# Patient Record
Sex: Female | Born: 1935 | Race: White | Hispanic: No | Smoking: Former smoker
Health system: Southern US, Community
[De-identification: ages and names within clinical notes are randomized; demographics above are authoritative.]

## PROBLEM LIST (undated history)

## (undated) DIAGNOSIS — D099 Carcinoma in situ, unspecified: Secondary | ICD-10-CM

## (undated) DIAGNOSIS — K219 Gastro-esophageal reflux disease without esophagitis: Secondary | ICD-10-CM

## (undated) DIAGNOSIS — I10 Essential (primary) hypertension: Secondary | ICD-10-CM

## (undated) DIAGNOSIS — R350 Frequency of micturition: Secondary | ICD-10-CM

## (undated) DIAGNOSIS — G4700 Insomnia, unspecified: Secondary | ICD-10-CM

## (undated) DIAGNOSIS — N8110 Cystocele, unspecified: Secondary | ICD-10-CM

## (undated) DIAGNOSIS — C439 Malignant melanoma of skin, unspecified: Secondary | ICD-10-CM

## (undated) DIAGNOSIS — H269 Unspecified cataract: Secondary | ICD-10-CM

## (undated) DIAGNOSIS — M1990 Unspecified osteoarthritis, unspecified site: Secondary | ICD-10-CM

## (undated) DIAGNOSIS — N3020 Other chronic cystitis without hematuria: Secondary | ICD-10-CM

## (undated) DIAGNOSIS — C801 Malignant (primary) neoplasm, unspecified: Secondary | ICD-10-CM

## (undated) HISTORY — DX: Malignant (primary) neoplasm, unspecified: C80.1

## (undated) HISTORY — DX: Essential (primary) hypertension: I10

## (undated) HISTORY — DX: Other chronic cystitis without hematuria: N30.20

## (undated) HISTORY — PX: BREAST EXCISIONAL BIOPSY: SUR124

## (undated) HISTORY — DX: Frequency of micturition: R35.0

## (undated) HISTORY — PX: CATARACT EXTRACTION: SUR2

## (undated) HISTORY — DX: Carcinoma in situ, unspecified: D09.9

## (undated) HISTORY — DX: Malignant melanoma of skin, unspecified: C43.9

## (undated) HISTORY — DX: Insomnia, unspecified: G47.00

## (undated) HISTORY — DX: Cystocele, unspecified: N81.10

## (undated) HISTORY — DX: Unspecified osteoarthritis, unspecified site: M19.90

## (undated) HISTORY — DX: Gastro-esophageal reflux disease without esophagitis: K21.9

## (undated) HISTORY — PX: TUBAL LIGATION: SHX77

## (undated) HISTORY — PX: LAPAROSCOPIC VAGINAL HYSTERECTOMY WITH SALPINGO OOPHORECTOMY: SHX6681

## (undated) HISTORY — PX: APPENDECTOMY: SHX54

## (undated) HISTORY — DX: Unspecified cataract: H26.9

---

## 2011-03-11 DIAGNOSIS — C801 Malignant (primary) neoplasm, unspecified: Secondary | ICD-10-CM

## 2011-03-11 HISTORY — PX: BREAST SURGERY: SHX581

## 2011-03-11 HISTORY — DX: Malignant (primary) neoplasm, unspecified: C80.1

## 2011-12-16 DIAGNOSIS — N815 Vaginal enterocele: Secondary | ICD-10-CM | POA: Insufficient documentation

## 2012-03-18 DIAGNOSIS — N393 Stress incontinence (female) (male): Secondary | ICD-10-CM | POA: Insufficient documentation

## 2012-11-05 DIAGNOSIS — Z853 Personal history of malignant neoplasm of breast: Secondary | ICD-10-CM | POA: Insufficient documentation

## 2012-11-05 DIAGNOSIS — C50511 Malignant neoplasm of lower-outer quadrant of right female breast: Secondary | ICD-10-CM | POA: Insufficient documentation

## 2013-05-19 DIAGNOSIS — Z8742 Personal history of other diseases of the female genital tract: Secondary | ICD-10-CM | POA: Insufficient documentation

## 2016-04-16 DIAGNOSIS — M9901 Segmental and somatic dysfunction of cervical region: Secondary | ICD-10-CM | POA: Diagnosis not present

## 2016-04-16 DIAGNOSIS — M9902 Segmental and somatic dysfunction of thoracic region: Secondary | ICD-10-CM | POA: Diagnosis not present

## 2016-04-16 DIAGNOSIS — M47816 Spondylosis without myelopathy or radiculopathy, lumbar region: Secondary | ICD-10-CM | POA: Diagnosis not present

## 2016-04-16 DIAGNOSIS — M9903 Segmental and somatic dysfunction of lumbar region: Secondary | ICD-10-CM | POA: Diagnosis not present

## 2016-04-22 DIAGNOSIS — M205X1 Other deformities of toe(s) (acquired), right foot: Secondary | ICD-10-CM | POA: Diagnosis not present

## 2016-04-22 DIAGNOSIS — L918 Other hypertrophic disorders of the skin: Secondary | ICD-10-CM | POA: Diagnosis not present

## 2016-06-09 DIAGNOSIS — L820 Inflamed seborrheic keratosis: Secondary | ICD-10-CM | POA: Diagnosis not present

## 2016-06-09 DIAGNOSIS — D0439 Carcinoma in situ of skin of other parts of face: Secondary | ICD-10-CM | POA: Diagnosis not present

## 2016-06-09 DIAGNOSIS — D229 Melanocytic nevi, unspecified: Secondary | ICD-10-CM | POA: Diagnosis not present

## 2016-06-09 DIAGNOSIS — D485 Neoplasm of uncertain behavior of skin: Secondary | ICD-10-CM | POA: Diagnosis not present

## 2016-06-09 DIAGNOSIS — D225 Melanocytic nevi of trunk: Secondary | ICD-10-CM | POA: Diagnosis not present

## 2016-06-09 DIAGNOSIS — L309 Dermatitis, unspecified: Secondary | ICD-10-CM | POA: Diagnosis not present

## 2016-06-09 DIAGNOSIS — L821 Other seborrheic keratosis: Secondary | ICD-10-CM | POA: Diagnosis not present

## 2016-06-09 DIAGNOSIS — Z85828 Personal history of other malignant neoplasm of skin: Secondary | ICD-10-CM | POA: Diagnosis not present

## 2016-06-09 DIAGNOSIS — Z08 Encounter for follow-up examination after completed treatment for malignant neoplasm: Secondary | ICD-10-CM | POA: Diagnosis not present

## 2016-06-09 DIAGNOSIS — L570 Actinic keratosis: Secondary | ICD-10-CM | POA: Diagnosis not present

## 2016-06-24 DIAGNOSIS — L570 Actinic keratosis: Secondary | ICD-10-CM | POA: Diagnosis not present

## 2016-06-24 DIAGNOSIS — D485 Neoplasm of uncertain behavior of skin: Secondary | ICD-10-CM | POA: Diagnosis not present

## 2016-06-24 DIAGNOSIS — D0439 Carcinoma in situ of skin of other parts of face: Secondary | ICD-10-CM | POA: Diagnosis not present

## 2016-06-24 DIAGNOSIS — Z85828 Personal history of other malignant neoplasm of skin: Secondary | ICD-10-CM | POA: Diagnosis not present

## 2016-07-14 DIAGNOSIS — S1080XA Unspecified superficial injury of other specified part of neck, initial encounter: Secondary | ICD-10-CM | POA: Diagnosis not present

## 2016-07-14 DIAGNOSIS — Z09 Encounter for follow-up examination after completed treatment for conditions other than malignant neoplasm: Secondary | ICD-10-CM | POA: Diagnosis not present

## 2016-07-14 DIAGNOSIS — Z872 Personal history of diseases of the skin and subcutaneous tissue: Secondary | ICD-10-CM | POA: Diagnosis not present

## 2016-07-14 DIAGNOSIS — D485 Neoplasm of uncertain behavior of skin: Secondary | ICD-10-CM | POA: Diagnosis not present

## 2016-07-14 DIAGNOSIS — L570 Actinic keratosis: Secondary | ICD-10-CM | POA: Diagnosis not present

## 2016-08-07 DIAGNOSIS — M9901 Segmental and somatic dysfunction of cervical region: Secondary | ICD-10-CM | POA: Diagnosis not present

## 2016-08-07 DIAGNOSIS — M9903 Segmental and somatic dysfunction of lumbar region: Secondary | ICD-10-CM | POA: Diagnosis not present

## 2016-08-07 DIAGNOSIS — M47816 Spondylosis without myelopathy or radiculopathy, lumbar region: Secondary | ICD-10-CM | POA: Diagnosis not present

## 2016-08-07 DIAGNOSIS — M9902 Segmental and somatic dysfunction of thoracic region: Secondary | ICD-10-CM | POA: Diagnosis not present

## 2016-08-26 DIAGNOSIS — Z6822 Body mass index (BMI) 22.0-22.9, adult: Secondary | ICD-10-CM | POA: Diagnosis not present

## 2016-08-26 DIAGNOSIS — I10 Essential (primary) hypertension: Secondary | ICD-10-CM | POA: Diagnosis not present

## 2016-08-26 DIAGNOSIS — K209 Esophagitis, unspecified: Secondary | ICD-10-CM | POA: Diagnosis not present

## 2016-08-26 DIAGNOSIS — R194 Change in bowel habit: Secondary | ICD-10-CM | POA: Diagnosis not present

## 2016-10-06 DIAGNOSIS — L740 Miliaria rubra: Secondary | ICD-10-CM | POA: Diagnosis not present

## 2016-10-06 DIAGNOSIS — R21 Rash and other nonspecific skin eruption: Secondary | ICD-10-CM | POA: Diagnosis not present

## 2016-10-31 ENCOUNTER — Ambulatory Visit (INDEPENDENT_AMBULATORY_CARE_PROVIDER_SITE_OTHER): Payer: Medicare HMO | Admitting: Family Medicine

## 2016-10-31 ENCOUNTER — Encounter: Payer: Self-pay | Admitting: Family Medicine

## 2016-10-31 VITALS — BP 120/74 | HR 80 | Temp 98.1°F | Resp 16 | Ht 61.0 in | Wt 113.0 lb

## 2016-10-31 DIAGNOSIS — C50511 Malignant neoplasm of lower-outer quadrant of right female breast: Secondary | ICD-10-CM | POA: Diagnosis not present

## 2016-10-31 DIAGNOSIS — Z23 Encounter for immunization: Secondary | ICD-10-CM

## 2016-10-31 DIAGNOSIS — E559 Vitamin D deficiency, unspecified: Secondary | ICD-10-CM | POA: Diagnosis not present

## 2016-10-31 DIAGNOSIS — Z0000 Encounter for general adult medical examination without abnormal findings: Secondary | ICD-10-CM | POA: Diagnosis not present

## 2016-10-31 DIAGNOSIS — I10 Essential (primary) hypertension: Secondary | ICD-10-CM

## 2016-10-31 DIAGNOSIS — K219 Gastro-esophageal reflux disease without esophagitis: Secondary | ICD-10-CM | POA: Diagnosis not present

## 2016-10-31 MED ORDER — OMEPRAZOLE 20 MG PO CPDR: 20.0000 mg | DELAYED_RELEASE_CAPSULE | Freq: Every day | ORAL | 2 refills | Status: DC

## 2016-10-31 MED ORDER — HYDROCHLOROTHIAZIDE 25 MG PO TABS: 25.0000 mg | ORAL_TABLET | Freq: Every day | ORAL | 2 refills | Status: DC

## 2016-10-31 NOTE — Assessment & Plan Note (Signed)
Recheck vitamin  D level today.  

## 2016-10-31 NOTE — Assessment & Plan Note (Signed)
Annual screening mammogram scheduled for next months Results will be sent

## 2016-10-31 NOTE — Patient Instructions (Signed)
Preventive Care 65 Years and Older, Female Preventive care refers to lifestyle choices and visits with your health care provider that can promote health and wellness. What does preventive care include?  A yearly physical exam. This is also called an annual well check.  Dental exams once or twice a year.  Routine eye exams. Ask your health care provider how often you should have your eyes checked.  Personal lifestyle choices, including: ? Daily care of your teeth and gums. ? Regular physical activity. ? Eating a healthy diet. ? Avoiding tobacco and drug use. ? Limiting alcohol use. ? Practicing safe sex. ? Taking low-dose aspirin every day. ? Taking vitamin and mineral supplements as recommended by your health care provider. What happens during an annual well check? The services and screenings done by your health care provider during your annual well check will depend on your age, overall health, lifestyle risk factors, and family history of disease. Counseling Your health care provider may ask you questions about your:  Alcohol use.  Tobacco use.  Drug use.  Emotional well-being.  Home and relationship well-being.  Sexual activity.  Eating habits.  History of falls.  Memory and ability to understand (cognition).  Work and work environment.  Reproductive health.  Screening You may have the following tests or measurements:  Height, weight, and BMI.  Blood pressure.  Lipid and cholesterol levels. These may be checked every 5 years, or more frequently if you are over 50 years old.  Skin check.  Lung cancer screening. You may have this screening every year starting at age 55 if you have a 30-pack-year history of smoking and currently smoke or have quit within the past 15 years.  Fecal occult blood test (FOBT) of the stool. You may have this test every year starting at age 50.  Flexible sigmoidoscopy or colonoscopy. You may have a sigmoidoscopy every 5 years or  a colonoscopy every 10 years starting at age 50.  Hepatitis C blood test.  Hepatitis B blood test.  Sexually transmitted disease (STD) testing.  Diabetes screening. This is done by checking your blood sugar (glucose) after you have not eaten for a while (fasting). You may have this done every 1-3 years.  Bone density scan. This is done to screen for osteoporosis. You may have this done starting at age 65.  Mammogram. This may be done every 1-2 years. Talk to your health care provider about how often you should have regular mammograms.  Talk with your health care provider about your test results, treatment options, and if necessary, the need for more tests. Vaccines Your health care provider may recommend certain vaccines, such as:  Influenza vaccine. This is recommended every year.  Tetanus, diphtheria, and acellular pertussis (Tdap, Td) vaccine. You may need a Td booster every 10 years.  Varicella vaccine. You may need this if you have not been vaccinated.  Zoster vaccine. You may need this after age 60.  Measles, mumps, and rubella (MMR) vaccine. You may need at least one dose of MMR if you were born in 1957 or later. You may also need a second dose.  Pneumococcal 13-valent conjugate (PCV13) vaccine. One dose is recommended after age 65.  Pneumococcal polysaccharide (PPSV23) vaccine. One dose is recommended after age 65.  Meningococcal vaccine. You may need this if you have certain conditions.  Hepatitis A vaccine. You may need this if you have certain conditions or if you travel or work in places where you may be exposed to hepatitis   A.  Hepatitis B vaccine. You may need this if you have certain conditions or if you travel or work in places where you may be exposed to hepatitis B.  Haemophilus influenzae type b (Hib) vaccine. You may need this if you have certain conditions.  Talk to your health care provider about which screenings and vaccines you need and how often you  need them. This information is not intended to replace advice given to you by your health care provider. Make sure you discuss any questions you have with your health care provider. Document Released: 03/23/2015 Document Revised: 11/14/2015 Document Reviewed: 12/26/2014 Elsevier Interactive Patient Education  2017 [employer].

## 2016-10-31 NOTE — Assessment & Plan Note (Signed)
Well-controlled Continue current medications CMP and screening lipid panel today

## 2016-10-31 NOTE — Progress Notes (Signed)
Patient: Brandy Wong, Female    DOB: [birth year], 82 y.o.   MRN: [id number] Visit Date: 10/31/2016  Today's Provider: Lavon Paganini, MD   Chief Complaint  Patient presents with  . Medicare Wellness   Subjective:    Annual wellness visit Brandy Wong is a 82 y.o. female. She is here to establish care, as well as an annual wellness visit. She recently moved to the area from [place]. Her previous caregiver was Dr. Clyda Greener at [facility]. She has a H/O breast cancer, HTN, GERD. She feels well. She reports she is not exercising regularly, but she plans to start again. She does play golf. She reports she is sleeping poorly. She is having to use Tylenol PM to sleep, and when she does not take the medicine, she has difficulty falling asleep and premature awakening.   Last mammogram- 11/23/2015- BI-RADS 1. She has mammogram scheduled next month at [facility].  -----------------------------------------------------------   Review of Systems  Constitutional: Negative.   HENT: Negative.   Eyes: Negative.   Respiratory: Negative.   Cardiovascular: Negative.   Gastrointestinal: Negative.   Endocrine: Negative.   Genitourinary: Negative.   Musculoskeletal: Negative.   Skin: Negative.   Allergic/Immunologic: Negative.   Neurological: Negative.   Hematological: Negative.   Psychiatric/Behavioral: Negative.     Social History   Social History  . Marital status: Widowed    Spouse name: N/A  . Number of children: 2  . Years of education: some college   Occupational History  .  Retired   Social History Main Topics  . Smoking status: Former Smoker    Packs/day: 0.25    Years: 10.00    Quit date: 03/10/1987  . Smokeless tobacco: Never Used  . Alcohol use 4.2 oz/week    7 Glasses of wine per week  . Drug use: No  . Sexual activity: No   Other Topics Concern  . Not on file   Social History Narrative  . No narrative on file    Past Medical  History:  Diagnosis Date  . Cancer (HCC)    breast  . GERD (gastroesophageal reflux disease)   . Hypertension      Patient Active Problem List   Diagnosis Date Noted  . History of uterine prolapse 05/19/2013  . Malignant neoplasm of lower-outer quadrant of right female breast ([place]) 11/05/2012    Past Surgical History:  Procedure Laterality Date  . ABDOMINAL HYSTERECTOMY    . APPENDECTOMY    . BREAST SURGERY    . CESAREAN SECTION    . TUBAL LIGATION      Her family history includes Dementia in her mother; GER disease in her mother; Heart attack in her father; Stroke in her father.      Current Outpatient Prescriptions:  .  Cholecalciferol (VITAMIN D-1000 MAX ST) 1000 units tablet, Take 1 tablet by mouth daily., Disp: , Rfl:  .  hydrochlorothiazide (HYDRODIURIL) 25 MG tablet, Take 1 tablet by mouth daily., Disp: , Rfl:  .  MAGNESIUM PO, Take 1 capsule by mouth daily., Disp: , Rfl:  .  omeprazole (PRILOSEC) 20 MG capsule, Take 20 mg by mouth every other day. , Disp: , Rfl:  .  TURMERIC PO, Take 1 tablet by mouth daily., Disp: , Rfl:   Patient Care Team: Virginia Crews, MD as PCP - General (Family Medicine)     Objective:   Vitals: BP 120/74 (BP Location: Left Arm, Patient Position: Sitting, Cuff  Size: Normal)   Pulse 80   Temp 98.1 F (36.7 C) (Oral)   Resp 16   Ht 5\' 1"  (1.549 m)   Wt 113 lb (51.3 kg)   BMI 21.35 kg/m   Physical Exam  Constitutional: She is oriented to person, place, and time. She appears well-developed and well-nourished. No distress.  HENT:  Head: Normocephalic and atraumatic.  Right Ear: External ear normal.  Left Ear: External ear normal.  Nose: Nose normal.  Mouth/Throat: Oropharynx is clear and moist.  Eyes: Pupils are equal, round, and reactive to light. Conjunctivae and EOM are normal. No scleral icterus.  Neck: Neck supple. No thyromegaly present.  Cardiovascular: Normal rate, regular rhythm, normal heart sounds and intact  distal pulses.   No murmur heard. Pulmonary/Chest: Effort normal and breath sounds normal. No respiratory distress. She has no wheezes. She has no rales.  Abdominal: Soft. Bowel sounds are normal. She exhibits no distension. There is no tenderness. There is no rebound and no guarding.  Musculoskeletal: She exhibits no edema or deformity.  Lymphadenopathy:    She has no cervical adenopathy.  Neurological: She is alert and oriented to person, place, and time.  Skin: Skin is warm and dry. No rash noted.  Psychiatric: She has a normal mood and affect. Her behavior is normal.  Vitals reviewed.   Activities of Daily Living In your present state of health, do you have any difficulty performing the following activities: 10/31/2016  Hearing? N  Vision? N  Difficulty concentrating or making decisions? N  Walking or climbing stairs? N  Dressing or bathing? N  Doing errands, shopping? N    Fall Risk Assessment Fall Risk  10/31/2016  Falls in the past year? No     Depression Screen PHQ 2/9 Scores 10/31/2016  PHQ - 2 Score 0    Cognitive Testing - 6-CIT  Correct? Score   What year is it? yes 0 0 or 4  What month is it? yes 0 0 or 3  Memorize:    Pia Mau,  44,  [place],      What time is it? (within 1 hour) yes 0 0 or 3  Count backwards from 20 yes 0 0, 2, or 4  Name the months of the year yes 0 0, 2, or 4  Repeat name & address above no 4 0, 2, 4, 6, 8, or 10       TOTAL SCORE  4/28   Interpretation:  Normal  Normal (0-7) Abnormal (8-28)    Audit-C Alcohol Use Screening  Question Answer Points  How often do you have alcoholic drink? 1 times daily 1  On days you do drink alcohol, how many drinks do you typically consume? 1 1  How oftey will you drink 6 or more in a total? never 0  Total Score:  2   A score of 3 or more in women, and 4 or more in men indicates increased risk for alcohol abuse, EXCEPT if all of the points are from question 1.     Assessment &  Plan:     Annual Wellness Visit  Reviewed patient's Family Medical History Reviewed and updated list of patient's medical providers Assessment of cognitive impairment was done Assessed patient's functional ability Established a written schedule for health screening [place] Completed and Reviewed  Exercise Activities and Dietary recommendations Goals    None      Immunization History  Administered Date(s) Administered  .  Pneumococcal Conjugate-13 11/22/2014    Health Maintenance  Topic Date Due  . TETANUS/TDAP  01/12/1956  . DEXA SCAN  01/11/2002  . PNA vac Low Risk Adult (1 of 2 - PCV13) 01/11/2002  . INFLUENZA VACCINE  10/08/2016     Discussed health benefits of physical activity, and encouraged her to engage in regular exercise appropriate for her age and condition.   HTN (hypertension) Well-controlled Continue current medications CMP and screening lipid panel today  GERD (gastroesophageal reflux disease) Well-controlled on omeprazole 20 mg daily (sometimes only every other day) No changes in medications at this time Warned about potential for osteoporosis and renal dysfunction on long-term PPI  Avitaminosis D Recheck vitamin D level today  Malignant neoplasm of lower-outer quadrant of right female breast [facility]) Annual screening mammogram scheduled for next months Results will be sent    Influenza vaccine given today  ------------------------------------------------------------------------------------------------------------  The entirety of the information documented in the History of Present Illness, Review of Systems and Physical Exam were personally obtained by me. Portions of this information were initially documented by Raquel Sarna Ratchford, CMA and reviewed by me for thoroughness and accuracy.    Lavon Paganini, MD  [facility]

## 2016-10-31 NOTE — Assessment & Plan Note (Signed)
Well-controlled on omeprazole 20 mg daily (sometimes only every other day) No changes in medications at this time Warned about potential for osteoporosis and renal dysfunction on long-term PPI

## 2016-11-01 LAB — COMPREHENSIVE METABOLIC PANEL
ALT: 12 IU/L (ref 0–32)
AST: 24 IU/L (ref 0–40)
Albumin/Globulin Ratio: 1.8 (ref 1.2–2.2)
Albumin: 4.5 g/dL (ref 3.5–4.8)
Alkaline Phosphatase: 83 IU/L (ref 39–117)
BUN/Creatinine Ratio: 15 (ref 12–28)
BUN: 9 mg/dL (ref 8–27)
Bilirubin Total: 0.4 mg/dL (ref 0.0–1.2)
CO2: 27 mmol/L (ref 20–29)
Calcium: 10.2 mg/dL (ref 8.7–10.3)
Chloride: 94 mmol/L — ABNORMAL LOW (ref 96–106)
Creatinine, Ser: 0.62 mg/dL (ref 0.57–1.00)
GFR calc Af Amer: 99 mL/min/{1.73_m2} (ref >59)
GFR calc non Af Amer: 86 mL/min/{1.73_m2} (ref >59)
Globulin, Total: 2.5 g/dL (ref 1.5–4.5)
Glucose: 95 mg/dL (ref 65–99)
Potassium: 4.4 mmol/L (ref 3.5–5.2)
Sodium: 137 mmol/L (ref 134–144)
Total Protein: 7 g/dL (ref 6.0–8.5)

## 2016-11-01 LAB — LIPID PANEL
CHOL/HDL RATIO: 2.7 ratio (ref 0.0–4.4)
CHOLESTEROL TOTAL: 286 mg/dL — AB (ref 100–199)
HDL: 107 mg/dL (ref >39)
LDL CALC: 162 mg/dL — AB (ref 0–99)
TRIGLYCERIDES: 87 mg/dL (ref 0–149)
VLDL Cholesterol Cal: 17 mg/dL (ref 5–40)

## 2016-11-01 LAB — CBC WITH DIFFERENTIAL/PLATELET
Basophils Absolute: 0.1 10*3/uL (ref 0.0–0.2)
Basos: 1 %
EOS (ABSOLUTE): 0.1 10*3/uL (ref 0.0–0.4)
Eos: 2 %
Hematocrit: 43.2 % (ref 34.0–46.6)
Hemoglobin: 14.4 g/dL (ref 11.1–15.9)
Immature Grans (Abs): 0 10*3/uL (ref 0.0–0.1)
Immature Granulocytes: 0 %
Lymphocytes Absolute: 1.6 10*3/uL (ref 0.7–3.1)
Lymphs: 24 %
MCH: 31 pg (ref 26.6–33.0)
MCHC: 33.3 g/dL (ref 31.5–35.7)
MCV: 93 fL (ref 79–97)
Monocytes Absolute: 0.7 10*3/uL (ref 0.1–0.9)
Monocytes: 10 %
Neutrophils Absolute: 4.3 10*3/uL (ref 1.4–7.0)
Neutrophils: 63 %
Platelets: 417 10*3/uL — ABNORMAL HIGH (ref 150–379)
RBC: 4.65 x10E6/uL (ref 3.77–5.28)
RDW: 13.7 % (ref 12.3–15.4)
WBC: 6.8 10*3/uL (ref 3.4–10.8)

## 2016-11-01 LAB — VITAMIN D 25 HYDROXY (VIT D DEFICIENCY, FRACTURES): Vit D, 25-Hydroxy: 38.2 ng/mL (ref 30.0–100.0)

## 2016-11-03 ENCOUNTER — Telehealth: Payer: Self-pay

## 2016-11-03 NOTE — Telephone Encounter (Signed)
-----   Message from Virginia Crews, MD sent at 11/03/2016 10:49 AM EDT ----- Cholesterol is high, but good cholesterol is also quite high which is protective.  10 yr risk of heart attack/stroke is 29.9%.  This means we should at least consider a statin medicine for cholesterol.  The 10 year risk only decreases to 22.4% even with a statin, however.  This is mostly driven by age.  I am happy to hold off on one or start one and will leave decision up to the patient.    Other labs are great, including electrolytes, kidney function, liver function, blood counts, and Vit D levels.  Virginia Crews, MD, MPH [facility] 11/03/2016 10:49 AM

## 2016-11-03 NOTE — Telephone Encounter (Signed)
Tried calling pt. No answer and no VM. Will try again later.

## 2016-11-03 NOTE — Telephone Encounter (Signed)
Pt advised as below, and declines medication at this time.

## 2016-12-11 DIAGNOSIS — R922 Inconclusive mammogram: Secondary | ICD-10-CM | POA: Diagnosis not present

## 2016-12-11 DIAGNOSIS — Z1231 Encounter for screening mammogram for malignant neoplasm of breast: Secondary | ICD-10-CM | POA: Diagnosis not present

## 2016-12-11 DIAGNOSIS — M9903 Segmental and somatic dysfunction of lumbar region: Secondary | ICD-10-CM | POA: Diagnosis not present

## 2016-12-11 DIAGNOSIS — Z853 Personal history of malignant neoplasm of breast: Secondary | ICD-10-CM | POA: Diagnosis not present

## 2016-12-11 DIAGNOSIS — M9902 Segmental and somatic dysfunction of thoracic region: Secondary | ICD-10-CM | POA: Diagnosis not present

## 2016-12-11 DIAGNOSIS — M9901 Segmental and somatic dysfunction of cervical region: Secondary | ICD-10-CM | POA: Diagnosis not present

## 2016-12-11 DIAGNOSIS — M47816 Spondylosis without myelopathy or radiculopathy, lumbar region: Secondary | ICD-10-CM | POA: Diagnosis not present

## 2016-12-11 LAB — HM MAMMOGRAPHY

## 2016-12-15 ENCOUNTER — Encounter: Payer: Self-pay | Admitting: Family Medicine

## 2016-12-15 NOTE — Progress Notes (Signed)
a 

## 2016-12-24 DIAGNOSIS — M9902 Segmental and somatic dysfunction of thoracic region: Secondary | ICD-10-CM | POA: Diagnosis not present

## 2016-12-24 DIAGNOSIS — M47816 Spondylosis without myelopathy or radiculopathy, lumbar region: Secondary | ICD-10-CM | POA: Diagnosis not present

## 2016-12-24 DIAGNOSIS — M9903 Segmental and somatic dysfunction of lumbar region: Secondary | ICD-10-CM | POA: Diagnosis not present

## 2016-12-24 DIAGNOSIS — M9901 Segmental and somatic dysfunction of cervical region: Secondary | ICD-10-CM | POA: Diagnosis not present

## 2016-12-25 DIAGNOSIS — I10 Essential (primary) hypertension: Secondary | ICD-10-CM | POA: Diagnosis not present

## 2016-12-25 DIAGNOSIS — H524 Presbyopia: Secondary | ICD-10-CM | POA: Diagnosis not present

## 2017-01-19 ENCOUNTER — Ambulatory Visit (INDEPENDENT_AMBULATORY_CARE_PROVIDER_SITE_OTHER): Payer: Medicare HMO | Admitting: Family Medicine

## 2017-01-19 ENCOUNTER — Encounter: Payer: Self-pay | Admitting: Family Medicine

## 2017-01-19 VITALS — BP 136/80 | HR 84 | Temp 98.3°F | Resp 16 | Wt 117.0 lb

## 2017-01-19 DIAGNOSIS — M79602 Pain in left arm: Secondary | ICD-10-CM | POA: Diagnosis not present

## 2017-01-19 DIAGNOSIS — Z23 Encounter for immunization: Secondary | ICD-10-CM

## 2017-01-19 DIAGNOSIS — S81802A Unspecified open wound, left lower leg, initial encounter: Secondary | ICD-10-CM

## 2017-01-19 DIAGNOSIS — W548XXA Other contact with dog, initial encounter: Secondary | ICD-10-CM | POA: Diagnosis not present

## 2017-01-19 DIAGNOSIS — L03116 Cellulitis of left lower limb: Secondary | ICD-10-CM | POA: Diagnosis not present

## 2017-01-19 MED ORDER — DOXYCYCLINE HYCLATE 100 MG PO TABS
100.0000 mg | ORAL_TABLET | Freq: Two times a day (BID) | ORAL | 0 refills | Status: DC
Start: 2017-01-19 — End: 2017-11-03

## 2017-01-19 NOTE — Patient Instructions (Signed)

## 2017-01-19 NOTE — Progress Notes (Signed)
Patient: Brandy Wong Female    DOB: 1935/03/01   82 y.o.   MRN: [id number] Visit Date: 01/19/2017  Today's Provider: Lavon Paganini, MD   Chief Complaint  Patient presents with  . Arm Pain  . Wound Check   Subjective:    Arm Pain   Injury mechanism: picking up table 2 months ago. The pain is present in the left elbow. The quality of the pain is described as aching. Radiates to: left shoulder. The pain is at a severity of 1/10. The pain is mild. The pain has been fluctuating since the incident. Pertinent negatives include no chest pain, muscle weakness, numbness or tingling. The symptoms are aggravated by movement. She has tried NSAIDs, heat and ice (chiropractic manipulation) for the symptoms. The treatment provided mild relief.  Wound Check  There has been no drainage from the wound. The redness has improved. There is no swelling present. There is no pain present.  Wound located on LLE. Occurred after dog scratch 2 weeks ago. Denies pain.  Dog is hers and fully vaccinated     Allergies  Allergen Reactions  . Amoxicillin Rash     Current Outpatient Medications:  .  Cholecalciferol (VITAMIN D-1000 MAX ST) 1000 units tablet, Take 1 tablet by mouth daily., Disp: , Rfl:  .  hydrochlorothiazide (HYDRODIURIL) 25 MG tablet, Take 1 tablet (25 mg total) by mouth daily., Disp: 90 tablet, Rfl: 2 .  MAGNESIUM PO, Take 1 capsule by mouth daily., Disp: , Rfl:  .  omeprazole (PRILOSEC) 20 MG capsule, Take 1 capsule (20 mg total) by mouth daily., Disp: 90 capsule, Rfl: 2 .  TURMERIC PO, Take 1 tablet by mouth daily., Disp: , Rfl:    Review of Systems  Constitutional: Negative.   HENT: Negative.   Respiratory: Negative.   Cardiovascular: Negative for chest pain, palpitations and leg swelling.  Musculoskeletal: Positive for arthralgias. Negative for joint swelling.  Skin: Negative.   Neurological: Negative for tingling and numbness.    Social History   Tobacco Use  .  Smoking status: Former Smoker    Packs/day: 0.15    Years: 10.00    Pack years: 1.50    Last attempt to quit: 03/10/1987    Years since quitting: 29.8  . Smokeless tobacco: Never Used  Substance Use Topics  . Alcohol use: Yes    Alcohol/week: 4.2 oz    Types: 7 Glasses of wine per week   Objective:   BP 136/80 (BP Location: Right Arm, Patient Position: Sitting, Cuff Size: Normal)   Pulse 84   Temp 98.3 F (36.8 C) (Oral)   Resp 16   Wt 117 lb (53.1 kg)   BMI 22.11 kg/m  Vitals:   01/19/17 1116  BP: 136/80  Pulse: 84  Resp: 16  Temp: 98.3 F (36.8 C)  TempSrc: Oral  Weight: 117 lb (53.1 kg)     Physical Exam  Constitutional: She is oriented to person, place, and time. She appears well-developed and well-nourished. No distress.  HENT:  Head: Normocephalic and atraumatic.  Right Ear: External ear normal.  Left Ear: External ear normal.  Nose: Nose normal.  Mouth/Throat: Oropharynx is clear and moist.  Eyes: Conjunctivae and EOM are normal. Pupils are equal, round, and reactive to light. No scleral icterus.  Neck: Neck supple. No thyromegaly present.  Cardiovascular: Normal rate, regular rhythm, normal heart sounds and intact distal pulses.  No murmur heard. Pulmonary/Chest: Effort normal and breath sounds normal.  No respiratory distress. She has no wheezes. She has no rales.  Musculoskeletal: She exhibits no edema.  L shoulder: Full ROM, no TTP over bony landmarks. Deformity noted over L posterior upper arm that is TTP. Strength and sensation itnact. No signs of impingement  Lymphadenopathy:    She has no cervical adenopathy.  Neurological: She is alert and oriented to person, place, and time.  Skin:  Scab over L medial calf with surrounding erythema, no fluctuance/induration  Vitals reviewed.       Assessment & Plan:     1. Left arm pain - noted deformity of soft tissue on exam.  Likely this represents tendon tear of one of triceps tendons - discussed Ortho  referral for further management, but patient prefers to wait as she would not want intervention at this time - discussed MRI to further evaluate, but she does not think this would change her management, as she is currently functioning well  2. Dog scratch - slow to heal wound 2/2 dog scratch with surrounding cellulitis - Td vaccine greater than or equal to 7yo preservative free IM  3. Wound of left lower extremity, initial encounter - Td vaccine greater than or equal to 7yo preservative free IM  4. Need for pneumococcal vaccination - Pneumococcal polysaccharide vaccine 23-valent greater than or equal to 2yo subcutaneous/IM  5. Cellulitis of left lower extremity - appears to have surrounding cellulitis - treat with 7 day course of doxycycline - return precautions discussed   Meds ordered this encounter  Medications  . doxycycline (VIBRA-TABS) 100 MG tablet    Sig: Take 1 tablet (100 mg total) 2 (two) times daily by mouth.    Dispense:  14 tablet    Refill:  0       Return if symptoms worsen or fail to improve.  The entirety of the information documented in the History of Present Illness, Review of Systems and Physical Exam were personally obtained by me. Portions of this information were initially documented by Raquel Sarna Ratchford, CMA and reviewed by me for thoroughness and accuracy.     Lavon Paganini, MD  [facility]

## 2017-03-09 DIAGNOSIS — X32XXXA Exposure to sunlight, initial encounter: Secondary | ICD-10-CM | POA: Diagnosis not present

## 2017-03-09 DIAGNOSIS — D225 Melanocytic nevi of trunk: Secondary | ICD-10-CM | POA: Diagnosis not present

## 2017-03-09 DIAGNOSIS — D2262 Melanocytic nevi of left upper limb, including shoulder: Secondary | ICD-10-CM | POA: Diagnosis not present

## 2017-03-09 DIAGNOSIS — L570 Actinic keratosis: Secondary | ICD-10-CM | POA: Diagnosis not present

## 2017-03-09 DIAGNOSIS — D2261 Melanocytic nevi of right upper limb, including shoulder: Secondary | ICD-10-CM | POA: Diagnosis not present

## 2017-03-09 DIAGNOSIS — Z85828 Personal history of other malignant neoplasm of skin: Secondary | ICD-10-CM | POA: Diagnosis not present

## 2017-03-18 DIAGNOSIS — M7552 Bursitis of left shoulder: Secondary | ICD-10-CM | POA: Diagnosis not present

## 2017-03-18 DIAGNOSIS — M7542 Impingement syndrome of left shoulder: Secondary | ICD-10-CM | POA: Diagnosis not present

## 2017-04-06 DIAGNOSIS — M7542 Impingement syndrome of left shoulder: Secondary | ICD-10-CM | POA: Diagnosis not present

## 2017-04-06 DIAGNOSIS — R609 Edema, unspecified: Secondary | ICD-10-CM | POA: Diagnosis not present

## 2017-04-06 DIAGNOSIS — M25512 Pain in left shoulder: Secondary | ICD-10-CM | POA: Diagnosis not present

## 2017-04-15 DIAGNOSIS — M7542 Impingement syndrome of left shoulder: Secondary | ICD-10-CM | POA: Diagnosis not present

## 2017-04-15 DIAGNOSIS — M25512 Pain in left shoulder: Secondary | ICD-10-CM | POA: Diagnosis not present

## 2017-04-22 DIAGNOSIS — M7542 Impingement syndrome of left shoulder: Secondary | ICD-10-CM | POA: Diagnosis not present

## 2017-04-22 DIAGNOSIS — M25512 Pain in left shoulder: Secondary | ICD-10-CM | POA: Diagnosis not present

## 2017-07-28 DIAGNOSIS — M5134 Other intervertebral disc degeneration, thoracic region: Secondary | ICD-10-CM | POA: Diagnosis not present

## 2017-07-28 DIAGNOSIS — M5033 Other cervical disc degeneration, cervicothoracic region: Secondary | ICD-10-CM | POA: Diagnosis not present

## 2017-07-28 DIAGNOSIS — M9902 Segmental and somatic dysfunction of thoracic region: Secondary | ICD-10-CM | POA: Diagnosis not present

## 2017-07-28 DIAGNOSIS — M9901 Segmental and somatic dysfunction of cervical region: Secondary | ICD-10-CM | POA: Diagnosis not present

## 2017-07-30 DIAGNOSIS — M9901 Segmental and somatic dysfunction of cervical region: Secondary | ICD-10-CM | POA: Diagnosis not present

## 2017-07-30 DIAGNOSIS — M5134 Other intervertebral disc degeneration, thoracic region: Secondary | ICD-10-CM | POA: Diagnosis not present

## 2017-07-30 DIAGNOSIS — M9902 Segmental and somatic dysfunction of thoracic region: Secondary | ICD-10-CM | POA: Diagnosis not present

## 2017-07-30 DIAGNOSIS — M5033 Other cervical disc degeneration, cervicothoracic region: Secondary | ICD-10-CM | POA: Diagnosis not present

## 2017-08-05 DIAGNOSIS — M5134 Other intervertebral disc degeneration, thoracic region: Secondary | ICD-10-CM | POA: Diagnosis not present

## 2017-08-05 DIAGNOSIS — M9901 Segmental and somatic dysfunction of cervical region: Secondary | ICD-10-CM | POA: Diagnosis not present

## 2017-08-05 DIAGNOSIS — M5033 Other cervical disc degeneration, cervicothoracic region: Secondary | ICD-10-CM | POA: Diagnosis not present

## 2017-08-05 DIAGNOSIS — M9902 Segmental and somatic dysfunction of thoracic region: Secondary | ICD-10-CM | POA: Diagnosis not present

## 2017-08-17 DIAGNOSIS — M5134 Other intervertebral disc degeneration, thoracic region: Secondary | ICD-10-CM | POA: Diagnosis not present

## 2017-08-17 DIAGNOSIS — M9901 Segmental and somatic dysfunction of cervical region: Secondary | ICD-10-CM | POA: Diagnosis not present

## 2017-08-17 DIAGNOSIS — M5033 Other cervical disc degeneration, cervicothoracic region: Secondary | ICD-10-CM | POA: Diagnosis not present

## 2017-08-17 DIAGNOSIS — M9902 Segmental and somatic dysfunction of thoracic region: Secondary | ICD-10-CM | POA: Diagnosis not present

## 2017-08-21 DIAGNOSIS — M9901 Segmental and somatic dysfunction of cervical region: Secondary | ICD-10-CM | POA: Diagnosis not present

## 2017-08-21 DIAGNOSIS — M5134 Other intervertebral disc degeneration, thoracic region: Secondary | ICD-10-CM | POA: Diagnosis not present

## 2017-08-21 DIAGNOSIS — M9902 Segmental and somatic dysfunction of thoracic region: Secondary | ICD-10-CM | POA: Diagnosis not present

## 2017-08-21 DIAGNOSIS — M5033 Other cervical disc degeneration, cervicothoracic region: Secondary | ICD-10-CM | POA: Diagnosis not present

## 2017-10-13 DIAGNOSIS — M216X2 Other acquired deformities of left foot: Secondary | ICD-10-CM | POA: Diagnosis not present

## 2017-10-13 DIAGNOSIS — M216X1 Other acquired deformities of right foot: Secondary | ICD-10-CM | POA: Diagnosis not present

## 2017-10-13 DIAGNOSIS — L909 Atrophic disorder of skin, unspecified: Secondary | ICD-10-CM | POA: Diagnosis not present

## 2017-11-03 ENCOUNTER — Ambulatory Visit (INDEPENDENT_AMBULATORY_CARE_PROVIDER_SITE_OTHER): Payer: Medicare HMO | Admitting: Family Medicine

## 2017-11-03 ENCOUNTER — Encounter: Payer: Self-pay | Admitting: Family Medicine

## 2017-11-03 ENCOUNTER — Ambulatory Visit (INDEPENDENT_AMBULATORY_CARE_PROVIDER_SITE_OTHER): Payer: Medicare HMO

## 2017-11-03 VITALS — BP 132/76 | HR 91 | Temp 98.6°F | Ht 61.0 in | Wt 118.2 lb

## 2017-11-03 DIAGNOSIS — Z0000 Encounter for general adult medical examination without abnormal findings: Secondary | ICD-10-CM | POA: Diagnosis not present

## 2017-11-03 DIAGNOSIS — E559 Vitamin D deficiency, unspecified: Secondary | ICD-10-CM

## 2017-11-03 DIAGNOSIS — Z13820 Encounter for screening for osteoporosis: Secondary | ICD-10-CM

## 2017-11-03 DIAGNOSIS — I10 Essential (primary) hypertension: Secondary | ICD-10-CM

## 2017-11-03 DIAGNOSIS — K219 Gastro-esophageal reflux disease without esophagitis: Secondary | ICD-10-CM

## 2017-11-03 DIAGNOSIS — E7800 Pure hypercholesterolemia, unspecified: Secondary | ICD-10-CM

## 2017-11-03 MED ORDER — OMEPRAZOLE 20 MG PO CPDR: 20.0000 mg | DELAYED_RELEASE_CAPSULE | Freq: Every day | ORAL | 3 refills | Status: DC

## 2017-11-03 MED ORDER — HYDROCHLOROTHIAZIDE 25 MG PO TABS: 25.0000 mg | ORAL_TABLET | Freq: Every day | ORAL | 3 refills | Status: DC

## 2017-11-03 NOTE — Progress Notes (Signed)
Subjective:   Brandy Wong is a 83 y.o. female who presents for Medicare Annual (Subsequent) preventive examination.  Review of Systems:  N/A  Cardiac Risk Factors include: advanced age (>28men, >53 women);hypertension     Objective:     Vitals: BP 132/76 (BP Location: Right Arm)   Pulse 91   Temp 98.6 F (37 C) (Oral)   Ht 5\' 1"  (1.549 m)   Wt 118 lb 3.2 oz (53.6 kg)   BMI 22.33 kg/m   Body mass index is 22.33 kg/m.  Advanced Directives 11/03/2017 10/31/2016  Does Patient Have a Medical Advance Directive? Yes Yes  Type of Paramedic of [place];Living will [place];Living will  Copy of [place] in Chart? No - copy requested -    Tobacco Social History   Tobacco Use  Smoking Status Former Smoker  . Packs/day: 0.15  . Years: 10.00  . Pack years: 1.50  . Last attempt to quit: 03/10/1987  . Years since quitting: 30.6  Smokeless Tobacco Never Used     Counseling given: Not Answered   Clinical Intake:  Pre-visit preparation completed: Yes  Pain : No/denies pain Pain Score: 0-No pain(Has knee pain with movement.)     Nutritional Status: BMI of 19-24  Normal Nutritional Risks: None Diabetes: No  How often do you need to have someone help you when you read instructions, pamphlets, or other written materials from your doctor or pharmacy?: 1 - Never  Interpreter Needed?: No  Information entered by :: [facility], LPN  Past Medical History:  Diagnosis Date  . Cancer [facility]) 2013   R breast, s/p radiation and lumpectomy  . GERD (gastroesophageal reflux disease)   . Hypertension    Past Surgical History:  Procedure Laterality Date  . APPENDECTOMY    . BREAST SURGERY  2013   lumpectomy  . LAPAROSCOPIC VAGINAL HYSTERECTOMY WITH SALPINGO OOPHORECTOMY    . TUBAL LIGATION     Family History  Problem Relation Age of Onset  . GER disease Mother   . Dementia Mother 13  . Stroke Father   . Heart  attack Father    Social History   Socioeconomic History  . Marital status: Widowed    Spouse name: Not on file  . Number of children: 2  . Years of education: some college  . Highest education level: Some college, no degree  Occupational History    Employer: RETIRED  Social Needs  . Financial resource strain: Not hard at all  . Food insecurity:    Worry: Never true    Inability: Never true  . Transportation needs:    Medical: No    Non-medical: No  Tobacco Use  . Smoking status: Former Smoker    Packs/day: 0.15    Years: 10.00    Pack years: 1.50    Last attempt to quit: 03/10/1987    Years since quitting: 30.6  . Smokeless tobacco: Never Used  Substance and Sexual Activity  . Alcohol use: Yes    Alcohol/week: 7.0 standard drinks    Types: 7 Glasses of wine per week  . Drug use: No  . Sexual activity: Never  Lifestyle  . Physical activity:    Days per week: Not on file    Minutes per session: Not on file  . Stress: Not at all  Relationships  . Social connections:    Talks on phone: Not on file    Gets together: Not on file  Attends religious service: Not on file    Active member of club or organization: Not on file    Attends meetings of clubs or organizations: Not on file    Relationship status: Not on file  Other Topics Concern  . Not on file  Social History Narrative  . Not on file    Outpatient Encounter Medications as of 11/03/2017  Medication Sig  . Cholecalciferol (VITAMIN D-1000 MAX ST) 1000 units tablet Take 1 tablet by mouth daily.  . hydrochlorothiazide (HYDRODIURIL) 25 MG tablet Take 1 tablet (25 mg total) by mouth daily.  Marland Kitchen MAGNESIUM PO Take 1 capsule by mouth daily.  Marland Kitchen omeprazole (PRILOSEC) 20 MG capsule Take 1 capsule (20 mg total) by mouth daily. (Patient taking differently: Take 20 mg by mouth every other day. )  . Polyethyl Glycol-Propyl Glycol (SYSTANE OP) Apply 1 drop to eye 4 (four) times daily as needed.  . TURMERIC PO Take 1 tablet  by mouth daily.  Marland Kitchen doxycycline (VIBRA-TABS) 100 MG tablet Take 1 tablet (100 mg total) 2 (two) times daily by mouth. (Patient not taking: Reported on 11/03/2017)   No facility-administered encounter medications on file as of 11/03/2017.     Activities of Daily Living In your present state of health, do you have any difficulty performing the following activities: 11/03/2017  Hearing? N  Vision? Y  Comment Due to dry eyes.   Difficulty concentrating or making decisions? N  Walking or climbing stairs? Y  Comment Due to knee pains and weakness in legs.   Dressing or bathing? N  Doing errands, shopping? N  Preparing Food and eating ? N  Using the Toilet? N  In the past six months, have you accidently leaked urine? N  Do you have problems with loss of bowel control? N  Managing your Medications? N  Managing your Finances? N  Housekeeping or managing your Housekeeping? N  Some recent data might be hidden    Patient Care Team: Virginia Crews, MD as PCP - General (Family Medicine)    Assessment:   This is a routine wellness examination for Brandy Wong.  Exercise Activities and Dietary recommendations Current Exercise Habits: Home exercise routine, Type of exercise: yoga;stretching;Other - see comments(plays golf), Time (Minutes): 25, Frequency (Times/Week): 7, Weekly Exercise (Minutes/Week): 175, Intensity: Mild, Exercise limited by: None identified  Goals    . DIET - INCREASE WATER INTAKE     Recommend increasing water intake to 3 glasses a day.        Fall Risk Fall Risk  11/03/2017 10/31/2016  Falls in the past year? No No   Is the patient's home free of loose throw rugs in walkways, pet beds, electrical cords, etc?   yes      Grab bars in the bathroom? yes      Handrails on the stairs?   no      Adequate lighting?   yes  Timed Get Up and Go performed: N/A  Depression Screen PHQ 2/9 Scores 11/03/2017 11/03/2017 10/31/2016  PHQ - 2 Score 0 0 0  PHQ- 9 Score 0 - -      Cognitive Function:      6CIT Screen 11/03/2017  What Year? 0 points  What month? 0 points  What time? 0 points  Count back from 20 0 points  Months in reverse 0 points  Repeat phrase 0 points  Total Score 0    Immunization History  Administered Date(s) Administered  . Influenza, High Dose Seasonal PF  10/31/2016  . Pneumococcal Conjugate-13 11/22/2014  . Pneumococcal Polysaccharide-23 01/19/2017  . Td 01/19/2017    Qualifies for Shingles Vaccine? Due for Shingles vaccine. Declined my offer to administer today. Education has been provided regarding the importance of this vaccine. Pt has been advised to call her insurance company to determine her out of pocket expense. Advised she may also receive this vaccine at her local pharmacy or Health Dept. Verbalized acceptance and understanding.  Screening Tests Health Maintenance  Topic Date Due  . DEXA SCAN  01/11/2002  . INFLUENZA VACCINE  10/08/2017  . TETANUS/TDAP  01/20/2027  . PNA vac Low Risk Adult  Completed    Cancer Screenings: Lung: Low Dose CT Chest recommended if Age 59-80 years, 30 pack-year currently smoking OR have quit w/in 15years. Patient does not qualify. Breast:  Up to date on Mammogram? Yes   Up to date of Bone Density/Dexa? No Colorectal: N/A  Additional Screenings:  Hepatitis C Screening: N/A     Plan:  I have personally reviewed and addressed the Medicare Annual Wellness questionnaire and have noted the following in the patient's chart:  A. Medical and social history B. Use of alcohol, tobacco or illicit drugs  C. Current medications and supplements D. Functional ability and status E.  Nutritional status F.  Physical activity G. Advance directives H. List of other physicians I.  Hospitalizations, surgeries, and ER visits in previous 12 months J.  [place] such as hearing and vision if needed, cognitive and depression L. Referrals and appointments - none  In addition, I have  reviewed and discussed with patient certain preventive protocols, quality metrics, and best practice recommendations. A written personalized care plan for preventive services as well as general preventive health recommendations were provided to patient.  See attached scanned questionnaire for additional information.   Signed,  Fabio Neighbors, LPN Nurse Health Advisor   Nurse Recommendations: DEXA referral sent today.

## 2017-11-03 NOTE — Patient Instructions (Addendum)
Call and schedule a flu shot in a few weeks   Preventive Care 81 Years and Older, Female Preventive care refers to lifestyle choices and visits with your health care provider that can promote health and wellness. What does preventive care include?  A yearly physical exam. This is also called an annual well check.  Dental exams once or twice a year.  Routine eye exams. Ask your health care provider how often you should have your eyes checked.  Personal lifestyle choices, including: ? Daily care of your teeth and gums. ? Regular physical activity. ? Eating a healthy diet. ? Avoiding tobacco and drug use. ? Limiting alcohol use. ? Practicing safe sex. ? Taking low-dose aspirin every day. ? Taking vitamin and mineral supplements as recommended by your health care provider. What happens during an annual well check? The services and screenings done by your health care provider during your annual well check will depend on your age, overall health, lifestyle risk factors, and family history of disease. Counseling Your health care provider may ask you questions about your:  Alcohol use.  Tobacco use.  Drug use.  Emotional well-being.  Home and relationship well-being.  Sexual activity.  Eating habits.  History of falls.  Memory and ability to understand (cognition).  Work and work Statistician.  Reproductive health.  Screening You may have the following tests or measurements:  Height, weight, and BMI.  Blood pressure.  Lipid and cholesterol levels. These may be checked every 5 years, or more frequently if you are over 81 years old.  Skin check.  Lung cancer screening. You may have this screening every year starting at age 18 if you have a 30-pack-year history of smoking and currently smoke or have quit within the past 15 years.  Fecal occult blood test (FOBT) of the stool. You may have this test every year starting at age 43.  Flexible sigmoidoscopy or  colonoscopy. You may have a sigmoidoscopy every 5 years or a colonoscopy every 10 years starting at age 18.  Hepatitis C blood test.  Hepatitis B blood test.  Sexually transmitted disease (STD) testing.  Diabetes screening. This is done by checking your blood sugar (glucose) after you have not eaten for a while (fasting). You may have this done every 1-3 years.  Bone density scan. This is done to screen for osteoporosis. You may have this done starting at age 29.  Mammogram. This may be done every 1-2 years. Talk to your health care provider about how often you should have regular mammograms.  Talk with your health care provider about your test results, treatment options, and if necessary, the need for more tests. Vaccines Your health care provider may recommend certain vaccines, such as:  Influenza vaccine. This is recommended every year.  Tetanus, diphtheria, and acellular pertussis (Tdap, Td) vaccine. You may need a Td booster every 10 years.  Varicella vaccine. You may need this if you have not been vaccinated.  Zoster vaccine. You may need this after age 24.  Measles, mumps, and rubella (MMR) vaccine. You may need at least one dose of MMR if you were born in 1957 or later. You may also need a second dose.  Pneumococcal 13-valent conjugate (PCV13) vaccine. One dose is recommended after age 37.  Pneumococcal polysaccharide (PPSV23) vaccine. One dose is recommended after age 66.  Meningococcal vaccine. You may need this if you have certain conditions.  Hepatitis A vaccine. You may need this if you have certain conditions or if you  travel or work in places where you may be exposed to hepatitis A.  Hepatitis B vaccine. You may need this if you have certain conditions or if you travel or work in places where you may be exposed to hepatitis B.  Haemophilus influenzae type b (Hib) vaccine. You may need this if you have certain conditions.  Talk to your health care provider about  which screenings and vaccines you need and how often you need them. This information is not intended to replace advice given to you by your health care provider. Make sure you discuss any questions you have with your health care provider. Document Released: 03/23/2015 Document Revised: 11/14/2015 Document Reviewed: 12/26/2014 Elsevier Interactive Patient Education  [employer].

## 2017-11-03 NOTE — Patient Instructions (Addendum)
Brandy Wong , Thank you for taking time to come for your Medicare Wellness Visit. I appreciate your ongoing commitment to your health goals. Please review the following plan we discussed and let me know if I can assist you in the future.   Screening recommendations/referrals: Colonoscopy: N/A Mammogram: Up to date Bone Density: Referral sent today.  Recommended yearly ophthalmology/optometry visit for glaucoma screening and checkup Recommended yearly dental visit for hygiene and checkup  Vaccinations: Influenza vaccine: Up to date Pneumococcal vaccine: Up to date Tdap vaccine: Up to date Shingles vaccine: Pt declines today.     Advanced directives: Please bring a copy of your POA (Power of Attorney) and/or Living Will to your next appointment.   Conditions/risks identified: Recommend increasing water intake to 3 glasses a day.   Next appointment: 10:00 AM today with Dr Brita Romp.    Preventive Care 81 Years and Older, Female Preventive care refers to lifestyle choices and visits with your health care provider that can promote health and wellness. What does preventive care include?  A yearly physical exam. This is also called an annual well check.  Dental exams once or twice a year.  Routine eye exams. Ask your health care provider how often you should have your eyes checked.  Personal lifestyle choices, including:  Daily care of your teeth and gums.  Regular physical activity.  Eating a healthy diet.  Avoiding tobacco and drug use.  Limiting alcohol use.  Practicing safe sex.  Taking low-dose aspirin every day.  Taking vitamin and mineral supplements as recommended by your health care provider. What happens during an annual well check? The services and screenings done by your health care provider during your annual well check will depend on your age, overall health, lifestyle risk factors, and family history of disease. Counseling  Your health care provider may  ask you questions about your:  Alcohol use.  Tobacco use.  Drug use.  Emotional well-being.  Home and relationship well-being.  Sexual activity.  Eating habits.  History of falls.  Memory and ability to understand (cognition).  Work and work Statistician.  Reproductive health. Screening  You may have the following tests or measurements:  Height, weight, and BMI.  Blood pressure.  Lipid and cholesterol levels. These may be checked every 5 years, or more frequently if you are over 6 years old.  Skin check.  Lung cancer screening. You may have this screening every year starting at age 7 if you have a 30-pack-year history of smoking and currently smoke or have quit within the past 15 years.  Fecal occult blood test (FOBT) of the stool. You may have this test every year starting at age 86.  Flexible sigmoidoscopy or colonoscopy. You may have a sigmoidoscopy every 5 years or a colonoscopy every 10 years starting at age 66.  Hepatitis C blood test.  Hepatitis B blood test.  Sexually transmitted disease (STD) testing.  Diabetes screening. This is done by checking your blood sugar (glucose) after you have not eaten for a while (fasting). You may have this done every 1-3 years.  Bone density scan. This is done to screen for osteoporosis. You may have this done starting at age 14.  Mammogram. This may be done every 1-2 years. Talk to your health care provider about how often you should have regular mammograms. Talk with your health care provider about your test results, treatment options, and if necessary, the need for more tests. Vaccines  Your health care provider may recommend certain  vaccines, such as:  Influenza vaccine. This is recommended every year.  Tetanus, diphtheria, and acellular pertussis (Tdap, Td) vaccine. You may need a Td booster every 10 years.  Zoster vaccine. You may need this after age 21.  Pneumococcal 13-valent conjugate (PCV13) vaccine. One  dose is recommended after age 71.  Pneumococcal polysaccharide (PPSV23) vaccine. One dose is recommended after age 43. Talk to your health care provider about which screenings and vaccines you need and how often you need them. This information is not intended to replace advice given to you by your health care provider. Make sure you discuss any questions you have with your health care provider. Document Released: 03/23/2015 Document Revised: 11/14/2015 Document Reviewed: 12/26/2014 Elsevier Interactive Patient Education  2017 [place] Prevention in the Home Falls can cause injuries. They can happen to people of all ages. There are many things you can do to make your home safe and to help prevent falls. What can I do on the outside of my home?  Regularly fix the edges of walkways and driveways and fix any cracks.  Remove anything that might make you trip as you walk through a door, such as a raised step or threshold.  Trim any bushes or trees on the path to your home.  Use bright outdoor lighting.  Clear any walking paths of anything that might make someone trip, such as rocks or tools.  Regularly check to see if handrails are loose or broken. Make sure that both sides of any steps have handrails.  Any raised decks and porches should have guardrails on the edges.  Have any leaves, snow, or ice cleared regularly.  Use sand or salt on walking paths during winter.  Clean up any spills in your garage right away. This includes oil or grease spills. What can I do in the bathroom?  Use night lights.  Install grab bars by the toilet and in the tub and shower. Do not use towel bars as grab bars.  Use non-skid mats or decals in the tub or shower.  If you need to sit down in the shower, use a plastic, non-slip stool.  Keep the floor dry. Clean up any water that spills on the floor as soon as it happens.  Remove soap buildup in the tub or shower regularly.  Attach bath  mats securely with double-sided non-slip rug tape.  Do not have throw rugs and other things on the floor that can make you trip. What can I do in the bedroom?  Use night lights.  Make sure that you have a light by your bed that is easy to reach.  Do not use any sheets or blankets that are too big for your bed. They should not hang down onto the floor.  Have a firm chair that has side arms. You can use this for support while you get dressed.  Do not have throw rugs and other things on the floor that can make you trip. What can I do in the kitchen?  Clean up any spills right away.  Avoid walking on wet floors.  Keep items that you use a lot in easy-to-reach places.  If you need to reach something above you, use a strong step stool that has a grab bar.  Keep electrical cords out of the way.  Do not use floor polish or wax that makes floors slippery. If you must use wax, use non-skid floor wax.  Do not have throw rugs and other things  on the floor that can make you trip. What can I do with my stairs?  Do not leave any items on the stairs.  Make sure that there are handrails on both sides of the stairs and use them. Fix handrails that are broken or loose. Make sure that handrails are as long as the stairways.  Check any carpeting to make sure that it is firmly attached to the stairs. Fix any carpet that is loose or worn.  Avoid having throw rugs at the top or bottom of the stairs. If you do have throw rugs, attach them to the floor with carpet tape.  Make sure that you have a light switch at the top of the stairs and the bottom of the stairs. If you do not have them, ask someone to add them for you. What else can I do to help prevent falls?  Wear shoes that:  Do not have high heels.  Have rubber bottoms.  Are comfortable and fit you well.  Are closed at the toe. Do not wear sandals.  If you use a stepladder:  Make sure that it is fully opened. Do not climb a closed  stepladder.  Make sure that both sides of the stepladder are locked into place.  Ask someone to hold it for you, if possible.  Clearly mark and make sure that you can see:  Any grab bars or handrails.  First and last steps.  Where the edge of each step is.  Use tools that help you move around (mobility aids) if they are needed. These include:  Canes.  Walkers.  Scooters.  Crutches.  Turn on the lights when you go into a dark area. Replace any light bulbs as soon as they burn out.  Set up your furniture so you have a clear path. Avoid moving your furniture around.  If any of your floors are uneven, fix them.  If there are any pets around you, be aware of where they are.  Review your medicines with your doctor. Some medicines can make you feel dizzy. This can increase your chance of falling. Ask your doctor what other things that you can do to help prevent falls. This information is not intended to replace advice given to you by your health care provider. Make sure you discuss any questions you have with your health care provider. Document Released: 12/21/2008 Document Revised: 08/02/2015 Document Reviewed: 03/31/2014 Elsevier Interactive Patient Education  2017 [employer].

## 2017-11-03 NOTE — Progress Notes (Signed)
Patient: Brandy Wong, Female    DOB: 1934/05/11, 82 y.o.   MRN: [id number] Visit Date: 11/03/2017  Today's Provider: Lavon Paganini, MD   Chief Complaint  Patient presents with  . Annual Exam   Subjective:  I, Tiburcio Pea, CMA, am acting as a scribe for Lavon Paganini, MD.   Patient had a AWE with [facility] today prior to appointment .   Complete Physical Brandy Wong is a 83 y.o. female. She feels well. She reports exercising doing yoga and playing golf. She reports she is sleeping poorly.  -----------------------------------------------------------   Review of Systems  Constitutional: Negative.   HENT: Negative.   Eyes: Negative.   Respiratory: Negative.   Cardiovascular: Negative.   Gastrointestinal: Negative.   Endocrine: Negative.   Genitourinary: Negative.   Musculoskeletal: Negative.        Bilateral knee pain   Skin: Negative.   Allergic/Immunologic: Negative.   Neurological: Negative.   Hematological: Negative.   Psychiatric/Behavioral: Negative.     Social History   Socioeconomic History  . Marital status: Widowed    Spouse name: Not on file  . Number of children: 2  . Years of education: some college  . Highest education level: Some college, no degree  Occupational History    Employer: RETIRED  Social Needs  . Financial resource strain: Not hard at all  . Food insecurity:    Worry: Never true    Inability: Never true  . Transportation needs:    Medical: No    Non-medical: No  Tobacco Use  . Smoking status: Former Smoker    Packs/day: 0.15    Years: 10.00    Pack years: 1.50    Last attempt to quit: 03/10/1987    Years since quitting: 30.6  . Smokeless tobacco: Never Used  Substance and Sexual Activity  . Alcohol use: Yes    Alcohol/week: 7.0 standard drinks    Types: 7 Glasses of wine per week  . Drug use: No  . Sexual activity: Never  Lifestyle  . Physical activity:    Days per week: Not on file    Minutes per  session: Not on file  . Stress: Not at all  Relationships  . Social connections:    Talks on phone: Not on file    Gets together: Not on file    Attends religious service: Not on file    Active member of club or organization: Not on file    Attends meetings of clubs or organizations: Not on file    Relationship status: Not on file  . Intimate partner violence:    Fear of current or ex partner: Not on file    Emotionally abused: Not on file    Physically abused: Not on file    Forced sexual activity: Not on file  Other Topics Concern  . Not on file  Social History Narrative  . Not on file    Past Medical History:  Diagnosis Date  . Cancer [facility]) 2013   R breast, s/p radiation and lumpectomy  . GERD (gastroesophageal reflux disease)   . Hypertension      Patient Active Problem List   Diagnosis Date Noted  . GERD (gastroesophageal reflux disease) 10/31/2016  . HTN (hypertension) 10/31/2016  . Avitaminosis D 10/31/2016  . History of uterine prolapse 05/19/2013  . Malignant neoplasm of lower-outer quadrant of right female breast ([facility]) 11/05/2012    Past Surgical History:  Procedure Laterality Date  . APPENDECTOMY    .  BREAST SURGERY  2013   lumpectomy  . LAPAROSCOPIC VAGINAL HYSTERECTOMY WITH SALPINGO OOPHORECTOMY    . TUBAL LIGATION      Her family history includes Dementia (age of onset: 53) in her mother; GER disease in her mother; Heart attack in her father; Stroke in her father.      Current Outpatient Medications:  .  Cholecalciferol (VITAMIN D-1000 MAX ST) 1000 units tablet, Take 1 tablet by mouth daily., Disp: , Rfl:  .  hydrochlorothiazide (HYDRODIURIL) 25 MG tablet, Take 1 tablet (25 mg total) by mouth daily., Disp: 90 tablet, Rfl: 2 .  MAGNESIUM PO, Take 1 capsule by mouth daily., Disp: , Rfl:  .  omeprazole (PRILOSEC) 20 MG capsule, Take 1 capsule (20 mg total) by mouth daily. (Patient taking differently: Take 20 mg by mouth every other day. ), Disp: 90  capsule, Rfl: 2 .  Polyethyl Glycol-Propyl Glycol (SYSTANE OP), Apply 1 drop to eye 4 (four) times daily as needed., Disp: , Rfl:  .  TURMERIC PO, Take 1 tablet by mouth daily., Disp: , Rfl:  .  doxycycline (VIBRA-TABS) 100 MG tablet, Take 1 tablet (100 mg total) 2 (two) times daily by mouth. (Patient not taking: Reported on 11/03/2017), Disp: 14 tablet, Rfl: 0  Patient Care Team: Virginia Crews, MD as PCP - General (Family Medicine)     Objective:   Vitals: BP 132/76 (BP Location: Right Arm, Patient Position: Sitting, Cuff Size: Normal)   Pulse 91   Temp 98.6 F (37 C) (Oral)   Ht 5\' 1"  (1.549 m)   Wt 118 lb 3.2 oz (53.6 kg)   BMI 22.33 kg/m   Physical Exam  Constitutional: She is oriented to person, place, and time. She appears well-developed and well-nourished. No distress.  HENT:  Head: Normocephalic and atraumatic.  Right Ear: External ear normal.  Left Ear: External ear normal.  Nose: Nose normal.  Mouth/Throat: Oropharynx is clear and moist.  Eyes: Pupils are equal, round, and reactive to light. Conjunctivae and EOM are normal. No scleral icterus.  Neck: Neck supple. No thyromegaly present.  Cardiovascular: Normal rate, regular rhythm, normal heart sounds and intact distal pulses.  No murmur heard. Pulmonary/Chest: Effort normal and breath sounds normal. No respiratory distress. She has no wheezes. She has no rales.  Abdominal: Soft. Bowel sounds are normal. She exhibits no distension. There is no tenderness. There is no rebound and no guarding.  Musculoskeletal: She exhibits no edema or deformity.  Lymphadenopathy:    She has no cervical adenopathy.  Neurological: She is alert and oriented to person, place, and time.  Skin: Skin is warm and dry. Capillary refill takes less than 2 seconds. No rash noted.  Psychiatric: She has a normal mood and affect. Her behavior is normal.  Vitals reviewed.   Activities of Daily Living In your present state of health, do you  have any difficulty performing the following activities: 11/03/2017  Hearing? N  Vision? Y  Comment Due to dry eyes.   Difficulty concentrating or making decisions? N  Walking or climbing stairs? Y  Comment Due to knee pains and weakness in legs.   Dressing or bathing? N  Doing errands, shopping? N  Preparing Food and eating ? N  Using the Toilet? N  In the past six months, have you accidently leaked urine? N  Do you have problems with loss of bowel control? N  Managing your Medications? N  Managing your Finances? N  Housekeeping or managing your Housekeeping?  N  Some recent data might be hidden    Fall Risk Assessment Fall Risk  11/03/2017 10/31/2016  Falls in the past year? No No     Depression Screen PHQ 2/9 Scores 11/03/2017 11/03/2017 10/31/2016  PHQ - 2 Score 0 0 0  PHQ- 9 Score 0 - -    Assessment & Plan:    Annual Physical Reviewed patient's Family Medical History Reviewed and updated list of patient's medical providers Assessment of cognitive impairment was done Assessed patient's functional ability Established a written schedule for health screening [place] Completed and Reviewed  Exercise Activities and Dietary recommendations Goals    . DIET - INCREASE WATER INTAKE     Recommend increasing water intake to 3 glasses a day.        Immunization History  Administered Date(s) Administered  . Influenza, High Dose Seasonal PF 10/31/2016  . Pneumococcal Conjugate-13 11/22/2014  . Pneumococcal Polysaccharide-23 01/19/2017  . Td 01/19/2017    Health Maintenance  Topic Date Due  . DEXA SCAN  01/11/2002  . INFLUENZA VACCINE  10/08/2017  . TETANUS/TDAP  01/20/2027  . PNA vac Low Risk Adult  Completed     Discussed health benefits of physical activity, and encouraged her to engage in regular exercise appropriate for her age and condition.      ------------------------------------------------------------------------------------------------------------  Problem List Items Addressed This Visit      Cardiovascular and Mediastinum   HTN (hypertension)   Relevant Medications   hydrochlorothiazide (HYDRODIURIL) 25 MG tablet   Other Relevant Orders   Comprehensive metabolic panel   CBC     Digestive   GERD (gastroesophageal reflux disease)   Relevant Medications   omeprazole (PRILOSEC) 20 MG capsule     Other   Avitaminosis D   Relevant Orders   CBC   VITAMIN D 25 Hydroxy (Vit-D Deficiency, Fractures)    Other Visit Diagnoses    Encounter for annual physical exam    -  Primary   Pure hypercholesterolemia       Relevant Medications   hydrochlorothiazide (HYDRODIURIL) 25 MG tablet   Other Relevant Orders   Comprehensive metabolic panel   Lipid panel       Return in about 1 year (around 11/04/2018) for CPE (as scheduled).   The entirety of the information documented in the History of Present Illness, Review of Systems and Physical Exam were personally obtained by me. Portions of this information were initially documented by Tiburcio Pea, CMA and reviewed by me for thoroughness and accuracy.    Virginia Crews, MD, MPH [facility] 11/03/2017 10:46 AM

## 2017-11-04 ENCOUNTER — Telehealth: Payer: Self-pay

## 2017-11-04 LAB — COMPREHENSIVE METABOLIC PANEL
A/G RATIO: 1.8 (ref 1.2–2.2)
ALT: 16 IU/L (ref 0–32)
AST: 26 IU/L (ref 0–40)
Albumin: 4.8 g/dL — ABNORMAL HIGH (ref 3.5–4.7)
Alkaline Phosphatase: 88 IU/L (ref 39–117)
BILIRUBIN TOTAL: 0.3 mg/dL (ref 0.0–1.2)
BUN/Creatinine Ratio: 15 (ref 12–28)
BUN: 10 mg/dL (ref 8–27)
CHLORIDE: 96 mmol/L (ref 96–106)
CO2: 25 mmol/L (ref 20–29)
Calcium: 10.1 mg/dL (ref 8.7–10.3)
Creatinine, Ser: 0.68 mg/dL (ref 0.57–1.00)
GFR, EST AFRICAN AMERICAN: 96 mL/min/{1.73_m2} (ref >59)
GFR, EST NON AFRICAN AMERICAN: 83 mL/min/{1.73_m2} (ref >59)
GLOBULIN, TOTAL: 2.6 g/dL (ref 1.5–4.5)
Glucose: 94 mg/dL (ref 65–99)
POTASSIUM: 4.7 mmol/L (ref 3.5–5.2)
SODIUM: 137 mmol/L (ref 134–144)
Total Protein: 7.4 g/dL (ref 6.0–8.5)

## 2017-11-04 LAB — CBC
HEMATOCRIT: 40.6 % (ref 34.0–46.6)
HEMOGLOBIN: 14.1 g/dL (ref 11.1–15.9)
MCH: 31.9 pg (ref 26.6–33.0)
MCHC: 34.7 g/dL (ref 31.5–35.7)
MCV: 92 fL (ref 79–97)
Platelets: 414 10*3/uL (ref 150–450)
RBC: 4.42 x10E6/uL (ref 3.77–5.28)
RDW: 12.7 % (ref 12.3–15.4)
WBC: 6.9 10*3/uL (ref 3.4–10.8)

## 2017-11-04 LAB — LIPID PANEL
CHOL/HDL RATIO: 2.6 ratio (ref 0.0–4.4)
Cholesterol, Total: 253 mg/dL — ABNORMAL HIGH (ref 100–199)
HDL: 98 mg/dL (ref >39)
LDL Calculated: 135 mg/dL — ABNORMAL HIGH (ref 0–99)
Triglycerides: 99 mg/dL (ref 0–149)
VLDL Cholesterol Cal: 20 mg/dL (ref 5–40)

## 2017-11-04 LAB — VITAMIN D 25 HYDROXY (VIT D DEFICIENCY, FRACTURES): VIT D 25 HYDROXY: 48.5 ng/mL (ref 30.0–100.0)

## 2017-11-04 NOTE — Telephone Encounter (Signed)
Attempted to contact patient. No answer nor voicemail.  

## 2017-11-04 NOTE — Telephone Encounter (Signed)
-----   Message from Virginia Crews, MD sent at 11/04/2017 10:34 AM EDT ----- Normal blood sugar, kidney function, liver function, electrolytes, blood counts, vitamin D.  Cholesterol has improved since last year.  Virginia Crews, MD, MPH [facility] 11/04/2017 10:33 AM

## 2017-11-10 NOTE — Telephone Encounter (Signed)
Patient advised.

## 2017-11-11 ENCOUNTER — Ambulatory Visit (INDEPENDENT_AMBULATORY_CARE_PROVIDER_SITE_OTHER): Payer: Medicare HMO

## 2017-11-11 DIAGNOSIS — Z23 Encounter for immunization: Secondary | ICD-10-CM

## 2017-11-17 ENCOUNTER — Telehealth: Payer: Self-pay | Admitting: Family Medicine

## 2017-11-17 NOTE — Telephone Encounter (Signed)
Pt did not want our office to schedule bone density.She prefers to schedule appointment herself

## 2017-11-18 ENCOUNTER — Ambulatory Visit: Payer: Medicare HMO

## 2017-12-03 ENCOUNTER — Ambulatory Visit
Admission: RE | Admit: 2017-12-03 | Discharge: 2017-12-03 | Disposition: A | Discharge status: Home / Self Care | Payer: Medicare HMO | Source: Ambulatory Visit | Attending: Family Medicine | Admitting: Family Medicine

## 2017-12-03 DIAGNOSIS — M810 Age-related osteoporosis without current pathological fracture: Secondary | ICD-10-CM | POA: Diagnosis not present

## 2017-12-03 DIAGNOSIS — Z780 Asymptomatic menopausal state: Secondary | ICD-10-CM | POA: Diagnosis not present

## 2017-12-03 DIAGNOSIS — Z853 Personal history of malignant neoplasm of breast: Secondary | ICD-10-CM | POA: Insufficient documentation

## 2017-12-03 DIAGNOSIS — Z13820 Encounter for screening for osteoporosis: Secondary | ICD-10-CM | POA: Insufficient documentation

## 2017-12-04 ENCOUNTER — Telehealth: Payer: Self-pay

## 2017-12-04 NOTE — Telephone Encounter (Signed)
-----   Message from Virginia Crews, MD sent at 12/04/2017  2:10 PM EDT ----- Bone density scan shows osteoporosis of the left femoral neck.  We do not have any previous bone density scans to compare this to.  Wonder if the patient has ever had treatment for osteoporosis in the past.  She should at least continue calcium and vitamin D supplements.  We could discuss treatment for osteoporosis risks and benefits.  These can decrease 10-year risk of fracture, so sometimes at age 83, we may consider no further treatment.  I be happy to see the patient to discuss this further  Virginia Crews, MD, MPH [facility] 12/04/2017 2:10 PM

## 2017-12-04 NOTE — Telephone Encounter (Signed)
Patient advised. She states her last BMD showed osteopenia. She states she has been taking Vitamin D 2000 IU, but not taking any Calcium. Patient prefers to take supplements instead of prescription RX at this time.

## 2017-12-07 DIAGNOSIS — D2271 Melanocytic nevi of right lower limb, including hip: Secondary | ICD-10-CM | POA: Diagnosis not present

## 2017-12-07 DIAGNOSIS — D2272 Melanocytic nevi of left lower limb, including hip: Secondary | ICD-10-CM | POA: Diagnosis not present

## 2017-12-07 DIAGNOSIS — Z08 Encounter for follow-up examination after completed treatment for malignant neoplasm: Secondary | ICD-10-CM | POA: Diagnosis not present

## 2017-12-07 DIAGNOSIS — L570 Actinic keratosis: Secondary | ICD-10-CM | POA: Diagnosis not present

## 2017-12-07 DIAGNOSIS — D485 Neoplasm of uncertain behavior of skin: Secondary | ICD-10-CM | POA: Diagnosis not present

## 2017-12-07 DIAGNOSIS — D2262 Melanocytic nevi of left upper limb, including shoulder: Secondary | ICD-10-CM | POA: Diagnosis not present

## 2017-12-07 DIAGNOSIS — L821 Other seborrheic keratosis: Secondary | ICD-10-CM | POA: Diagnosis not present

## 2017-12-07 DIAGNOSIS — Z85828 Personal history of other malignant neoplasm of skin: Secondary | ICD-10-CM | POA: Diagnosis not present

## 2017-12-07 DIAGNOSIS — L300 Nummular dermatitis: Secondary | ICD-10-CM | POA: Diagnosis not present

## 2017-12-07 DIAGNOSIS — D225 Melanocytic nevi of trunk: Secondary | ICD-10-CM | POA: Diagnosis not present

## 2017-12-07 DIAGNOSIS — D2261 Melanocytic nevi of right upper limb, including shoulder: Secondary | ICD-10-CM | POA: Diagnosis not present

## 2017-12-14 ENCOUNTER — Encounter: Payer: Self-pay | Admitting: Family Medicine

## 2017-12-14 DIAGNOSIS — Z853 Personal history of malignant neoplasm of breast: Secondary | ICD-10-CM | POA: Diagnosis not present

## 2017-12-14 DIAGNOSIS — Z1231 Encounter for screening mammogram for malignant neoplasm of breast: Secondary | ICD-10-CM | POA: Diagnosis not present

## 2017-12-14 LAB — HM MAMMOGRAPHY

## 2017-12-16 ENCOUNTER — Encounter: Payer: Self-pay | Admitting: Family Medicine

## 2017-12-17 DIAGNOSIS — L570 Actinic keratosis: Secondary | ICD-10-CM | POA: Diagnosis not present

## 2018-03-15 DIAGNOSIS — M5134 Other intervertebral disc degeneration, thoracic region: Secondary | ICD-10-CM | POA: Diagnosis not present

## 2018-03-15 DIAGNOSIS — M9902 Segmental and somatic dysfunction of thoracic region: Secondary | ICD-10-CM | POA: Diagnosis not present

## 2018-03-15 DIAGNOSIS — M5033 Other cervical disc degeneration, cervicothoracic region: Secondary | ICD-10-CM | POA: Diagnosis not present

## 2018-03-15 DIAGNOSIS — M9901 Segmental and somatic dysfunction of cervical region: Secondary | ICD-10-CM | POA: Diagnosis not present

## 2018-04-28 DIAGNOSIS — H2513 Age-related nuclear cataract, bilateral: Secondary | ICD-10-CM | POA: Diagnosis not present

## 2018-05-17 DIAGNOSIS — R69 Illness, unspecified: Secondary | ICD-10-CM | POA: Diagnosis not present

## 2018-07-13 DIAGNOSIS — L821 Other seborrheic keratosis: Secondary | ICD-10-CM | POA: Diagnosis not present

## 2018-07-13 DIAGNOSIS — Z85828 Personal history of other malignant neoplasm of skin: Secondary | ICD-10-CM | POA: Diagnosis not present

## 2018-07-13 DIAGNOSIS — L820 Inflamed seborrheic keratosis: Secondary | ICD-10-CM | POA: Diagnosis not present

## 2018-07-13 DIAGNOSIS — Z08 Encounter for follow-up examination after completed treatment for malignant neoplasm: Secondary | ICD-10-CM | POA: Diagnosis not present

## 2018-08-03 DIAGNOSIS — H04123 Dry eye syndrome of bilateral lacrimal glands: Secondary | ICD-10-CM | POA: Diagnosis not present

## 2018-08-03 DIAGNOSIS — H35372 Puckering of macula, left eye: Secondary | ICD-10-CM | POA: Diagnosis not present

## 2018-08-03 DIAGNOSIS — H25811 Combined forms of age-related cataract, right eye: Secondary | ICD-10-CM | POA: Diagnosis not present

## 2018-08-03 DIAGNOSIS — Z01818 Encounter for other preprocedural examination: Secondary | ICD-10-CM | POA: Diagnosis not present

## 2018-08-03 DIAGNOSIS — H25812 Combined forms of age-related cataract, left eye: Secondary | ICD-10-CM | POA: Diagnosis not present

## 2018-08-26 DIAGNOSIS — H25812 Combined forms of age-related cataract, left eye: Secondary | ICD-10-CM | POA: Diagnosis not present

## 2018-08-26 DIAGNOSIS — H2512 Age-related nuclear cataract, left eye: Secondary | ICD-10-CM | POA: Diagnosis not present

## 2018-10-01 DIAGNOSIS — Z0100 Encounter for examination of eyes and vision without abnormal findings: Secondary | ICD-10-CM | POA: Diagnosis not present

## 2018-10-01 DIAGNOSIS — H524 Presbyopia: Secondary | ICD-10-CM | POA: Diagnosis not present

## 2018-10-18 ENCOUNTER — Other Ambulatory Visit: Payer: Self-pay

## 2018-10-18 DIAGNOSIS — K219 Gastro-esophageal reflux disease without esophagitis: Secondary | ICD-10-CM

## 2018-10-18 DIAGNOSIS — I10 Essential (primary) hypertension: Secondary | ICD-10-CM

## 2018-10-18 MED ORDER — OMEPRAZOLE 20 MG PO CPDR: 20.0000 mg | DELAYED_RELEASE_CAPSULE | Freq: Every day | ORAL | 3 refills | Status: DC

## 2018-10-18 MED ORDER — HYDROCHLOROTHIAZIDE 25 MG PO TABS: 25.0000 mg | ORAL_TABLET | Freq: Every day | ORAL | 3 refills | Status: DC

## 2018-11-04 NOTE — Progress Notes (Signed)
Subjective:   Brandy Wong is a 84 y.o. female who presents for Medicare Annual (Subsequent) preventive examination.    This visit is being conducted through telemedicine due to the COVID-19 pandemic. This patient has given me verbal consent via doximity to conduct this visit, patient states they are participating from their home address. Some vital signs may be absent or patient reported.    Patient identification: identified by name, DOB, and current address  Review of Systems:  N/A  Cardiac Risk Factors include: advanced age (>96men, >29 women);hypertension     Objective:     Vitals: There were no vitals taken for this visit.  There is no height or weight on file to calculate BMI. Unable to obtain vitals due to visit being conducted via telephonically.   Advanced Directives 11/08/2018 11/03/2017 10/31/2016  Does Patient Have a Medical Advance Directive? Yes Yes Yes  Type of Paramedic of [place];Living will [place];Living will [place];Living will  Copy of [place] in Chart? Yes - validated most recent copy scanned in chart (See row information) No - copy requested -    Tobacco Social History   Tobacco Use  Smoking Status Former Smoker   Packs/day: 0.15   Years: 10.00   Pack years: 1.50   Quit date: 03/10/1987   Years since quitting: 31.6  Smokeless Tobacco Never Used     Counseling given: Not Answered   Clinical Intake:  Pre-visit preparation completed: Yes  Pain : No/denies pain Pain Score: 0-No pain     Nutritional Risks: None Diabetes: No  How often do you need to have someone help you when you read instructions, pamphlets, or other written materials from your doctor or pharmacy?: 1 - Never  Interpreter Needed?: No  Information entered by :: [facility], LPN  Past Medical History:  Diagnosis Date   Cancer ([place]) 2013   R breast, s/p radiation and lumpectomy     GERD (gastroesophageal reflux disease)    Hypertension    Past Surgical History:  Procedure Laterality Date   APPENDECTOMY     BREAST SURGERY  2013   lumpectomy   CATARACT EXTRACTION     LAPAROSCOPIC VAGINAL HYSTERECTOMY WITH SALPINGO OOPHORECTOMY     TUBAL LIGATION     Family History  Problem Relation Age of Onset   GER disease Mother    Dementia Mother 86   Stroke Father    Heart attack Father    Social History   Socioeconomic History   Marital status: Widowed    Spouse name: Not on file   Number of children: 2   Years of education: some college   Highest education level: Some college, no degree  Occupational History    Employer: RETIRED  Scientist, product/process development strain: Not hard at all   Food insecurity    Worry: Never true    Inability: Never true   Transportation needs    Medical: No    Non-medical: No  Tobacco Use   Smoking status: Former Smoker    Packs/day: 0.15    Years: 10.00    Pack years: 1.50    Quit date: 03/10/1987    Years since quitting: 31.6   Smokeless tobacco: Never Used  Substance and Sexual Activity   Alcohol use: Yes    Alcohol/week: 7.0 standard drinks    Types: 7 Glasses of wine per week   Drug use: No   Sexual activity:  Never  Lifestyle   Physical activity    Days per week: 0 days    Minutes per session: 0 min   Stress: Not at all  Relationships   Social connections    Talks on phone: Patient refused    Gets together: Patient refused    Attends religious service: Patient refused    Active member of club or organization: Patient refused    Attends meetings of clubs or organizations: Patient refused    Relationship status: Patient refused  Other Topics Concern   Not on file  Social History Narrative   Not on file    Outpatient Encounter Medications as of 11/08/2018  Medication Sig   calcium carbonate (OSCAL) 1500 (600 Ca) MG TABS tablet Take 600 mg of elemental calcium by mouth 2  (two) times daily with a meal.   Cholecalciferol (VITAMIN D-1000 MAX ST) 1000 units tablet Take 1 tablet by mouth daily.   Cranberry 450 MG CAPS Take 450 mg by mouth daily.   diphenhydramine-acetaminophen (TYLENOL PM) 25-500 MG TABS tablet Take 1 tablet by mouth at bedtime as needed (every other night).   hydrochlorothiazide (HYDRODIURIL) 25 MG tablet Take 1 tablet (25 mg total) by mouth daily.   MAGNESIUM PO Take 1 capsule by mouth daily.   omeprazole (PRILOSEC) 20 MG capsule Take 1 capsule (20 mg total) by mouth daily.   Polyethyl Glycol-Propyl Glycol (SYSTANE OP) Apply 1 drop to eye 4 (four) times daily as needed.   TURMERIC PO Take 1 tablet by mouth daily.   vitamin C (ASCORBIC ACID) 500 MG tablet Take 500 mg by mouth daily.   No facility-administered encounter medications on file as of 11/08/2018.     Activities of Daily Living In your present state of health, do you have any difficulty performing the following activities: 11/08/2018  Hearing? N  Vision? Y  Comment Due to an issue post cataract surgery. Is currently being seen by an eye doctor for this.  Difficulty concentrating or making decisions? N  Walking or climbing stairs? Y  Comment Due to leg weakness.  Dressing or bathing? N  Doing errands, shopping? N  Preparing Food and eating ? N  Using the Toilet? N  In the past six months, have you accidently leaked urine? N  Do you have problems with loss of bowel control? N  Managing your Medications? N  Managing your Finances? N  Housekeeping or managing your Housekeeping? N  Some recent data might be hidden    Patient Care Team: Virginia Crews, MD as PCP - General (Family Medicine) Alanda Slim Neena Rhymes, MD as Consulting Physician (Ophthalmology)    Assessment:   This is a routine wellness examination for Brandy Wong.  Exercise Activities and Dietary recommendations Current Exercise Habits: Home exercise routine, Type of exercise: walking;yoga, Time (Minutes):  20, Frequency (Times/Week): 5, Weekly Exercise (Minutes/Week): 100, Intensity: Mild, Exercise limited by: None identified  Goals     DIET - INCREASE WATER INTAKE     Recommend increasing water intake to 3 glasses a day.        Fall Risk: Fall Risk  11/08/2018 11/03/2017 10/31/2016  Falls in the past year? 0 No No    FALL RISK PREVENTION PERTAINING TO THE HOME:  Any stairs in or around the home? No  If so, are there any without handrails? N/A  Home free of loose throw rugs in walkways, pet beds, electrical cords, etc? Yes  Adequate lighting in your home to reduce risk of falls?  Yes   ASSISTIVE DEVICES UTILIZED TO PREVENT FALLS:  Life alert? No  Use of a cane, walker or w/c? No  Grab bars in the bathroom? Yes  Shower chair or bench in shower? Yes  Elevated toilet seat or a handicapped toilet? Yes    TIMED UP AND GO:  Was the test performed? No .    Depression Screen PHQ 2/9 Scores 11/08/2018 11/03/2017 11/03/2017 10/31/2016  PHQ - 2 Score 0 0 0 0  PHQ- 9 Score - 0 - -     Cognitive Function: Declined today.      6CIT Screen 11/03/2017  What Year? 0 points  What month? 0 points  What time? 0 points  Count back from 20 0 points  Months in reverse 0 points  Repeat phrase 0 points  Total Score 0    Immunization History  Administered Date(s) Administered   Influenza, High Dose Seasonal PF 10/31/2016, 11/11/2017   Pneumococcal Conjugate-13 11/22/2014   Pneumococcal Polysaccharide-23 01/19/2017   Td 01/19/2017    Qualifies for Shingles Vaccine? Yes . Due for Shingrix. Education has been provided regarding the importance of this vaccine. Pt has been advised to call insurance company to determine out of pocket expense. Advised may also receive vaccine at local pharmacy or Health Dept. Verbalized acceptance and understanding.  Tdap: Up to date  Flu Vaccine: Due for Flu vaccine. Does the patient want to receive this vaccine today?  No .   Pneumococcal Vaccine:  Completed series  Screening Tests Health Maintenance  Topic Date Due   INFLUENZA VACCINE  10/09/2018   DEXA SCAN  12/04/2019   TETANUS/TDAP  01/20/2027   PNA vac Low Risk Adult  Completed    Cancer Screenings:  Colorectal Screening: No longer required.   Mammogram: No longer required.   Bone Density: Completed 12/04/27. Results reflect OSTEOPOROSIS. Repeat every 2 years.   Lung Cancer Screening: (Low Dose CT Chest recommended if Age 19-80 years, 30 pack-year currently smoking OR have quit w/in 15years.) does not qualify.   Additional Screening:  Vision Screening: Recommended annual ophthalmology exams for early detection of glaucoma and other disorders of the eye.  Dental Screening: Recommended annual dental exams for proper oral hygiene  Community Resource Referral:  CRR required this visit?  No       Plan:  I have personally reviewed and addressed the Medicare Annual Wellness questionnaire and have noted the following in the patients chart:  A. Medical and social history B. Use of alcohol, tobacco or illicit drugs  C. Current medications and supplements D. Functional ability and status E.  Nutritional status F.  Physical activity G. Advance directives H. List of other physicians I.  Hospitalizations, surgeries, and ER visits in previous 12 months J.  [place] such as hearing and vision if needed, cognitive and depression L. Referrals and appointments   In addition, I have reviewed and discussed with patient certain preventive protocols, quality metrics, and best practice recommendations. A written personalized care plan for preventive services as well as general preventive health recommendations were provided to patient. Nurse Health Advisor  Signed,    Quaneshia Wareing [place], [place]  [id number] Nurse Health Advisor   Nurse Notes: Pt to receive the influenza vaccine at today's office visit.

## 2018-11-08 ENCOUNTER — Ambulatory Visit (INDEPENDENT_AMBULATORY_CARE_PROVIDER_SITE_OTHER): Payer: Medicare HMO

## 2018-11-08 ENCOUNTER — Encounter: Payer: Self-pay | Admitting: Family Medicine

## 2018-11-08 ENCOUNTER — Other Ambulatory Visit: Payer: Self-pay

## 2018-11-08 ENCOUNTER — Ambulatory Visit (INDEPENDENT_AMBULATORY_CARE_PROVIDER_SITE_OTHER): Payer: Medicare HMO | Admitting: Family Medicine

## 2018-11-08 VITALS — BP 118/75 | HR 84 | Temp 97.3°F | Ht 61.0 in | Wt 114.0 lb

## 2018-11-08 DIAGNOSIS — I10 Essential (primary) hypertension: Secondary | ICD-10-CM | POA: Diagnosis not present

## 2018-11-08 DIAGNOSIS — K219 Gastro-esophageal reflux disease without esophagitis: Secondary | ICD-10-CM | POA: Diagnosis not present

## 2018-11-08 DIAGNOSIS — E559 Vitamin D deficiency, unspecified: Secondary | ICD-10-CM | POA: Diagnosis not present

## 2018-11-08 DIAGNOSIS — R2689 Other abnormalities of gait and mobility: Secondary | ICD-10-CM

## 2018-11-08 DIAGNOSIS — Z0000 Encounter for general adult medical examination without abnormal findings: Secondary | ICD-10-CM | POA: Diagnosis not present

## 2018-11-08 DIAGNOSIS — Z23 Encounter for immunization: Secondary | ICD-10-CM

## 2018-11-08 DIAGNOSIS — G4700 Insomnia, unspecified: Secondary | ICD-10-CM | POA: Insufficient documentation

## 2018-11-08 MED ORDER — TRAZODONE HCL 50 MG PO TABS: 25.0000 mg | ORAL_TABLET | Freq: Every evening | ORAL | 3 refills | Status: DC | PRN

## 2018-11-08 NOTE — Assessment & Plan Note (Signed)
Well controlled Continue current medications Recheck metabolic panel F/u in 12 months  

## 2018-11-08 NOTE — Assessment & Plan Note (Signed)
Likely related to leg strength No signs of hypotension/dizziness or vertigo Recommend PT - patient will call back for referral in the winter when she is less busy with golf

## 2018-11-08 NOTE — Patient Instructions (Signed)
Brandy Wong , Thank you for taking time to come for your Medicare Wellness Visit. I appreciate your ongoing commitment to your health goals. Please review the following plan we discussed and let me know if I can assist you in the future.   Screening recommendations/referrals: Colonoscopy: No longer required.  Mammogram: No longer required.  Bone Density: Up to date, due 11/2019 Recommended yearly ophthalmology/optometry visit for glaucoma screening and checkup Recommended yearly dental visit for hygiene and checkup  Vaccinations: Influenza vaccine: Currently due Pneumococcal vaccine: Completed series Tdap vaccine: Up to date, due 01/2027 Shingles vaccine: Pt declines today.     Advanced directives: Currently due  Conditions/risks identified: Continue increasing water intake to 6-8 8 oz glasses of water a day as tolerated.   Next appointment: 10:40 AM with Dr Brita Romp   Preventive Care 12 Years and Older, Female Preventive care refers to lifestyle choices and visits with your health care provider that can promote health and wellness. What does preventive care include?  A yearly physical exam. This is also called an annual well check.  Dental exams once or twice a year.  Routine eye exams. Ask your health care provider how often you should have your eyes checked.  Personal lifestyle choices, including:  Daily care of your teeth and gums.  Regular physical activity.  Eating a healthy diet.  Avoiding tobacco and drug use.  Limiting alcohol use.  Practicing safe sex.  Taking low-dose aspirin every day.  Taking vitamin and mineral supplements as recommended by your health care provider. What happens during an annual well check? The services and screenings done by your health care provider during your annual well check will depend on your age, overall health, lifestyle risk factors, and family history of disease. Counseling  Your health care provider may ask you  questions about your:  Alcohol use.  Tobacco use.  Drug use.  Emotional well-being.  Home and relationship well-being.  Sexual activity.  Eating habits.  History of falls.  Memory and ability to understand (cognition).  Work and work Statistician.  Reproductive health. Screening  You may have the following tests or measurements:  Height, weight, and BMI.  Blood pressure.  Lipid and cholesterol levels. These may be checked every 5 years, or more frequently if you are over 62 years old.  Skin check.  Lung cancer screening. You may have this screening every year starting at age 14 if you have a 30-pack-year history of smoking and currently smoke or have quit within the past 15 years.  Fecal occult blood test (FOBT) of the stool. You may have this test every year starting at age 34.  Flexible sigmoidoscopy or colonoscopy. You may have a sigmoidoscopy every 5 years or a colonoscopy every 10 years starting at age 79.  Hepatitis C blood test.  Hepatitis B blood test.  Sexually transmitted disease (STD) testing.  Diabetes screening. This is done by checking your blood sugar (glucose) after you have not eaten for a while (fasting). You may have this done every 1-3 years.  Bone density scan. This is done to screen for osteoporosis. You may have this done starting at age 20.  Mammogram. This may be done every 1-2 years. Talk to your health care provider about how often you should have regular mammograms. Talk with your health care provider about your test results, treatment options, and if necessary, the need for more tests. Vaccines  Your health care provider may recommend certain vaccines, such as:  Influenza vaccine. This  is recommended every year.  Tetanus, diphtheria, and acellular pertussis (Tdap, Td) vaccine. You may need a Td booster every 10 years.  Zoster vaccine. You may need this after age 43.  Pneumococcal 13-valent conjugate (PCV13) vaccine. One dose is  recommended after age 69.  Pneumococcal polysaccharide (PPSV23) vaccine. One dose is recommended after age 58. Talk to your health care provider about which screenings and vaccines you need and how often you need them. This information is not intended to replace advice given to you by your health care provider. Make sure you discuss any questions you have with your health care provider. Document Released: 03/23/2015 Document Revised: 11/14/2015 Document Reviewed: 12/26/2014 Elsevier Interactive Patient Education  2017 [place] Prevention in the Home Falls can cause injuries. They can happen to people of all ages. There are many things you can do to make your home safe and to help prevent falls. What can I do on the outside of my home?  Regularly fix the edges of walkways and driveways and fix any cracks.  Remove anything that might make you trip as you walk through a door, such as a raised step or threshold.  Trim any bushes or trees on the path to your home.  Use bright outdoor lighting.  Clear any walking paths of anything that might make someone trip, such as rocks or tools.  Regularly check to see if handrails are loose or broken. Make sure that both sides of any steps have handrails.  Any raised decks and porches should have guardrails on the edges.  Have any leaves, snow, or ice cleared regularly.  Use sand or salt on walking paths during winter.  Clean up any spills in your garage right away. This includes oil or grease spills. What can I do in the bathroom?  Use night lights.  Install grab bars by the toilet and in the tub and shower. Do not use towel bars as grab bars.  Use non-skid mats or decals in the tub or shower.  If you need to sit down in the shower, use a plastic, non-slip stool.  Keep the floor dry. Clean up any water that spills on the floor as soon as it happens.  Remove soap buildup in the tub or shower regularly.  Attach bath mats  securely with double-sided non-slip rug tape.  Do not have throw rugs and other things on the floor that can make you trip. What can I do in the bedroom?  Use night lights.  Make sure that you have a light by your bed that is easy to reach.  Do not use any sheets or blankets that are too big for your bed. They should not hang down onto the floor.  Have a firm chair that has side arms. You can use this for support while you get dressed.  Do not have throw rugs and other things on the floor that can make you trip. What can I do in the kitchen?  Clean up any spills right away.  Avoid walking on wet floors.  Keep items that you use a lot in easy-to-reach places.  If you need to reach something above you, use a strong step stool that has a grab bar.  Keep electrical cords out of the way.  Do not use floor polish or wax that makes floors slippery. If you must use wax, use non-skid floor wax.  Do not have throw rugs and other things on the floor that can make you  trip. What can I do with my stairs?  Do not leave any items on the stairs.  Make sure that there are handrails on both sides of the stairs and use them. Fix handrails that are broken or loose. Make sure that handrails are as long as the stairways.  Check any carpeting to make sure that it is firmly attached to the stairs. Fix any carpet that is loose or worn.  Avoid having throw rugs at the top or bottom of the stairs. If you do have throw rugs, attach them to the floor with carpet tape.  Make sure that you have a light switch at the top of the stairs and the bottom of the stairs. If you do not have them, ask someone to add them for you. What else can I do to help prevent falls?  Wear shoes that:  Do not have high heels.  Have rubber bottoms.  Are comfortable and fit you well.  Are closed at the toe. Do not wear sandals.  If you use a stepladder:  Make sure that it is fully opened. Do not climb a closed  stepladder.  Make sure that both sides of the stepladder are locked into place.  Ask someone to hold it for you, if possible.  Clearly mark and make sure that you can see:  Any grab bars or handrails.  First and last steps.  Where the edge of each step is.  Use tools that help you move around (mobility aids) if they are needed. These include:  Canes.  Walkers.  Scooters.  Crutches.  Turn on the lights when you go into a dark area. Replace any light bulbs as soon as they burn out.  Set up your furniture so you have a clear path. Avoid moving your furniture around.  If any of your floors are uneven, fix them.  If there are any pets around you, be aware of where they are.  Review your medicines with your doctor. Some medicines can make you feel dizzy. This can increase your chance of falling. Ask your doctor what other things that you can do to help prevent falls. This information is not intended to replace advice given to you by your health care provider. Make sure you discuss any questions you have with your health care provider. Document Released: 12/21/2008 Document Revised: 08/02/2015 Document Reviewed: 03/31/2014 Elsevier Interactive Patient Education  2017 [employer].

## 2018-11-08 NOTE — Assessment & Plan Note (Signed)
Continue PPI Check CBC 

## 2018-11-08 NOTE — Assessment & Plan Note (Signed)
Recheck Vit D Continue OTC supplement 

## 2018-11-08 NOTE — Patient Instructions (Signed)
Preventive Care 82 Years and Older, Female Preventive care refers to lifestyle choices and visits with your health care provider that can promote health and wellness. This includes:  A yearly physical exam. This is also called an annual well check.  Regular dental and eye exams.  Immunizations.  Screening for certain conditions.  Healthy lifestyle choices, such as diet and exercise. What can I expect for my preventive care visit? Physical exam Your health care provider will check:  Height and weight. These may be used to calculate body mass index (BMI), which is a measurement that tells if you are at a healthy weight.  Heart rate and blood pressure.  Your skin for abnormal spots. Counseling Your health care provider may ask you questions about:  Alcohol, tobacco, and drug use.  Emotional well-being.  Home and relationship well-being.  Sexual activity.  Eating habits.  History of falls.  Memory and ability to understand (cognition).  Work and work Statistician.  Pregnancy and menstrual history. What immunizations do I need?  Influenza (flu) vaccine  This is recommended every year. Tetanus, diphtheria, and pertussis (Tdap) vaccine  You may need a Td booster every 10 years. Varicella (chickenpox) vaccine  You may need this vaccine if you have not already been vaccinated. Zoster (shingles) vaccine  You may need this after age 82. Pneumococcal conjugate (PCV13) vaccine  One dose is recommended after age 82. Pneumococcal polysaccharide (PPSV23) vaccine  One dose is recommended after age 82. Measles, mumps, and rubella (MMR) vaccine  You may need at least one dose of MMR if you were born in 1957 or later. You may also need a second dose. Meningococcal conjugate (MenACWY) vaccine  You may need this if you have certain conditions. Hepatitis A vaccine  You may need this if you have certain conditions or if you travel or work in places where you may be exposed  to hepatitis A. Hepatitis B vaccine  You may need this if you have certain conditions or if you travel or work in places where you may be exposed to hepatitis B. Haemophilus influenzae type b (Hib) vaccine  You may need this if you have certain conditions. You may receive vaccines as individual doses or as more than one vaccine together in one shot (combination vaccines). Talk with your health care provider about the risks and benefits of combination vaccines. What tests do I need? Blood tests  Lipid and cholesterol levels. These may be checked every 5 years, or more frequently depending on your overall health.  Hepatitis C test.  Hepatitis B test. Screening  Lung cancer screening. You may have this screening every year starting at age 82 if you have a 30-pack-year history of smoking and currently smoke or have quit within the past 15 years.  Colorectal cancer screening. All adults should have this screening starting at age 82 and continuing until age 15. Your health care provider may recommend screening at age 23 if you are at increased risk. You will have tests every 1-10 years, depending on your results and the type of screening test.  Diabetes screening. This is done by checking your blood sugar (glucose) after you have not eaten for a while (fasting). You may have this done every 1-3 years.  Mammogram. This may be done every 1-2 years. Talk with your health care provider about how often you should have regular mammograms.  BRCA-related cancer screening. This may be done if you have a family history of breast, ovarian, tubal, or peritoneal cancers.  Other tests  Sexually transmitted disease (STD) testing.  Bone density scan. This is done to screen for osteoporosis. You may have this done starting at age 82. Follow these instructions at home: Eating and drinking  Eat a diet that includes fresh fruits and vegetables, whole grains, lean protein, and low-fat dairy products. Limit  your intake of foods with high amounts of sugar, saturated fats, and salt.  Take vitamin and mineral supplements as recommended by your health care provider.  Do not drink alcohol if your health care provider tells you not to drink.  If you drink alcohol: ? Limit how much you have to 0-1 drink a day. ? Be aware of how much alcohol is in your drink. In the [place] one drink equals one 12 oz bottle of beer (355 mL), one 5 oz glass of wine (148 mL), or one 1 oz glass of hard liquor (44 mL). Lifestyle  Take daily care of your teeth and gums.  Stay active. Exercise for at least 30 minutes on 5 or more days each week.  Do not use any products that contain nicotine or tobacco, such as cigarettes, e-cigarettes, and chewing tobacco. If you need help quitting, ask your health care provider.  If you are sexually active, practice safe sex. Use a condom or other form of protection in order to prevent STIs (sexually transmitted infections).  Talk with your health care provider about taking a low-dose aspirin or statin. What's next?  Go to your health care provider once a year for a well check visit.  Ask your health care provider how often you should have your eyes and teeth checked.  Stay up to date on all vaccines. This information is not intended to replace advice given to you by your health care provider. Make sure you discuss any questions you have with your health care provider. Document Released: 03/23/2015 Document Revised: 02/18/2018 Document Reviewed: 02/18/2018 Elsevier Patient Education  2020 [employer].

## 2018-11-08 NOTE — Assessment & Plan Note (Signed)
Chronic Discussed risks of benadryl in elderly patients Will try trazodone 25-50 mg qhs Could increase to 100mg  if needed Discussed sleep hygiene

## 2018-11-08 NOTE — Progress Notes (Signed)
Patient: Brandy Wong, Female    DOB: June 10, 1934, 84 y.o.   MRN: [id number] Visit Date: 11/08/2018  Today's Provider: Lavon Paganini, MD   Chief Complaint  Patient presents with  . Annual Exam   Subjective:    I, Brandy Wong, CMA, am acting as a scribe for Lavon Paganini, MD.   Patient had a AWE with [facility] today prior to appointment.   Annual physical exam Brandy Wong is a 84 y.o. female who presents today for health maintenance and complete physical. She feels fairly well. She reports exercising 5 days per week yogo, and 2 days per week playing golf. She reports she is sleeping poorly.  Takes Tylenol PM every other night.  Difficulty getting to sleep and staying asleep.  Leaves TV on to keep mind from   Feels off balance. Plays golf and has difficulty walking on the uneven ground. Tries to be very careful.    -----------------------------------------------------------------   Review of Systems  Constitutional: Negative.   HENT: Negative.   Eyes: Negative.   Respiratory: Negative.   Cardiovascular: Negative.   Gastrointestinal: Negative.   Endocrine: Negative.   Genitourinary: Negative.   Musculoskeletal: Negative.   Skin: Negative.   Allergic/Immunologic: Negative.   Neurological: Negative.   Hematological: Negative.   Psychiatric/Behavioral: Negative.     Social History      She  reports that she quit smoking about 31 years ago. She has a 1.50 pack-year smoking history. She has never used smokeless tobacco. She reports current alcohol use of about 7.0 standard drinks of alcohol per week. She reports that she does not use drugs.       Social History   Socioeconomic History  . Marital status: Widowed    Spouse name: Not on file  . Number of children: 2  . Years of education: some college  . Highest education level: Some college, no degree  Occupational History    Employer: RETIRED  Social Needs  . Financial resource strain: Not hard at all   . Food insecurity    Worry: Never true    Inability: Never true  . Transportation needs    Medical: No    Non-medical: No  Tobacco Use  . Smoking status: Former Smoker    Packs/day: 0.15    Years: 10.00    Pack years: 1.50    Quit date: 03/10/1987    Years since quitting: 31.6  . Smokeless tobacco: Never Used  Substance and Sexual Activity  . Alcohol use: Yes    Alcohol/week: 7.0 standard drinks    Types: 7 Glasses of wine per week  . Drug use: No  . Sexual activity: Never  Lifestyle  . Physical activity    Days per week: 0 days    Minutes per session: 0 min  . Stress: Not at all  Relationships  . Social Herbalist on phone: Patient refused    Gets together: Patient refused    Attends religious service: Patient refused    Active member of club or organization: Patient refused    Attends meetings of clubs or organizations: Patient refused    Relationship status: Patient refused  Other Topics Concern  . Not on file  Social History Narrative  . Not on file    Past Medical History:  Diagnosis Date  . Cancer [facility]) 2013   R breast, s/p radiation and lumpectomy  . GERD (gastroesophageal reflux disease)   . Hypertension  Patient Active Problem List   Diagnosis Date Noted  . GERD (gastroesophageal reflux disease) 10/31/2016  . HTN (hypertension) 10/31/2016  . Avitaminosis D 10/31/2016  . History of uterine prolapse 05/19/2013  . Malignant neoplasm of lower-outer quadrant of right female breast ([place]) 11/05/2012    Past Surgical History:  Procedure Laterality Date  . APPENDECTOMY    . BREAST SURGERY  2013   lumpectomy  . CATARACT EXTRACTION    . LAPAROSCOPIC VAGINAL HYSTERECTOMY WITH SALPINGO OOPHORECTOMY    . TUBAL LIGATION      Family History        Family Status  Relation Name Status  . Mother  Deceased  . Father  Deceased        Her family history includes Dementia (age of onset: 45) in her mother; GER disease in her mother; Heart  attack in her father; Stroke in her father.      Allergies  Allergen Reactions  . Amoxicillin Rash     Current Outpatient Medications:  .  calcium carbonate (OSCAL) 1500 (600 Ca) MG TABS tablet, Take 600 mg of elemental calcium by mouth 2 (two) times daily with a meal., Disp: , Rfl:  .  Cholecalciferol (VITAMIN D-1000 MAX ST) 1000 units tablet, Take 1 tablet by mouth daily., Disp: , Rfl:  .  Cranberry 450 MG CAPS, Take 450 mg by mouth daily., Disp: , Rfl:  .  diphenhydramine-acetaminophen (TYLENOL PM) 25-500 MG TABS tablet, Take 1 tablet by mouth at bedtime as needed (every other night)., Disp: , Rfl:  .  hydrochlorothiazide (HYDRODIURIL) 25 MG tablet, Take 1 tablet (25 mg total) by mouth daily., Disp: 90 tablet, Rfl: 3 .  MAGNESIUM PO, Take 1 capsule by mouth daily., Disp: , Rfl:  .  omeprazole (PRILOSEC) 20 MG capsule, Take 1 capsule (20 mg total) by mouth daily., Disp: 90 capsule, Rfl: 3 .  Polyethyl Glycol-Propyl Glycol (SYSTANE OP), Apply 1 drop to eye 4 (four) times daily as needed., Disp: , Rfl:  .  TURMERIC PO, Take 1 tablet by mouth daily., Disp: , Rfl:  .  vitamin C (ASCORBIC ACID) 500 MG tablet, Take 500 mg by mouth daily., Disp: , Rfl:    Patient Care Team: Virginia Crews, MD as PCP - General (Family Medicine) Alanda Slim Neena Rhymes, MD as Consulting Physician (Ophthalmology)    Objective:    Vitals: BP 118/75 (BP Location: Right Arm, Patient Position: Sitting, Cuff Size: Normal)   Pulse 84   Temp (!) 97.3 F (36.3 C) (Oral)   Ht 5\' 1"  (1.549 m)   Wt 114 lb (51.7 kg)   BMI 21.54 kg/m    Vitals:   11/08/18 1052  BP: 118/75  Pulse: 84  Temp: (!) 97.3 F (36.3 C)  TempSrc: Oral  Weight: 114 lb (51.7 kg)  Height: 5\' 1"  (1.549 m)     Physical Exam Vitals signs reviewed.  Constitutional:      General: She is not in acute distress.    Appearance: Normal appearance. She is well-developed. She is not diaphoretic.  HENT:     Head: Normocephalic and  atraumatic.     Right Ear: Tympanic membrane, ear canal and external ear normal.     Left Ear: Tympanic membrane, ear canal and external ear normal.  Eyes:     General: No scleral icterus.    Conjunctiva/sclera: Conjunctivae normal.     Pupils: Pupils are equal, round, and reactive to light.  Neck:     Musculoskeletal: Neck  supple.     Thyroid: No thyromegaly.  Cardiovascular:     Rate and Rhythm: Normal rate and regular rhythm.     Pulses: Normal pulses.     Heart sounds: Normal heart sounds. No murmur.  Pulmonary:     Effort: Pulmonary effort is normal. No respiratory distress.     Breath sounds: Normal breath sounds. No wheezing or rales.  Abdominal:     General: Bowel sounds are normal. There is no distension.     Palpations: Abdomen is soft.     Tenderness: There is no abdominal tenderness. There is no guarding or rebound.  Musculoskeletal:        General: No deformity.     Right lower leg: No edema.     Left lower leg: No edema.  Lymphadenopathy:     Cervical: No cervical adenopathy.  Skin:    General: Skin is warm and dry.     Capillary Refill: Capillary refill takes less than 2 seconds.     Findings: No rash.  Neurological:     Mental Status: She is alert and oriented to person, place, and time. Mental status is at baseline.     Sensory: No sensory deficit.     Motor: No weakness.     Gait: Gait normal.  Psychiatric:        Mood and Affect: Mood normal.        Behavior: Behavior normal.        Thought Content: Thought content normal.      Depression Screen PHQ 2/9 Scores 11/08/2018 11/03/2017 11/03/2017 10/31/2016  PHQ - 2 Score 0 0 0 0  PHQ- 9 Score - 0 - -       Assessment & Plan:     Routine Health Maintenance and Physical Exam  Exercise Activities and Dietary recommendations Goals    . DIET - INCREASE WATER INTAKE     Recommend increasing water intake to 3 glasses a day.        Immunization History  Administered Date(s) Administered  . Fluad  Quad(high Dose 65+) 11/08/2018  . Influenza, High Dose Seasonal PF 10/31/2016, 11/11/2017  . Pneumococcal Conjugate-13 11/22/2014  . Pneumococcal Polysaccharide-23 01/19/2017  . Td 01/19/2017    Health Maintenance  Topic Date Due  . INFLUENZA VACCINE  10/09/2018  . DEXA SCAN  12/04/2019  . TETANUS/TDAP  01/20/2027  . PNA vac Low Risk Adult  Completed     Discussed health benefits of physical activity, and encouraged her to engage in regular exercise appropriate for her age and condition.    --------------------------------------------------------------------  Problem List Items Addressed This Visit      Cardiovascular and Mediastinum   HTN (hypertension)    Well controlled Continue current medications Recheck metabolic panel F/u in 12 months       Relevant Orders   Comprehensive metabolic panel   Lipid panel     Digestive   GERD (gastroesophageal reflux disease)    Continue PPI Check CBC      Relevant Orders   CBC w/Diff/Platelet     Other   Avitaminosis D    Recheck Vit D Continue OTC supplement      Relevant Orders   VITAMIN D 25 Hydroxy (Vit-D Deficiency, Fractures)   Insomnia    Chronic Discussed risks of benadryl in elderly patients Will try trazodone 25-50 mg qhs Could increase to 100mg  if needed Discussed sleep hygiene       Balance problem    Likely related  to leg strength No signs of hypotension/dizziness or vertigo Recommend PT - patient will call back for referral in the winter when she is less busy with golf       Other Visit Diagnoses    Encounter for annual physical exam    -  Primary   Relevant Orders   Comprehensive metabolic panel   Lipid panel   VITAMIN D 25 Hydroxy (Vit-D Deficiency, Fractures)   CBC w/Diff/Platelet   Need for influenza vaccination       Relevant Orders   Flu Vaccine QUAD High Dose(Fluad) (Completed)       Return in about 1 year (around 11/08/2019) for CPE/AWV.   The entirety of the information  documented in the History of Present Illness, Review of Systems and Physical Exam were personally obtained by me. Portions of this information were initially documented by Brandy Wong, CMA and reviewed by me for thoroughness and accuracy.    Bacigalupo, Dionne Bucy, MD MPH [facility]

## 2018-11-09 ENCOUNTER — Telehealth: Payer: Self-pay

## 2018-11-09 DIAGNOSIS — H43393 Other vitreous opacities, bilateral: Secondary | ICD-10-CM | POA: Diagnosis not present

## 2018-11-09 DIAGNOSIS — H4322 Crystalline deposits in vitreous body, left eye: Secondary | ICD-10-CM | POA: Diagnosis not present

## 2018-11-09 DIAGNOSIS — H43813 Vitreous degeneration, bilateral: Secondary | ICD-10-CM | POA: Diagnosis not present

## 2018-11-09 DIAGNOSIS — H35373 Puckering of macula, bilateral: Secondary | ICD-10-CM | POA: Diagnosis not present

## 2018-11-09 LAB — CBC WITH DIFFERENTIAL/PLATELET
Basophils Absolute: 0.1 10*3/uL (ref 0.0–0.2)
Basos: 1 %
EOS (ABSOLUTE): 0.2 10*3/uL (ref 0.0–0.4)
Eos: 2 %
Hematocrit: 43.1 % (ref 34.0–46.6)
Hemoglobin: 14.9 g/dL (ref 11.1–15.9)
Immature Grans (Abs): 0 10*3/uL (ref 0.0–0.1)
Immature Granulocytes: 0 %
Lymphocytes Absolute: 2.2 10*3/uL (ref 0.7–3.1)
Lymphs: 27 %
MCH: 32.3 pg (ref 26.6–33.0)
MCHC: 34.6 g/dL (ref 31.5–35.7)
MCV: 93 fL (ref 79–97)
Monocytes Absolute: 0.6 10*3/uL (ref 0.1–0.9)
Monocytes: 8 %
Neutrophils Absolute: 5 10*3/uL (ref 1.4–7.0)
Neutrophils: 62 %
Platelets: 429 10*3/uL (ref 150–450)
RBC: 4.62 x10E6/uL (ref 3.77–5.28)
RDW: 12.3 % (ref 11.7–15.4)
WBC: 8.1 10*3/uL (ref 3.4–10.8)

## 2018-11-09 LAB — COMPREHENSIVE METABOLIC PANEL
ALT: 13 IU/L (ref 0–32)
AST: 22 IU/L (ref 0–40)
Albumin/Globulin Ratio: 1.8 (ref 1.2–2.2)
Albumin: 4.6 g/dL (ref 3.6–4.6)
Alkaline Phosphatase: 67 IU/L (ref 39–117)
BUN/Creatinine Ratio: 17 (ref 12–28)
BUN: 11 mg/dL (ref 8–27)
Bilirubin Total: 0.3 mg/dL (ref 0.0–1.2)
CO2: 24 mmol/L (ref 20–29)
Calcium: 10.4 mg/dL — ABNORMAL HIGH (ref 8.7–10.3)
Chloride: 97 mmol/L (ref 96–106)
Creatinine, Ser: 0.63 mg/dL (ref 0.57–1.00)
GFR calc Af Amer: 97 mL/min/{1.73_m2} (ref >59)
GFR calc non Af Amer: 84 mL/min/{1.73_m2} (ref >59)
Globulin, Total: 2.6 g/dL (ref 1.5–4.5)
Glucose: 94 mg/dL (ref 65–99)
Potassium: 4.2 mmol/L (ref 3.5–5.2)
Sodium: 137 mmol/L (ref 134–144)
Total Protein: 7.2 g/dL (ref 6.0–8.5)

## 2018-11-09 LAB — LIPID PANEL
Chol/HDL Ratio: 2.7 ratio (ref 0.0–4.4)
Cholesterol, Total: 261 mg/dL — ABNORMAL HIGH (ref 100–199)
HDL: 95 mg/dL (ref >39)
LDL Chol Calc (NIH): 145 mg/dL — ABNORMAL HIGH (ref 0–99)
Triglycerides: 125 mg/dL (ref 0–149)
VLDL Cholesterol Cal: 21 mg/dL (ref 5–40)

## 2018-11-09 LAB — VITAMIN D 25 HYDROXY (VIT D DEFICIENCY, FRACTURES): Vit D, 25-Hydroxy: 59.1 ng/mL (ref 30.0–100.0)

## 2018-11-09 NOTE — Telephone Encounter (Signed)
Pt returned call ° °teri °

## 2018-11-09 NOTE — Telephone Encounter (Signed)
Patient advised.

## 2018-11-09 NOTE — Telephone Encounter (Signed)
LMTCB

## 2018-11-09 NOTE — Telephone Encounter (Signed)
-----   Message from Virginia Crews, MD sent at 11/09/2018  8:46 AM EDT ----- Normal labs. Stable cholesterol. Calcium is slightly high. Recommend cutting back on supplement if taking any.

## 2018-11-25 DIAGNOSIS — H26492 Other secondary cataract, left eye: Secondary | ICD-10-CM | POA: Diagnosis not present

## 2018-11-25 DIAGNOSIS — H3581 Retinal edema: Secondary | ICD-10-CM | POA: Diagnosis not present

## 2018-11-25 DIAGNOSIS — H35372 Puckering of macula, left eye: Secondary | ICD-10-CM | POA: Diagnosis not present

## 2018-11-25 DIAGNOSIS — H26491 Other secondary cataract, right eye: Secondary | ICD-10-CM | POA: Diagnosis not present

## 2018-11-26 DIAGNOSIS — H35372 Puckering of macula, left eye: Secondary | ICD-10-CM | POA: Diagnosis not present

## 2018-11-30 ENCOUNTER — Other Ambulatory Visit: Payer: Self-pay | Admitting: Family Medicine

## 2018-11-30 NOTE — Telephone Encounter (Signed)
L.O.V. was on 11/08/2018.

## 2018-12-16 DIAGNOSIS — Z1231 Encounter for screening mammogram for malignant neoplasm of breast: Secondary | ICD-10-CM | POA: Diagnosis not present

## 2018-12-16 DIAGNOSIS — Z853 Personal history of malignant neoplasm of breast: Secondary | ICD-10-CM | POA: Diagnosis not present

## 2018-12-16 LAB — HM MAMMOGRAPHY

## 2018-12-24 DIAGNOSIS — H3581 Retinal edema: Secondary | ICD-10-CM | POA: Diagnosis not present

## 2018-12-27 DIAGNOSIS — M5033 Other cervical disc degeneration, cervicothoracic region: Secondary | ICD-10-CM | POA: Diagnosis not present

## 2018-12-27 DIAGNOSIS — M5134 Other intervertebral disc degeneration, thoracic region: Secondary | ICD-10-CM | POA: Diagnosis not present

## 2018-12-27 DIAGNOSIS — M9901 Segmental and somatic dysfunction of cervical region: Secondary | ICD-10-CM | POA: Diagnosis not present

## 2018-12-27 DIAGNOSIS — M9902 Segmental and somatic dysfunction of thoracic region: Secondary | ICD-10-CM | POA: Diagnosis not present

## 2018-12-30 DIAGNOSIS — M9901 Segmental and somatic dysfunction of cervical region: Secondary | ICD-10-CM | POA: Diagnosis not present

## 2018-12-30 DIAGNOSIS — M9902 Segmental and somatic dysfunction of thoracic region: Secondary | ICD-10-CM | POA: Diagnosis not present

## 2018-12-30 DIAGNOSIS — M5134 Other intervertebral disc degeneration, thoracic region: Secondary | ICD-10-CM | POA: Diagnosis not present

## 2018-12-30 DIAGNOSIS — M5033 Other cervical disc degeneration, cervicothoracic region: Secondary | ICD-10-CM | POA: Diagnosis not present

## 2019-01-14 ENCOUNTER — Telehealth: Payer: Self-pay | Admitting: *Deleted

## 2019-01-14 NOTE — Telephone Encounter (Signed)
OK. Inhaling a pill is different from swallowing a pill.  That could lead to aspiration pneumonia.  We could get an XRay, but we won't have results before end of day though.

## 2019-01-14 NOTE — Telephone Encounter (Signed)
Does she think that she aspirated?  Simply swallowing a pill cannot cause pneumonia. Need more info on symptoms. May be able to offer virtual visit as well.

## 2019-01-14 NOTE — Telephone Encounter (Signed)
Called and spoke with patient and notified her that Dr. Jacinto Reap does not believe her inhaling her pill would cause her to develop pneumonia but if she was concerned to schedule a virtual vcisit. The patient also stated she is having a productive cough since she inhaled the pill that gets worse at night when she lays down. She declined scheduling a virtual visit at this time and was advised that she could also go to an Urgent care or [facility] to be seen in person. She gave verbal understanding.

## 2019-01-14 NOTE — Telephone Encounter (Signed)
Patient states she swallowed a pill 2 days ago and since then she has had chest congestion. Patient is worried she will get pneumonia. Patient wanted Dr. B or one of her CMA's to call her back.

## 2019-01-17 ENCOUNTER — Telehealth: Payer: Self-pay | Admitting: Family Medicine

## 2019-01-17 DIAGNOSIS — L538 Other specified erythematous conditions: Secondary | ICD-10-CM | POA: Diagnosis not present

## 2019-01-17 DIAGNOSIS — R0989 Other specified symptoms and signs involving the circulatory and respiratory systems: Secondary | ICD-10-CM

## 2019-01-17 DIAGNOSIS — D2261 Melanocytic nevi of right upper limb, including shoulder: Secondary | ICD-10-CM | POA: Diagnosis not present

## 2019-01-17 DIAGNOSIS — L570 Actinic keratosis: Secondary | ICD-10-CM | POA: Diagnosis not present

## 2019-01-17 DIAGNOSIS — D2262 Melanocytic nevi of left upper limb, including shoulder: Secondary | ICD-10-CM | POA: Diagnosis not present

## 2019-01-17 DIAGNOSIS — Z85828 Personal history of other malignant neoplasm of skin: Secondary | ICD-10-CM | POA: Diagnosis not present

## 2019-01-17 DIAGNOSIS — X32XXXA Exposure to sunlight, initial encounter: Secondary | ICD-10-CM | POA: Diagnosis not present

## 2019-01-17 DIAGNOSIS — L820 Inflamed seborrheic keratosis: Secondary | ICD-10-CM | POA: Diagnosis not present

## 2019-01-17 DIAGNOSIS — D485 Neoplasm of uncertain behavior of skin: Secondary | ICD-10-CM | POA: Diagnosis not present

## 2019-01-17 DIAGNOSIS — D2272 Melanocytic nevi of left lower limb, including hip: Secondary | ICD-10-CM | POA: Diagnosis not present

## 2019-01-17 NOTE — Telephone Encounter (Signed)
Patient advised as below.  

## 2019-01-17 NOTE — Telephone Encounter (Signed)
Pt requesting a chest Xray from aspirating a vitamin in her lungs last week.  Spoke with the [place] [facility] and is still requesting and order asap.  Please call pt back to let her know on this.  Thanks, [employer]

## 2019-01-17 NOTE — Telephone Encounter (Signed)
Ok to order 2 view CXR for possible aspiration.  Please note that the phone note from last week said that she had swallowed a vitamin, which is very different from aspiration.

## 2019-01-17 NOTE — Telephone Encounter (Signed)
Lmtcb, order for x-ray has been placed for outpatient imaging at [facility].

## 2019-01-18 ENCOUNTER — Other Ambulatory Visit: Payer: Self-pay

## 2019-01-18 ENCOUNTER — Ambulatory Visit
Admission: RE | Admit: 2019-01-18 | Discharge: 2019-01-18 | Disposition: A | Discharge status: Home / Self Care | Payer: Medicare HMO | Source: Ambulatory Visit | Attending: Family Medicine | Admitting: Family Medicine

## 2019-01-18 ENCOUNTER — Telehealth: Payer: Self-pay

## 2019-01-18 DIAGNOSIS — R0989 Other specified symptoms and signs involving the circulatory and respiratory systems: Secondary | ICD-10-CM

## 2019-01-18 DIAGNOSIS — Z0389 Encounter for observation for other suspected diseases and conditions ruled out: Secondary | ICD-10-CM | POA: Diagnosis not present

## 2019-01-18 NOTE — Telephone Encounter (Signed)
-----   Message from Virginia Crews, MD sent at 01/18/2019 11:52 AM EST ----- CXR is normal.  If still having issues, can have OV.

## 2019-01-18 NOTE — Telephone Encounter (Signed)
Patient advised as below.  

## 2019-03-22 DIAGNOSIS — Z0100 Encounter for examination of eyes and vision without abnormal findings: Secondary | ICD-10-CM | POA: Diagnosis not present

## 2019-04-18 DIAGNOSIS — R69 Illness, unspecified: Secondary | ICD-10-CM | POA: Diagnosis not present

## 2019-05-03 DIAGNOSIS — R69 Illness, unspecified: Secondary | ICD-10-CM | POA: Diagnosis not present

## 2019-07-19 DIAGNOSIS — D2262 Melanocytic nevi of left upper limb, including shoulder: Secondary | ICD-10-CM | POA: Diagnosis not present

## 2019-07-19 DIAGNOSIS — X32XXXA Exposure to sunlight, initial encounter: Secondary | ICD-10-CM | POA: Diagnosis not present

## 2019-07-19 DIAGNOSIS — D2271 Melanocytic nevi of right lower limb, including hip: Secondary | ICD-10-CM | POA: Diagnosis not present

## 2019-07-19 DIAGNOSIS — L570 Actinic keratosis: Secondary | ICD-10-CM | POA: Diagnosis not present

## 2019-07-19 DIAGNOSIS — D225 Melanocytic nevi of trunk: Secondary | ICD-10-CM | POA: Diagnosis not present

## 2019-07-19 DIAGNOSIS — Z85828 Personal history of other malignant neoplasm of skin: Secondary | ICD-10-CM | POA: Diagnosis not present

## 2019-07-19 DIAGNOSIS — D2261 Melanocytic nevi of right upper limb, including shoulder: Secondary | ICD-10-CM | POA: Diagnosis not present

## 2019-10-13 DIAGNOSIS — M9903 Segmental and somatic dysfunction of lumbar region: Secondary | ICD-10-CM | POA: Diagnosis not present

## 2019-10-13 DIAGNOSIS — M47816 Spondylosis without myelopathy or radiculopathy, lumbar region: Secondary | ICD-10-CM | POA: Diagnosis not present

## 2019-10-13 DIAGNOSIS — M9901 Segmental and somatic dysfunction of cervical region: Secondary | ICD-10-CM | POA: Diagnosis not present

## 2019-10-13 DIAGNOSIS — M9902 Segmental and somatic dysfunction of thoracic region: Secondary | ICD-10-CM | POA: Diagnosis not present

## 2019-10-26 ENCOUNTER — Telehealth: Payer: Self-pay

## 2019-10-26 NOTE — Telephone Encounter (Signed)
Copied from [place] [id number]. Topic: General - Call Back - No Documentation >> Oct 26, 2019 10:37 AM Erick Blinks wrote: Reason for CRM: Pt called requesting to speak to stephanie, please advise. "Needs to straighten out some dates"  Best contact [phone]

## 2019-10-26 NOTE — Telephone Encounter (Signed)
Patient called back to check if AWV was okay to be done a day after her CPE. Please advise.

## 2019-10-26 NOTE — Telephone Encounter (Signed)
Spoke with pt and advised her that it IS ok to have the AWV after the CPE, though we prefer it before. Currently there is no availability prior to the CPE apt but I advised her I would add her to my cancellation list and call her if an apt opens up. Pt agreed. FYI to PCP.

## 2019-10-26 NOTE — Telephone Encounter (Signed)
Copied from [place] [id number]. Topic: General - Call Back - No Documentation >> Oct 26, 2019 10:37 AM Erick Blinks wrote: Reason for CRM: Pt called requesting to speak to stephanie, please advise. "Needs to straighten out some dates"  Best contact ([phone] >> Oct 26, 2019 11:27 AM Alanda Slim E wrote: Pt called and stated that someone from this office named stephanie smith called her about her AWV apt / Pt called to ask her about why the appt is after her CPE appt and no before/ I advised Pt that Alyson Ingles will give her a call about this appt/ please advise

## 2019-10-26 NOTE — Telephone Encounter (Signed)
Contacted patient because there is not a medical staff member with that name, when I inquired how I can help phone was hung up. If patient returns call can we inquire about reason for call? Thanks. KW

## 2019-11-02 ENCOUNTER — Other Ambulatory Visit: Payer: Self-pay | Admitting: Family Medicine

## 2019-11-02 DIAGNOSIS — I10 Essential (primary) hypertension: Secondary | ICD-10-CM

## 2019-11-02 NOTE — Telephone Encounter (Signed)
Appt scheduled on 11/08/19.

## 2019-11-04 ENCOUNTER — Other Ambulatory Visit: Payer: Self-pay | Admitting: Family Medicine

## 2019-11-04 DIAGNOSIS — K219 Gastro-esophageal reflux disease without esophagitis: Secondary | ICD-10-CM

## 2019-11-08 ENCOUNTER — Ambulatory Visit (INDEPENDENT_AMBULATORY_CARE_PROVIDER_SITE_OTHER): Payer: Medicare HMO | Admitting: Family Medicine

## 2019-11-08 ENCOUNTER — Encounter: Payer: Self-pay | Admitting: Family Medicine

## 2019-11-08 ENCOUNTER — Other Ambulatory Visit: Payer: Self-pay

## 2019-11-08 VITALS — BP 142/80 | HR 74 | Temp 98.4°F | Ht 61.0 in | Wt 118.0 lb

## 2019-11-08 DIAGNOSIS — I10 Essential (primary) hypertension: Secondary | ICD-10-CM

## 2019-11-08 DIAGNOSIS — M25561 Pain in right knee: Secondary | ICD-10-CM | POA: Insufficient documentation

## 2019-11-08 DIAGNOSIS — G4700 Insomnia, unspecified: Secondary | ICD-10-CM | POA: Diagnosis not present

## 2019-11-08 DIAGNOSIS — R2689 Other abnormalities of gait and mobility: Secondary | ICD-10-CM | POA: Diagnosis not present

## 2019-11-08 DIAGNOSIS — K219 Gastro-esophageal reflux disease without esophagitis: Secondary | ICD-10-CM | POA: Diagnosis not present

## 2019-11-08 DIAGNOSIS — Z853 Personal history of malignant neoplasm of breast: Secondary | ICD-10-CM

## 2019-11-08 DIAGNOSIS — E559 Vitamin D deficiency, unspecified: Secondary | ICD-10-CM | POA: Diagnosis not present

## 2019-11-08 DIAGNOSIS — Z0000 Encounter for general adult medical examination without abnormal findings: Secondary | ICD-10-CM

## 2019-11-08 NOTE — Assessment & Plan Note (Signed)
Well controlled Continue current medications Recheck metabolic panel F/u in 12 months  

## 2019-11-08 NOTE — Patient Instructions (Signed)
Preventive Care 51 Years and Older, Female Preventive care refers to lifestyle choices and visits with your health care provider that can promote health and wellness. This includes:  A yearly physical exam. This is also called an annual well check.  Regular dental and eye exams.  Immunizations.  Screening for certain conditions.  Healthy lifestyle choices, such as diet and exercise. What can I expect for my preventive care visit? Physical exam Your health care provider will check:  Height and weight. These may be used to calculate body mass index (BMI), which is a measurement that tells if you are at a healthy weight.  Heart rate and blood pressure.  Your skin for abnormal spots. Counseling Your health care provider may ask you questions about:  Alcohol, tobacco, and drug use.  Emotional well-being.  Home and relationship well-being.  Sexual activity.  Eating habits.  History of falls.  Memory and ability to understand (cognition).  Work and work Statistician.  Pregnancy and menstrual history. What immunizations do I need?  Influenza (flu) vaccine  This is recommended every year. Tetanus, diphtheria, and pertussis (Tdap) vaccine  You may need a Td booster every 10 years. Varicella (chickenpox) vaccine  You may need this vaccine if you have not already been vaccinated. Zoster (shingles) vaccine  You may need this after age 39. Pneumococcal conjugate (PCV13) vaccine  One dose is recommended after age 73. Pneumococcal polysaccharide (PPSV23) vaccine  One dose is recommended after age 13. Measles, mumps, and rubella (MMR) vaccine  You may need at least one dose of MMR if you were born in 1957 or later. You may also need a second dose. Meningococcal conjugate (MenACWY) vaccine  You may need this if you have certain conditions. Hepatitis A vaccine  You may need this if you have certain conditions or if you travel or work in places where you may be exposed  to hepatitis A. Hepatitis B vaccine  You may need this if you have certain conditions or if you travel or work in places where you may be exposed to hepatitis B. Haemophilus influenzae type b (Hib) vaccine  You may need this if you have certain conditions. You may receive vaccines as individual doses or as more than one vaccine together in one shot (combination vaccines). Talk with your health care provider about the risks and benefits of combination vaccines. What tests do I need? Blood tests  Lipid and cholesterol levels. These may be checked every 5 years, or more frequently depending on your overall health.  Hepatitis C test.  Hepatitis B test. Screening  Lung cancer screening. You may have this screening every year starting at age 69 if you have a 30-pack-year history of smoking and currently smoke or have quit within the past 15 years.  Colorectal cancer screening. All adults should have this screening starting at age 71 and continuing until age 4. Your health care provider may recommend screening at age 64 if you are at increased risk. You will have tests every 1-10 years, depending on your results and the type of screening test.  Diabetes screening. This is done by checking your blood sugar (glucose) after you have not eaten for a while (fasting). You may have this done every 1-3 years.  Mammogram. This may be done every 1-2 years. Talk with your health care provider about how often you should have regular mammograms.  BRCA-related cancer screening. This may be done if you have a family history of breast, ovarian, tubal, or peritoneal cancers.  Other tests °· Sexually transmitted disease (STD) testing. °· Bone density scan. This is done to screen for osteoporosis. You may have this done starting at age 65. °Follow these instructions at home: °Eating and drinking °· Eat a diet that includes fresh fruits and vegetables, whole grains, lean protein, and low-fat dairy products. Limit  your intake of foods with high amounts of sugar, saturated fats, and salt. °· Take vitamin and mineral supplements as recommended by your health care provider. °· Do not drink alcohol if your health care provider tells you not to drink. °· If you drink alcohol: °? Limit how much you have to 0-1 drink a day. °? Be aware of how much alcohol is in your drink. In the [place] one drink equals one 12 oz bottle of beer (355 mL), one 5 oz glass of wine (148 mL), or one 1½ oz glass of hard liquor (44 mL). °Lifestyle °· Take daily care of your teeth and gums. °· Stay active. Exercise for at least 30 minutes on 5 or more days each week. °· Do not use any products that contain nicotine or tobacco, such as cigarettes, e-cigarettes, and chewing tobacco. If you need help quitting, ask your health care provider. °· If you are sexually active, practice safe sex. Use a condom or other form of protection in order to prevent STIs (sexually transmitted infections). °· Talk with your health care provider about taking a low-dose aspirin or statin. °What's next? °· Go to your health care provider once a year for a well check visit. °· Ask your health care provider how often you should have your eyes and teeth checked. °· Stay up to date on all vaccines. °This information is not intended to replace advice given to you by your health care provider. Make sure you discuss any questions you have with your health care provider. °Document Revised: 02/18/2018 Document Reviewed: 02/18/2018 °Elsevier Patient Education © 2020 [employer] ° °

## 2019-11-08 NOTE — Progress Notes (Signed)
I,Laura E Walsh,acting as a scribe for Brandy Paganini, MD.,have documented all relevant documentation on the behalf of Brandy Paganini, MD,as directed by  Brandy Paganini, MD while in the presence of Brandy Paganini, MD.   Complete physical exam     Patient: Brandy Wong, Female    DOB: 08-08-1934, 85 y.o.   MRN: [id number] Visit Date: 11/08/2019  Today's Provider: Lavon Paganini, MD   Chief Complaint  Patient presents with  . Annual Exam   Subjective    Brandy Wong is a 85 y.o. female who presents today for her Annual Wellness Visit. She reports consuming a general diet. Exercises regularly She generally feels well. She reports sleeping fairly well. She does have additional problems to discuss today.   HPI   Pt is concern about her balance, she has fallen twice this past year.  Also complains of leg weakness.  Most recent about three weeks ago.  Hurt right knee.  Would like a referral for physical therapy for both knee pain and balance.    Patient Active Problem List   Diagnosis Date Noted  . Insomnia 11/08/2018  . Balance problem 11/08/2018  . GERD (gastroesophageal reflux disease) 10/31/2016  . HTN (hypertension) 10/31/2016  . Avitaminosis D 10/31/2016  . History of uterine prolapse 05/19/2013  . Malignant neoplasm of lower-outer quadrant of right female breast ([place]) 11/05/2012   Past Medical History:  Diagnosis Date  . Cancer [facility]) 2013   R breast, s/p radiation and lumpectomy  . GERD (gastroesophageal reflux disease)   . Hypertension    Social History   Tobacco Use  . Smoking status: Former Smoker    Packs/day: 0.15    Years: 10.00    Pack years: 1.50    Quit date: 03/10/1987    Years since quitting: 32.6  . Smokeless tobacco: Never Used  Vaping Use  . Vaping Use: Never used  Substance Use Topics  . Alcohol use: Yes    Alcohol/week: 7.0 standard drinks    Types: 7 Glasses of wine per week  . Drug use: No   Allergies  Allergen  Reactions  . Amoxicillin Rash     Medications: Outpatient Medications Prior to Visit  Medication Sig  . calcium carbonate (OSCAL) 1500 (600 Ca) MG TABS tablet Take 600 mg of elemental calcium by mouth 2 (two) times daily with a meal.  . Cholecalciferol (VITAMIN D-1000 MAX ST) 1000 units tablet Take 1 tablet by mouth daily.  . diphenhydramine-acetaminophen (TYLENOL PM) 25-500 MG TABS tablet Take 1 tablet by mouth at bedtime as needed (every other night).  . hydrochlorothiazide (HYDRODIURIL) 25 MG tablet TAKE 1 TABLET BY MOUTH EVERY DAY  . MAGNESIUM PO Take 1 capsule by mouth daily.  Marland Kitchen omeprazole (PRILOSEC) 20 MG capsule TAKE 1 CAPSULE BY MOUTH EVERY DAY  . Polyethyl Glycol-Propyl Glycol (SYSTANE OP) Apply 1 drop to eye 4 (four) times daily as needed.  . TURMERIC PO Take 1 tablet by mouth daily.  . Cranberry 450 MG CAPS Take 450 mg by mouth daily.  Marland Kitchen SHINGRIX injection   . traZODone (DESYREL) 50 MG tablet TAKE 0.5-1 TABLETS (25-50 MG TOTAL) BY MOUTH AT BEDTIME AS NEEDED FOR SLEEP.  Marland Kitchen vitamin C (ASCORBIC ACID) 500 MG tablet Take 500 mg by mouth daily.   No facility-administered medications prior to visit.    Allergies  Allergen Reactions  . Amoxicillin Rash    Patient Care Team: Virginia Crews, MD as PCP - General (Family Medicine) [place],  Neena Rhymes, MD as Consulting Physician (Ophthalmology)  Review of Systems  Constitutional: Negative.   HENT: Negative.   Eyes: Negative.   Respiratory: Negative.   Cardiovascular: Negative.   Gastrointestinal: Negative.   Endocrine: Negative.   Genitourinary: Negative.   Musculoskeletal: Positive for arthralgias, back pain and gait problem. Negative for joint swelling, myalgias, neck pain and neck stiffness.  Skin: Negative.   Allergic/Immunologic: Negative.   Hematological: Negative.   Psychiatric/Behavioral: Negative.     Objective    Vitals: BP (!) 142/80 (BP Location: Left Arm, Patient Position: Sitting, Cuff Size: Normal)    Pulse 74   Temp 98.4 F (36.9 C) (Oral)   Ht 5\' 1"  (1.549 m)   Wt 118 lb (53.5 kg)   BMI 22.30 kg/m    Physical Exam Constitutional:      Appearance: Normal appearance.  HENT:     Head: Normocephalic and atraumatic.     Right Ear: Tympanic membrane, ear canal and external ear normal.     Left Ear: Tympanic membrane, ear canal and external ear normal.  Cardiovascular:     Rate and Rhythm: Normal rate and regular rhythm.     Pulses: Normal pulses.     Heart sounds: Normal heart sounds.  Pulmonary:     Effort: Pulmonary effort is normal.     Breath sounds: Normal breath sounds.  Abdominal:     Tenderness: There is no abdominal tenderness.  Musculoskeletal:     Right knee: Effusion present.     Left knee: Normal.     Right lower leg: Normal. No deformity. No edema.     Left lower leg: Normal. No edema.  Skin:    General: Skin is warm and dry.  Neurological:     Mental Status: She is alert and oriented to person, place, and time. Mental status is at baseline.     Gait: Gait abnormal (slow and cautious).  Psychiatric:        Mood and Affect: Mood normal.        Behavior: Behavior normal.      Most recent functional status assessment: In your present state of health, do you have any difficulty performing the following activities: 11/08/2019  Hearing? N  Vision? Y  Comment -  Difficulty concentrating or making decisions? N  Walking or climbing stairs? Y  Comment -  Dressing or bathing? N  Doing errands, shopping? Y  Preparing Food and eating ? -  Using the Toilet? -  In the past six months, have you accidently leaked urine? -  Do you have problems with loss of bowel control? -  Managing your Medications? -  Managing your Finances? -  Housekeeping or managing your Housekeeping? -  Some recent data might be hidden   Most recent fall risk assessment: Fall Risk  11/08/2019  Falls in the past year? 1  Number falls in past yr: 1  Injury with Fall? 1  Follow up  Falls evaluation completed    Most recent depression screenings: PHQ 2/9 Scores 11/08/2019 11/08/2018  PHQ - 2 Score 0 0  PHQ- 9 Score 3 -   Most recent cognitive screening: 6CIT Screen 11/08/2019  What Year? 0 points  What month? 0 points  What time? 0 points  Count back from 20 0 points  Months in reverse 0 points  Repeat phrase 2 points  Total Score 2   Most recent Audit-C alcohol use screening Alcohol Use Disorder Test (AUDIT) 11/08/2019  1. How often do  you have a drink containing alcohol? 4  2. How many drinks containing alcohol do you have on a typical day when you are drinking? 0  3. How often do you have six or more drinks on one occasion? 0  AUDIT-C Score 4   A score of 3 or more in women, and 4 or more in men indicates increased risk for alcohol abuse, EXCEPT if all of the points are from question 1   No results found for any visits on 11/08/19.  Assessment & Plan     Annual wellness visit done today including the all of the following: Reviewed patient's Family Medical History Reviewed and updated list of patient's medical providers Assessment of cognitive impairment was done Assessed patient's functional ability Established a written schedule for health screening [place] Completed and Reviewed  Exercise Activities and Dietary recommendations Goals    . DIET - INCREASE WATER INTAKE     Recommend increasing water intake to 3 glasses a day.        Immunization History  Administered Date(s) Administered  . Fluad Quad(high Dose 65+) 11/08/2018  . Influenza, High Dose Seasonal PF 10/31/2016, 11/11/2017  . PFIZER SARS-COV-2 Vaccination 03/25/2019, 04/15/2019  . Pneumococcal Conjugate-13 11/22/2014  . Pneumococcal Polysaccharide-23 01/19/2017  . Td 01/19/2017  . Zoster Recombinat (Shingrix) 12/10/2018, 05/13/2019    Health Maintenance  Topic Date Due  . INFLUENZA VACCINE  10/09/2019  . DEXA SCAN  12/04/2019  . TETANUS/TDAP   01/20/2027  . COVID-19 Vaccine  Completed  . PNA vac Low Risk Adult  Completed     Discussed health benefits of physical activity, and encouraged her to engage in regular exercise appropriate for her age and condition.    Problem List Items Addressed This Visit      Cardiovascular and Mediastinum   HTN (hypertension)    Well controlled Continue current medications Recheck metabolic panel F/u in 12 months       Relevant Orders   Comprehensive metabolic panel (Completed)   Lipid panel (Completed)   CBC with Differential/Platelet (Completed)     Digestive   GERD (gastroesophageal reflux disease)    Continue PPI Check CBC        Other   History of breast cancer    Annual mammogram upcoming      Avitaminosis D    Continue supplement Recheck Vit D      Relevant Orders   Vitamin D (25 hydroxy) (Completed)   Insomnia    Chronic Discussed risks of benadryl, ambien, benzos in elderly patients Failed trazodone and melatonin Discussed importance of sleep hygeine      Balance problem    On going issue pt reports having two falls in the last year.  Referral placed to PT for fall prevention and gait training       Relevant Orders   Ambulatory referral to Physical Therapy   Acute pain of right knee    Secondary to a fall about three weeks ago.   Will refer to PT to evaluate and treat.       Relevant Orders   Ambulatory referral to Physical Therapy    Other Visit Diagnoses    Annual physical exam    -  Primary   Relevant Orders   Comprehensive metabolic panel (Completed)   Lipid panel (Completed)   CBC with Differential/Platelet (Completed)       Return in about 1 year (around 11/07/2020) for Annual Wellness .     I,  Brandy Paganini, MD, have reviewed all documentation for this visit. The documentation on 11/09/19 for the exam, diagnosis, procedures, and orders are all accurate and complete.   Myles Tavella, Dionne Bucy, MD, MPH [facility]

## 2019-11-08 NOTE — Assessment & Plan Note (Addendum)
On going issue pt reports having two falls in the last year.  Referral placed to PT for fall prevention and gait training

## 2019-11-08 NOTE — Assessment & Plan Note (Signed)
Secondary to a fall about three weeks ago.   Will refer to PT to evaluate and treat.

## 2019-11-09 ENCOUNTER — Telehealth: Payer: Self-pay

## 2019-11-09 NOTE — Telephone Encounter (Signed)
I haven't seen any paperwork for this.  Her OV note is done if that helps.

## 2019-11-09 NOTE — Assessment & Plan Note (Signed)
Continue PPI Check CBC

## 2019-11-09 NOTE — Assessment & Plan Note (Signed)
Chronic Discussed risks of benadryl, ambien, benzos in elderly patients Failed trazodone and melatonin Discussed importance of sleep hygeine

## 2019-11-09 NOTE — Telephone Encounter (Signed)
Copied from [place] [phone]. Topic: General - Other >> Nov 09, 2019  8:39 AM Rainey Pines A wrote: Patient stated that her insurance in requesting a prior authorization for the physical therapy for her balance that was discussed during her last visit with Dr. Jacinto Reap. Patient is requesting a callback once this has been completed. Please advise

## 2019-11-09 NOTE — Telephone Encounter (Signed)
Per Geraldine Contras Physical therapy will take care of PA.

## 2019-11-09 NOTE — Assessment & Plan Note (Signed)
Continue supplement Recheck Vit D

## 2019-11-09 NOTE — Assessment & Plan Note (Signed)
Annual mammogram upcoming

## 2019-11-10 LAB — CBC WITH DIFFERENTIAL/PLATELET
Basophils Absolute: 0.1 10*3/uL (ref 0.0–0.2)
Basos: 1 %
EOS (ABSOLUTE): 0.2 10*3/uL (ref 0.0–0.4)
Eos: 3 %
Hematocrit: 41.8 % (ref 34.0–46.6)
Hemoglobin: 14.5 g/dL (ref 11.1–15.9)
Immature Grans (Abs): 0 10*3/uL (ref 0.0–0.1)
Immature Granulocytes: 0 %
Lymphocytes Absolute: 2.1 10*3/uL (ref 0.7–3.1)
Lymphs: 33 %
MCH: 32.6 pg (ref 26.6–33.0)
MCHC: 34.7 g/dL (ref 31.5–35.7)
MCV: 94 fL (ref 79–97)
Monocytes Absolute: 0.6 10*3/uL (ref 0.1–0.9)
Monocytes: 9 %
Neutrophils Absolute: 3.4 10*3/uL (ref 1.4–7.0)
Neutrophils: 54 %
Platelets: 388 10*3/uL (ref 150–450)
RBC: 4.45 x10E6/uL (ref 3.77–5.28)
RDW: 12.9 % (ref 11.7–15.4)
WBC: 6.2 10*3/uL (ref 3.4–10.8)

## 2019-11-10 LAB — COMPREHENSIVE METABOLIC PANEL
ALT: 12 IU/L (ref 0–32)
AST: 20 IU/L (ref 0–40)
Albumin/Globulin Ratio: 1.7 (ref 1.2–2.2)
Albumin: 4.8 g/dL — ABNORMAL HIGH (ref 3.6–4.6)
Alkaline Phosphatase: 82 IU/L (ref 48–121)
BUN/Creatinine Ratio: 21 (ref 12–28)
BUN: 13 mg/dL (ref 8–27)
Bilirubin Total: 0.3 mg/dL (ref 0.0–1.2)
CO2: 20 mmol/L (ref 20–29)
Calcium: 10.4 mg/dL — ABNORMAL HIGH (ref 8.7–10.3)
Chloride: 97 mmol/L (ref 96–106)
Creatinine, Ser: 0.61 mg/dL (ref 0.57–1.00)
GFR calc Af Amer: 98 mL/min/{1.73_m2} (ref >59)
GFR calc non Af Amer: 85 mL/min/{1.73_m2} (ref >59)
Globulin, Total: 2.9 g/dL (ref 1.5–4.5)
Glucose: 86 mg/dL (ref 65–99)
Potassium: 4.4 mmol/L (ref 3.5–5.2)
Sodium: 137 mmol/L (ref 134–144)
Total Protein: 7.7 g/dL (ref 6.0–8.5)

## 2019-11-10 LAB — LIPID PANEL
Chol/HDL Ratio: 2.6 ratio (ref 0.0–4.4)
Cholesterol, Total: 273 mg/dL — ABNORMAL HIGH (ref 100–199)
HDL: 105 mg/dL (ref >39)
LDL Chol Calc (NIH): 156 mg/dL — ABNORMAL HIGH (ref 0–99)
Triglycerides: 76 mg/dL (ref 0–149)
VLDL Cholesterol Cal: 12 mg/dL (ref 5–40)

## 2019-11-10 LAB — VITAMIN D 25 HYDROXY (VIT D DEFICIENCY, FRACTURES): Vit D, 25-Hydroxy: 45.8 ng/mL (ref 30.0–100.0)

## 2019-11-10 NOTE — Telephone Encounter (Signed)
Pt is aware of the below information [facility] will take care on PA

## 2019-11-11 ENCOUNTER — Telehealth: Payer: Self-pay

## 2019-11-11 NOTE — Telephone Encounter (Signed)
Patient advised as below. Patient verbalizes understanding and is in agreement with treatment plan.  

## 2019-11-11 NOTE — Telephone Encounter (Signed)
-----   Message from Virginia Crews, MD sent at 11/11/2019 12:29 PM EDT ----- Normal/stable labs.  Cholesterol remains elevated and I recommend diet low in saturated fat and regular exercise - 30 min at least 5 times per week.

## 2019-11-15 NOTE — Progress Notes (Signed)
Subjective:   Brandy Wong is a 85 y.o. female who presents for Medicare Annual (Subsequent) preventive examination.  I connected with Elane Fritz today by telephone and verified that I am speaking with the correct person using two identifiers. Location patient: home Location provider: work Persons participating in the virtual visit: patient, provider.   I discussed the limitations, risks, security and privacy concerns of performing an evaluation and management service by telephone and the availability of in person appointments. I also discussed with the patient that there may be a patient responsible charge related to this service. The patient expressed understanding and verbally consented to this telephonic visit.    Interactive audio and video telecommunications were attempted between this provider and patient, however failed, due to patient having technical difficulties OR patient did not have access to video capability.  We continued and completed visit with audio only.   Review of Systems    N/A  Cardiac Risk Factors include: advanced age (>74men, >78 women);hypertension     Objective:    There were no vitals filed for this visit. There is no height or weight on file to calculate BMI.  Advanced Directives 11/08/2018 11/03/2017 10/31/2016  Does Patient Have a Medical Advance Directive? Yes Yes Yes  Type of Paramedic of [place];Living will [place];Living will [place];Living will  Copy of [place] in Chart? Yes - validated most recent copy scanned in chart (See row information) No - copy requested -    Current Medications (verified) Outpatient Encounter Medications as of 11/16/2019  Medication Sig   calcium carbonate (OSCAL) 1500 (600 Ca) MG TABS tablet Take 600 mg of elemental calcium by mouth 2 (two) times daily with a meal.   Cholecalciferol (VITAMIN D-1000 MAX ST) 1000 units tablet Take  1 tablet by mouth daily.   diphenhydramine-acetaminophen (TYLENOL PM) 25-500 MG TABS tablet Take 1 tablet by mouth at bedtime as needed (every other night).   hydrochlorothiazide (HYDRODIURIL) 25 MG tablet TAKE 1 TABLET BY MOUTH EVERY DAY   MAGNESIUM PO Take 1 capsule by mouth daily.   omeprazole (PRILOSEC) 20 MG capsule TAKE 1 CAPSULE BY MOUTH EVERY DAY   Polyethyl Glycol-Propyl Glycol (SYSTANE OP) Apply 1 drop to eye 4 (four) times daily as needed.   SHINGRIX injection    TURMERIC PO Take 1 tablet by mouth daily.   No facility-administered encounter medications on file as of 11/16/2019.    Allergies (verified) Amoxicillin   History: Past Medical History:  Diagnosis Date   Cancer ([place]) 2013   R breast, s/p radiation and lumpectomy   GERD (gastroesophageal reflux disease)    Hypertension    Past Surgical History:  Procedure Laterality Date   APPENDECTOMY     BREAST SURGERY  2013   lumpectomy   CATARACT EXTRACTION     LAPAROSCOPIC VAGINAL HYSTERECTOMY WITH SALPINGO OOPHORECTOMY     TUBAL LIGATION     Family History  Problem Relation Age of Onset   GER disease Mother    Dementia Mother 14   Stroke Father    Heart attack Father    Social History   Socioeconomic History   Marital status: Widowed    Spouse name: Not on file   Number of children: 2   Years of education: some college   Highest education level: Some college, no degree  Occupational History    Employer: RETIRED  Tobacco Use   Smoking status: Former Smoker  Packs/day: 0.15    Years: 10.00    Pack years: 1.50    Quit date: 03/10/1987    Years since quitting: 32.7   Smokeless tobacco: Never Used  Vaping Use   Vaping Use: Never used  Substance and Sexual Activity   Alcohol use: Yes    Alcohol/week: 7.0 standard drinks    Types: 7 Glasses of wine per week    Comment: 1 glass a day   Drug use: No   Sexual activity: Never  Other Topics Concern   Not on file  Social  History Narrative   Not on file   Social Determinants of Health   Financial Resource Strain: Low Risk    Difficulty of Paying Living Expenses: Not hard at all  Food Insecurity: No Food Insecurity   Worried About Charity fundraiser in the Last Year: Never true   [place] in the Last Year: Never true  Transportation Needs: No Transportation Needs   Lack of Transportation (Medical): No   Lack of Transportation (Non-Medical): No  Physical Activity: Inactive   Days of Exercise per Week: 0 days   Minutes of Exercise per Session: 0 min  Stress: No Stress Concern Present   Feeling of Stress : Not at all  Social Connections: Socially Isolated   Frequency of Communication with Friends and Family: More than three times a week   Frequency of Social Gatherings with Friends and Family: More than three times a week   Attends Religious Services: Never   Marine scientist or Organizations: No   Attends Archivist Meetings: Never   Marital Status: Widowed    Tobacco Counseling Counseling given: Not Answered   Clinical Intake:  Pre-visit preparation completed: Yes  Pain : No/denies pain     Nutritional Risks: None Diabetes: No  How often do you need to have someone help you when you read instructions, pamphlets, or other written materials from your doctor or pharmacy?: 1 - Never  Diabetic? No  Interpreter Needed?: No  Information entered by :: [facility], LPN   Activities of Daily Living In your present state of health, do you have any difficulty performing the following activities: 11/16/2019 11/08/2019  Hearing? N N  Vision? N Y  Difficulty concentrating or making decisions? N N  Walking or climbing stairs? N Y  Dressing or bathing? N N  Doing errands, shopping? N Y  Conservation officer, nature and eating ? N -  Using the Toilet? N -  In the past six months, have you accidently leaked urine? N -  Do you have problems with loss of bowel control? N -    Managing your Medications? N -  Managing your Finances? N -  Housekeeping or managing your Housekeeping? N -  Some recent data might be hidden    Patient Care Team: Virginia Crews, MD as PCP - General (Family Medicine) Dasher, Rayvon Char, MD (Dermatology)  Indicate any recent Medical Services you may have received from other than [facility] providers in the past year (date may be approximate).     Assessment:   This is a routine wellness examination for [place].  Hearing/Vision screen No exam data present  Dietary issues and exercise activities discussed: Current Exercise Habits: Home exercise routine, Type of exercise: yoga, Time (Minutes): 20, Frequency (Times/Week): 7, Weekly Exercise (Minutes/Week): 140, Intensity: Mild, Exercise limited by: orthopedic condition(s)  Goals     DIET - INCREASE WATER INTAKE     Recommend increasing  water intake to 3 glasses a day.      Prevent falls     Recommend to remove any items from the home that may cause slips or trips.      Depression Screen PHQ 2/9 Scores 11/16/2019 11/08/2019 11/08/2018 11/03/2017 11/03/2017 10/31/2016  PHQ - 2 Score 0 0 0 0 0 0  PHQ- 9 Score - 3 - 0 - -    Fall Risk Fall Risk  11/16/2019 11/08/2019 11/08/2018 11/08/2018 11/03/2017  Falls in the past year? 1 1 0 0 No  Number falls in past yr: 1 1 - - -  Injury with Fall? 0 1 - - -  Risk for fall due to : Impaired balance/gait - - - -  Follow up Falls prevention discussed Falls evaluation completed - - -    Any stairs in or around the home? No  If so, are there any without handrails? No  Home free of loose throw rugs in walkways, pet beds, electrical cords, etc? Yes  Adequate lighting in your home to reduce risk of falls? Yes   ASSISTIVE DEVICES UTILIZED TO PREVENT FALLS:  Life alert? No  Use of a cane, walker or w/c? No  Grab bars in the bathroom? No  Shower chair or bench in shower? Yes  Elevated toilet seat or a handicapped toilet? Yes    Cognitive Function:      6CIT Screen 11/08/2019 11/03/2017  What Year? 0 points 0 points  What month? 0 points 0 points  What time? 0 points 0 points  Count back from 20 0 points 0 points  Months in reverse 0 points 0 points  Repeat phrase 2 points 0 points  Total Score 2 0    Immunizations Immunization History  Administered Date(s) Administered   Fluad Quad(high Dose 65+) 11/08/2018   Influenza, High Dose Seasonal PF 10/31/2016, 11/11/2017   PFIZER SARS-COV-2 Vaccination 03/25/2019, 04/15/2019   Pneumococcal Conjugate-13 11/22/2014   Pneumococcal Polysaccharide-23 01/19/2017   Td 01/19/2017   Zoster Recombinat (Shingrix) 12/10/2018, 05/13/2019    TDAP status: Up to date Flu Vaccine status: Will receive at flu clinic tomorrow, 11/17/19. Pneumococcal vaccine status: Up to date Covid-19 vaccine status: Completed vaccines  Qualifies for Shingles Vaccine? Yes   Zostavax completed No   Shingrix Completed?: Yes  Screening Tests Health Maintenance  Topic Date Due   INFLUENZA VACCINE  10/09/2019   DEXA SCAN  12/04/2019   TETANUS/TDAP  01/20/2027   COVID-19 Vaccine  Completed   PNA vac Low Risk Adult  Completed    Health Maintenance  Health Maintenance Due  Topic Date Due   INFLUENZA VACCINE  10/09/2019    Colorectal cancer screening: No longer required.  Mammogram status: No longer required.  Bone Density status: Completed 12/03/17. Results reflect: Bone density results: OSTEOPOROSIS. Repeat every 2 years. Pt declined order at this time.   Lung Cancer Screening: (Low Dose CT Chest recommended if Age 71-80 years, 30 pack-year currently smoking OR have quit w/in 15years.) does not qualify.   Additional Screening:  Vision Screening: Recommended annual ophthalmology exams for early detection of glaucoma and other disorders of the eye. Is the patient up to date with their annual eye exam?  Yes  Who is the provider or what is the name of the office in which the patient attends annual  eye exams? Pt is currently in the process of find and ophthalmologist at this time.  If pt is not established with a provider, would they like to be  referred to a provider to establish care? No .   Dental Screening: Recommended annual dental exams for proper oral hygiene  Community Resource Referral / Chronic Care Management: CRR required this visit?  No   CCM required this visit?  No      Plan:     I have personally reviewed and noted the following in the patients chart:    Medical and social history  Use of alcohol, tobacco or illicit drugs   Current medications and supplements  Functional ability and status  Nutritional status  Physical activity  Advanced directives  List of other physicians  Hospitalizations, surgeries, and ER visits in previous 12 months  Vitals  Screenings to include cognitive, depression, and falls  Referrals and appointments  In addition, I have reviewed and discussed with patient certain preventive protocols, quality metrics, and best practice recommendations. A written personalized care plan for preventive services as well as general preventive health recommendations were provided to patient.     Acheron Sugg [place], [place]   07/16/5636   Nurse Notes: Pt to receive her flu shot at the flu clinic tomorrow. Pt declined a future DEXA scan order at this time.

## 2019-11-16 ENCOUNTER — Other Ambulatory Visit: Payer: Self-pay

## 2019-11-16 ENCOUNTER — Ambulatory Visit (INDEPENDENT_AMBULATORY_CARE_PROVIDER_SITE_OTHER): Payer: Medicare HMO

## 2019-11-16 DIAGNOSIS — Z0000 Encounter for general adult medical examination without abnormal findings: Secondary | ICD-10-CM

## 2019-11-16 NOTE — Patient Instructions (Signed)
Brandy Wong , Thank you for taking time to come for your Medicare Wellness Visit. I appreciate your ongoing commitment to your health goals. Please review the following plan we discussed and let me know if I can assist you in the future.   Screening recommendations/referrals: Colonoscopy: No longer required.  Mammogram: No longer required.  Bone Density: Up to date, due this month. Pt declines order at this time.  Recommended yearly ophthalmology/optometry visit for glaucoma screening and checkup Recommended yearly dental visit for hygiene and checkup  Vaccinations: Influenza vaccine: Due this fall.  Pneumococcal vaccine: Completed series Tdap vaccine: Up to date, due 01/2027 Shingles vaccine: Completed series    Advanced directives: Currently on file.  Conditions/risks identified: Fall risk preventatives discussed today. Recommend to increase water intake to 6-8 8 oz glasses a day.   Next appointment: 11/17/19 @ 1:40 PM with Dr Brita Romp    Preventive Care 83 Years and Older, Female Preventive care refers to lifestyle choices and visits with your health care provider that can promote health and wellness. What does preventive care include?  A yearly physical exam. This is also called an annual well check.  Dental exams once or twice a year.  Routine eye exams. Ask your health care provider how often you should have your eyes checked.  Personal lifestyle choices, including:  Daily care of your teeth and gums.  Regular physical activity.  Eating a healthy diet.  Avoiding tobacco and drug use.  Limiting alcohol use.  Practicing safe sex.  Taking low-dose aspirin every day.  Taking vitamin and mineral supplements as recommended by your health care provider. What happens during an annual well check? The services and screenings done by your health care provider during your annual well check will depend on your age, overall health, lifestyle risk factors, and family history  of disease. Counseling  Your health care provider may ask you questions about your:  Alcohol use.  Tobacco use.  Drug use.  Emotional well-being.  Home and relationship well-being.  Sexual activity.  Eating habits.  History of falls.  Memory and ability to understand (cognition).  Work and work Statistician.  Reproductive health. Screening  You may have the following tests or measurements:  Height, weight, and BMI.  Blood pressure.  Lipid and cholesterol levels. These may be checked every 5 years, or more frequently if you are over 19 years old.  Skin check.  Lung cancer screening. You may have this screening every year starting at age 1 if you have a 30-pack-year history of smoking and currently smoke or have quit within the past 15 years.  Fecal occult blood test (FOBT) of the stool. You may have this test every year starting at age 69.  Flexible sigmoidoscopy or colonoscopy. You may have a sigmoidoscopy every 5 years or a colonoscopy every 10 years starting at age 61.  Hepatitis C blood test.  Hepatitis B blood test.  Sexually transmitted disease (STD) testing.  Diabetes screening. This is done by checking your blood sugar (glucose) after you have not eaten for a while (fasting). You may have this done every 1-3 years.  Bone density scan. This is done to screen for osteoporosis. You may have this done starting at age 48.  Mammogram. This may be done every 1-2 years. Talk to your health care provider about how often you should have regular mammograms. Talk with your health care provider about your test results, treatment options, and if necessary, the need for more tests. Vaccines  Your health care provider may recommend certain vaccines, such as:  Influenza vaccine. This is recommended every year.  Tetanus, diphtheria, and acellular pertussis (Tdap, Td) vaccine. You may need a Td booster every 10 years.  Zoster vaccine. You may need this after age  74.  Pneumococcal 13-valent conjugate (PCV13) vaccine. One dose is recommended after age 19.  Pneumococcal polysaccharide (PPSV23) vaccine. One dose is recommended after age 2. Talk to your health care provider about which screenings and vaccines you need and how often you need them. This information is not intended to replace advice given to you by your health care provider. Make sure you discuss any questions you have with your health care provider. Document Released: 03/23/2015 Document Revised: 11/14/2015 Document Reviewed: 12/26/2014 Elsevier Interactive Patient Education  2017 [employer] Prevention in the Home Falls can cause injuries. They can happen to people of all ages. There are many things you can do to make your home safe and to help prevent falls. What can I do on the outside of my home?  Regularly fix the edges of walkways and driveways and fix any cracks.  Remove anything that might make you trip as you walk through a door, such as a raised step or threshold.  Trim any bushes or trees on the path to your home.  Use bright outdoor lighting.  Clear any walking paths of anything that might make someone trip, such as rocks or tools.  Regularly check to see if handrails are loose or broken. Make sure that both sides of any steps have handrails.  Any raised decks and porches should have guardrails on the edges.  Have any leaves, snow, or ice cleared regularly.  Use sand or salt on walking paths during winter.  Clean up any spills in your garage right away. This includes oil or grease spills. What can I do in the bathroom?  Use night lights.  Install grab bars by the toilet and in the tub and shower. Do not use towel bars as grab bars.  Use non-skid mats or decals in the tub or shower.  If you need to sit down in the shower, use a plastic, non-slip stool.  Keep the floor dry. Clean up any water that spills on the floor as soon as it happens.  Remove  soap buildup in the tub or shower regularly.  Attach bath mats securely with double-sided non-slip rug tape.  Do not have throw rugs and other things on the floor that can make you trip. What can I do in the bedroom?  Use night lights.  Make sure that you have a light by your bed that is easy to reach.  Do not use any sheets or blankets that are too big for your bed. They should not hang down onto the floor.  Have a firm chair that has side arms. You can use this for support while you get dressed.  Do not have throw rugs and other things on the floor that can make you trip. What can I do in the kitchen?  Clean up any spills right away.  Avoid walking on wet floors.  Keep items that you use a lot in easy-to-reach places.  If you need to reach something above you, use a strong step stool that has a grab bar.  Keep electrical cords out of the way.  Do not use floor polish or wax that makes floors slippery. If you must use wax, use non-skid floor wax.  Do  not have throw rugs and other things on the floor that can make you trip. What can I do with my stairs?  Do not leave any items on the stairs.  Make sure that there are handrails on both sides of the stairs and use them. Fix handrails that are broken or loose. Make sure that handrails are as long as the stairways.  Check any carpeting to make sure that it is firmly attached to the stairs. Fix any carpet that is loose or worn.  Avoid having throw rugs at the top or bottom of the stairs. If you do have throw rugs, attach them to the floor with carpet tape.  Make sure that you have a light switch at the top of the stairs and the bottom of the stairs. If you do not have them, ask someone to add them for you. What else can I do to help prevent falls?  Wear shoes that:  Do not have high heels.  Have rubber bottoms.  Are comfortable and fit you well.  Are closed at the toe. Do not wear sandals.  If you use a  stepladder:  Make sure that it is fully opened. Do not climb a closed stepladder.  Make sure that both sides of the stepladder are locked into place.  Ask someone to hold it for you, if possible.  Clearly mark and make sure that you can see:  Any grab bars or handrails.  First and last steps.  Where the edge of each step is.  Use tools that help you move around (mobility aids) if they are needed. These include:  Canes.  Walkers.  Scooters.  Crutches.  Turn on the lights when you go into a dark area. Replace any light bulbs as soon as they burn out.  Set up your furniture so you have a clear path. Avoid moving your furniture around.  If any of your floors are uneven, fix them.  If there are any pets around you, be aware of where they are.  Review your medicines with your doctor. Some medicines can make you feel dizzy. This can increase your chance of falling. Ask your doctor what other things that you can do to help prevent falls. This information is not intended to replace advice given to you by your health care provider. Make sure you discuss any questions you have with your health care provider. Document Released: 12/21/2008 Document Revised: 08/02/2015 Document Reviewed: 03/31/2014 Elsevier Interactive Patient Education  2017 [employer].

## 2019-11-17 ENCOUNTER — Other Ambulatory Visit: Payer: Self-pay

## 2019-11-17 ENCOUNTER — Ambulatory Visit (INDEPENDENT_AMBULATORY_CARE_PROVIDER_SITE_OTHER): Payer: Medicare HMO

## 2019-11-17 DIAGNOSIS — Z23 Encounter for immunization: Secondary | ICD-10-CM

## 2019-11-17 DIAGNOSIS — R2681 Unsteadiness on feet: Secondary | ICD-10-CM | POA: Diagnosis not present

## 2019-11-23 DIAGNOSIS — R2681 Unsteadiness on feet: Secondary | ICD-10-CM | POA: Diagnosis not present

## 2019-11-29 ENCOUNTER — Telehealth: Payer: Self-pay

## 2019-11-29 DIAGNOSIS — Z780 Asymptomatic menopausal state: Secondary | ICD-10-CM

## 2019-11-29 NOTE — Telephone Encounter (Signed)
Copied from [place] [id number]. Topic: General - Other >> Nov 29, 2019  9:52 AM Hinda Lenis D wrote: PT needs to schedule " BONE SCAN " she spoke with Dr on her last appt 11/08/19 about this / please advise

## 2019-11-29 NOTE — Telephone Encounter (Signed)
Okay to order?  Thanks,   -Mickel Baas

## 2019-11-29 NOTE — Telephone Encounter (Signed)
Ok to order 

## 2019-11-30 DIAGNOSIS — R2681 Unsteadiness on feet: Secondary | ICD-10-CM | POA: Diagnosis not present

## 2019-11-30 NOTE — Telephone Encounter (Signed)
Dexa ordered.   Thanks,   -Mickel Baas

## 2019-11-30 NOTE — Telephone Encounter (Signed)
Pt called back in to follow up on bone density order. Advised pt that order has been placed. Pt would like to know how do she go about scheduling? Not showing a location in order.    Please assist.

## 2019-11-30 NOTE — Telephone Encounter (Signed)
Pt advised that the referral coordinator will call with the appointment

## 2019-12-02 DIAGNOSIS — R2681 Unsteadiness on feet: Secondary | ICD-10-CM | POA: Diagnosis not present

## 2019-12-06 DIAGNOSIS — R2681 Unsteadiness on feet: Secondary | ICD-10-CM | POA: Diagnosis not present

## 2019-12-08 DIAGNOSIS — M9903 Segmental and somatic dysfunction of lumbar region: Secondary | ICD-10-CM | POA: Diagnosis not present

## 2019-12-08 DIAGNOSIS — M9902 Segmental and somatic dysfunction of thoracic region: Secondary | ICD-10-CM | POA: Diagnosis not present

## 2019-12-08 DIAGNOSIS — M9901 Segmental and somatic dysfunction of cervical region: Secondary | ICD-10-CM | POA: Diagnosis not present

## 2019-12-08 DIAGNOSIS — M47816 Spondylosis without myelopathy or radiculopathy, lumbar region: Secondary | ICD-10-CM | POA: Diagnosis not present

## 2019-12-09 DIAGNOSIS — R2681 Unsteadiness on feet: Secondary | ICD-10-CM | POA: Diagnosis not present

## 2019-12-13 DIAGNOSIS — R2681 Unsteadiness on feet: Secondary | ICD-10-CM | POA: Diagnosis not present

## 2019-12-16 DIAGNOSIS — R2681 Unsteadiness on feet: Secondary | ICD-10-CM | POA: Diagnosis not present

## 2019-12-21 DIAGNOSIS — R2681 Unsteadiness on feet: Secondary | ICD-10-CM | POA: Diagnosis not present

## 2019-12-22 DIAGNOSIS — Z1231 Encounter for screening mammogram for malignant neoplasm of breast: Secondary | ICD-10-CM | POA: Diagnosis not present

## 2019-12-22 LAB — HM MAMMOGRAPHY

## 2019-12-23 DIAGNOSIS — M9903 Segmental and somatic dysfunction of lumbar region: Secondary | ICD-10-CM | POA: Diagnosis not present

## 2019-12-23 DIAGNOSIS — M47816 Spondylosis without myelopathy or radiculopathy, lumbar region: Secondary | ICD-10-CM | POA: Diagnosis not present

## 2019-12-23 DIAGNOSIS — M9902 Segmental and somatic dysfunction of thoracic region: Secondary | ICD-10-CM | POA: Diagnosis not present

## 2019-12-23 DIAGNOSIS — M9901 Segmental and somatic dysfunction of cervical region: Secondary | ICD-10-CM | POA: Diagnosis not present

## 2019-12-28 DIAGNOSIS — R2681 Unsteadiness on feet: Secondary | ICD-10-CM | POA: Diagnosis not present

## 2020-01-03 ENCOUNTER — Ambulatory Visit
Admission: RE | Admit: 2020-01-03 | Discharge: 2020-01-03 | Disposition: A | Discharge status: Home / Self Care | Payer: Medicare HMO | Source: Ambulatory Visit | Attending: Family Medicine | Admitting: Family Medicine

## 2020-01-03 ENCOUNTER — Other Ambulatory Visit: Payer: Self-pay

## 2020-01-03 DIAGNOSIS — R29890 Loss of height: Secondary | ICD-10-CM | POA: Diagnosis not present

## 2020-01-03 DIAGNOSIS — Z853 Personal history of malignant neoplasm of breast: Secondary | ICD-10-CM | POA: Diagnosis not present

## 2020-01-03 DIAGNOSIS — Z780 Asymptomatic menopausal state: Secondary | ICD-10-CM | POA: Insufficient documentation

## 2020-01-03 DIAGNOSIS — Z8739 Personal history of other diseases of the musculoskeletal system and connective tissue: Secondary | ICD-10-CM | POA: Diagnosis not present

## 2020-01-03 DIAGNOSIS — Z90710 Acquired absence of both cervix and uterus: Secondary | ICD-10-CM | POA: Diagnosis not present

## 2020-01-04 DIAGNOSIS — R2681 Unsteadiness on feet: Secondary | ICD-10-CM | POA: Diagnosis not present

## 2020-01-06 DIAGNOSIS — R2681 Unsteadiness on feet: Secondary | ICD-10-CM | POA: Diagnosis not present

## 2020-01-11 DIAGNOSIS — M9902 Segmental and somatic dysfunction of thoracic region: Secondary | ICD-10-CM | POA: Diagnosis not present

## 2020-01-11 DIAGNOSIS — R2681 Unsteadiness on feet: Secondary | ICD-10-CM | POA: Diagnosis not present

## 2020-01-11 DIAGNOSIS — M9901 Segmental and somatic dysfunction of cervical region: Secondary | ICD-10-CM | POA: Diagnosis not present

## 2020-01-11 DIAGNOSIS — M47816 Spondylosis without myelopathy or radiculopathy, lumbar region: Secondary | ICD-10-CM | POA: Diagnosis not present

## 2020-01-11 DIAGNOSIS — M9903 Segmental and somatic dysfunction of lumbar region: Secondary | ICD-10-CM | POA: Diagnosis not present

## 2020-01-31 ENCOUNTER — Other Ambulatory Visit: Payer: Self-pay | Admitting: Family Medicine

## 2020-01-31 DIAGNOSIS — I10 Essential (primary) hypertension: Secondary | ICD-10-CM

## 2020-02-01 ENCOUNTER — Ambulatory Visit: Payer: Self-pay | Admitting: *Deleted

## 2020-02-01 NOTE — Telephone Encounter (Signed)
Left a message for patient at cell phone to go to urgent care.

## 2020-02-01 NOTE — Telephone Encounter (Signed)
Pt called in c/o urinary symptoms of pressure over her bladder area and going to the bathroom every 2 hours even during the night for the last 3-4 days.   Her last UTI was over 30 years ago.   "I think I have a UTI but this is not the usual symptoms".  She will be out of town today from 11:30 until after 5:00 this evening.   She is requesting "Something to hold her over until her appt on Monday".   I let her know we would need a urine sample but that I would send Dr. Brita Romp a note with her request.   "I can't do a urine sample today".  Her cell # [phone]  I sent a note to Dr. Bridgette Habermann with pt's request.  If she calls something in [facility] [address]    Reason for Disposition  Urinating more frequently than usual (i.e., frequency)  Answer Assessment - Initial Assessment Questions 1. SYMPTOM: "What's the main symptom you're concerned about?" (e.g., frequency, incontinence)     For last 3-4 days I thought I was getting a UTI.  I've been drinking cranberry juice.   No blood in urine.   Going to bathroom every 2 hours and it's urgent even during the night.  No burning.  Having pressure and going to the bathroom often.    I'm leaving at 11:30 today and can't have an appt today.   I won't be back until late this afternoon about 5:00.    Can she just call me in something to hold me over until my appt on Monday?   (Office is closed next 2 days for Thanksgiving). 2. ONSET: "When did the  3 days ago  start?"     Urinating frequently and pressure over my bladder started 3-4 days ago.  I've not had a UTI for over 30 years 3. PAIN: "Is there any pain?" If Yes, ask: "How bad is it?" (Scale: 1-10; mild, moderate, severe)     Denies pain or burning only pressure over her bladder area. 4. CAUSE: "What do you think is causing the symptoms?"     UTI 5. OTHER SYMPTOMS: "Do you have any other symptoms?" (e.g., fever, flank pain, blood in urine, pain with urination)      Urgency and going to the bathroom every 2 hours even during the night. 6. PREGNANCY: "Is there any chance you are pregnant?" "When was your last menstrual period?"     N/A  Protocols used: URINARY [facility]

## 2020-02-06 ENCOUNTER — Encounter: Payer: Self-pay | Admitting: Family Medicine

## 2020-02-06 ENCOUNTER — Ambulatory Visit (INDEPENDENT_AMBULATORY_CARE_PROVIDER_SITE_OTHER): Payer: Medicare HMO | Admitting: Family Medicine

## 2020-02-06 ENCOUNTER — Other Ambulatory Visit: Payer: Self-pay

## 2020-02-06 VITALS — BP 140/82 | HR 88 | Temp 98.9°F | Resp 16 | Wt 122.0 lb

## 2020-02-06 DIAGNOSIS — N3091 Cystitis, unspecified with hematuria: Secondary | ICD-10-CM

## 2020-02-06 LAB — POCT URINALYSIS DIPSTICK
Bilirubin, UA: NEGATIVE
Blood, UA: POSITIVE
Glucose, UA: NEGATIVE
Ketones, UA: NEGATIVE
Nitrite, UA: POSITIVE
Protein, UA: NEGATIVE
Spec Grav, UA: 1.01 (ref 1.010–1.025)
Urobilinogen, UA: 0.2 E.U./dL
pH, UA: 7 (ref 5.0–8.0)

## 2020-02-06 MED ORDER — CEPHALEXIN 500 MG PO CAPS: 500.0000 mg | ORAL_CAPSULE | Freq: Three times a day (TID) | ORAL | 0 refills | Status: AC

## 2020-02-06 NOTE — Patient Instructions (Signed)
Urinary Tract Infection, Adult A urinary tract infection (UTI) is an infection of any part of the urinary tract. The urinary tract includes:  The kidneys.  The ureters.  The bladder.  The urethra. These organs make, store, and get rid of pee (urine) in the body. What are the causes? This is caused by germs (bacteria) in your genital area. These germs grow and cause swelling (inflammation) of your urinary tract. What increases the risk? You are more likely to develop this condition if:  You have a small, thin tube (catheter) to drain pee.  You cannot control when you pee or poop (incontinence).  You are female, and: ? You use these methods to prevent pregnancy:  A medicine that kills sperm (spermicide).  A device that blocks sperm (diaphragm). ? You have low levels of a female hormone (estrogen). ? You are pregnant.  You have genes that add to your risk.  You are sexually active.  You take antibiotic medicines.  You have trouble peeing because of: ? A prostate that is bigger than normal, if you are female. ? A blockage in the part of your body that drains pee from the bladder (urethra). ? A kidney stone. ? A nerve condition that affects your bladder (neurogenic bladder). ? Not getting enough to drink. ? Not peeing often enough.  You have other conditions, such as: ? Diabetes. ? A weak disease-fighting system (immune system). ? Sickle cell disease. ? Gout. ? Injury of the spine. What are the signs or symptoms? Symptoms of this condition include:  Needing to pee right away (urgently).  Peeing often.  Peeing small amounts often.  Pain or burning when peeing.  Blood in the pee.  Pee that smells bad or not like normal.  Trouble peeing.  Pee that is cloudy.  Fluid coming from the vagina, if you are female.  Pain in the belly or lower back. Other symptoms include:  Throwing up (vomiting).  No urge to eat.  Feeling mixed up (confused).  Being tired  and grouchy (irritable).  A fever.  Watery poop (diarrhea). How is this treated? This condition may be treated with:  Antibiotic medicine.  Other medicines.  Drinking enough water. Follow these instructions at home:  Medicines  Take over-the-counter and prescription medicines only as told by your doctor.  If you were prescribed an antibiotic medicine, take it as told by your doctor. Do not stop taking it even if you start to feel better. General instructions  Make sure you: ? Pee until your bladder is empty. ? Do not hold pee for a long time. ? Empty your bladder after sex. ? Wipe from front to back after pooping if you are a female. Use each tissue one time when you wipe.  Drink enough fluid to keep your pee pale yellow.  Keep all follow-up visits as told by your doctor. This is important. Contact a doctor if:  You do not get better after 1-2 days.  Your symptoms go away and then come back. Get help right away if:  You have very bad back pain.  You have very bad pain in your lower belly.  You have a fever.  You are sick to your stomach (nauseous).  You are throwing up. Summary  A urinary tract infection (UTI) is an infection of any part of the urinary tract.  This condition is caused by germs in your genital area.  There are many risk factors for a UTI. These include having a small, thin   tube to drain pee and not being able to control when you pee or poop.  Treatment includes antibiotic medicines for germs.  Drink enough fluid to keep your pee pale yellow. This information is not intended to replace advice given to you by your health care provider. Make sure you discuss any questions you have with your health care provider. Document Revised: 02/11/2018 Document Reviewed: 09/03/2017 Elsevier Patient Education  2020 Elsevier Inc[employer]  

## 2020-02-06 NOTE — Progress Notes (Signed)
Established patient visit   Patient: Brandy Wong   DOB: 08-15-1934   85 y.o. Female  MRN: [id number] Visit Date: 02/06/2020  Today's healthcare provider: Lavon Paganini, MD   Chief Complaint  Patient presents with  . Urinary Tract Infection   Subjective    HPI HPI    Urinary Tract Infection    This is a new problem.  Recent episode started in the past 7 days.  The problem has been waxing and waning since onset.  Severity of the pain is none.  The patient  has not been recently treated for similar symptoms.  Abdominal Pain: Present (pressure).  Back Pain: Absent.  Chills: Absent.  Cloudy malodorus urine: Absent.  Constipation: Absent.  Cramping: Absent.  Diarrhea: Absent.  Discharge: Absent.  Fever: Absent.  Hematuria: Absent.  Nausea: Absent.  Vomiting: Absent.       Last edited by Dorian Pod, [place] on 02/06/2020  4:35 PM. (History)        Patient Active Problem List   Diagnosis Date Noted  . Acute pain of right knee 11/08/2019  . Insomnia 11/08/2018  . Balance problem 11/08/2018  . GERD (gastroesophageal reflux disease) 10/31/2016  . HTN (hypertension) 10/31/2016  . Avitaminosis D 10/31/2016  . History of uterine prolapse 05/19/2013  . History of breast cancer 11/05/2012   Social History   Tobacco Use  . Smoking status: Former Smoker    Packs/day: 0.15    Years: 10.00    Pack years: 1.50    Quit date: 03/10/1987    Years since quitting: 32.9  . Smokeless tobacco: Never Used  Vaping Use  . Vaping Use: Never used  Substance Use Topics  . Alcohol use: Yes    Alcohol/week: 7.0 standard drinks    Types: 7 Glasses of wine per week    Comment: 1 glass a day  . Drug use: No   Allergies  Allergen Reactions  . Amoxicillin Rash       Medications: Outpatient Medications Prior to Visit  Medication Sig  . calcium carbonate (OSCAL) 1500 (600 Ca) MG TABS tablet Take 600 mg of elemental calcium by mouth 2 (two) times daily with a meal.  .  Cholecalciferol (VITAMIN D-1000 MAX ST) 1000 units tablet Take 1 tablet by mouth daily.  . Cranberry 125 MG TABS Take 1 tablet by mouth daily.  . diphenhydramine-acetaminophen (TYLENOL PM) 25-500 MG TABS tablet Take 1 tablet by mouth at bedtime as needed (every other night).  . hydrochlorothiazide (HYDRODIURIL) 25 MG tablet TAKE 1 TABLET BY MOUTH EVERY DAY  . MAGNESIUM PO Take 1 capsule by mouth daily.  Marland Kitchen omeprazole (PRILOSEC) 20 MG capsule TAKE 1 CAPSULE BY MOUTH EVERY DAY  . Polyethyl Glycol-Propyl Glycol (SYSTANE OP) Apply 1 drop to eye 4 (four) times daily as needed.  Marland Kitchen SHINGRIX injection   . TURMERIC PO Take 1 tablet by mouth daily.   No facility-administered medications prior to visit.    Review of Systems  Constitutional: Negative for chills and fever.  Respiratory: Negative for cough and shortness of breath.   Cardiovascular: Negative for chest pain and palpitations.  Genitourinary: Positive for frequency and pelvic pain.    Last CBC Lab Results  Component Value Date   WBC 6.2 11/08/2019   HGB 14.5 11/08/2019   HCT 41.8 11/08/2019   MCV 94 11/08/2019   MCH 32.6 11/08/2019   RDW 12.9 11/08/2019   PLT 388 11/08/2019      Objective  BP 140/82 (BP Location: Right Arm, Patient Position: Sitting, Cuff Size: Normal)   Pulse 88   Temp 98.9 F (37.2 C) (Oral)   Resp 16   Wt 122 lb (55.3 kg)   SpO2 99%   BMI 23.05 kg/m  BP Readings from Last 3 Encounters:  02/06/20 140/82  11/08/19 (!) 142/80  11/08/18 118/75   Wt Readings from Last 3 Encounters:  02/06/20 122 lb (55.3 kg)  11/08/19 118 lb (53.5 kg)  11/08/18 114 lb (51.7 kg)      Physical Exam Vitals reviewed.  Constitutional:      General: She is not in acute distress.    Appearance: She is well-developed.  HENT:     Head: Normocephalic and atraumatic.  Eyes:     General: No scleral icterus.    Conjunctiva/sclera: Conjunctivae normal.  Cardiovascular:     Rate and Rhythm: Normal rate and regular  rhythm.     Heart sounds: Normal heart sounds. No murmur heard.   Pulmonary:     Effort: Pulmonary effort is normal. No respiratory distress.     Breath sounds: Normal breath sounds. No wheezing or rales.  Abdominal:     General: There is no distension.     Palpations: Abdomen is soft.     Tenderness: There is abdominal tenderness in the suprapubic area. There is no guarding or rebound.  Skin:    General: Skin is warm and dry.     Capillary Refill: Capillary refill takes less than 2 seconds.     Findings: No rash.  Neurological:     Mental Status: She is alert and oriented to person, place, and time.  Psychiatric:        Behavior: Behavior normal.       Results for orders placed or performed in visit on 02/06/20  POCT urinalysis dipstick  Result Value Ref Range   Color, UA yellow    Clarity, UA clear    Glucose, UA Negative Negative   Bilirubin, UA Negative    Ketones, UA Negative    Spec Grav, UA 1.010 1.010 - 1.025   Blood, UA Positive    pH, UA 7.0 5.0 - 8.0   Protein, UA Negative Negative   Urobilinogen, UA 0.2 0.2 or 1.0 E.U./dL   Nitrite, UA Positive    Leukocytes, UA Large (3+) (A) Negative    Assessment & Plan     1. Cystitis with hematuria - Symptoms and UA consistent with UTI -No systemic symptoms or signs of pyelonephritis - Given hematuria, will send urine micro to confirm and will plan to recheck urine in about 6 weeks after completion of antibiotics to ensure hematuria has cleared -Will start treatment with 5day course of Keflex -We will send urine culture to confirm sensitivities -Discussed return precautions  - POCT urinalysis dipstick - Urine Culture - Urine Microscopic    Return if symptoms worsen or fail to improve.       I, Lavon Paganini, MD, have reviewed all documentation for this visit. The documentation on 02/06/20 for the exam, diagnosis, procedures, and orders are all accurate and complete.   Tahtiana Rozier, Dionne Bucy, MD,  [facility]

## 2020-02-08 LAB — URINALYSIS, MICROSCOPIC ONLY

## 2020-02-09 ENCOUNTER — Telehealth: Payer: Self-pay

## 2020-02-09 LAB — URINE CULTURE

## 2020-02-09 NOTE — Telephone Encounter (Signed)
Pt. Given lab results, states she is feeling better.

## 2020-02-13 ENCOUNTER — Telehealth: Payer: Self-pay

## 2020-02-13 NOTE — Telephone Encounter (Signed)
Copied from [place] [id number]. Topic: General - Other >> Feb 13, 2020  9:00 AM Leward Quan A wrote: Reason for CRM: Patient would like a call back to schedule for a Covid booster. Please call  Ph# [phone]

## 2020-02-13 NOTE — Telephone Encounter (Signed)
Please schedule covid booster. Thanks!

## 2020-02-29 ENCOUNTER — Ambulatory Visit (INDEPENDENT_AMBULATORY_CARE_PROVIDER_SITE_OTHER): Payer: Medicare HMO

## 2020-02-29 ENCOUNTER — Other Ambulatory Visit: Payer: Self-pay

## 2020-02-29 DIAGNOSIS — Z23 Encounter for immunization: Secondary | ICD-10-CM

## 2020-03-20 DIAGNOSIS — X32XXXA Exposure to sunlight, initial encounter: Secondary | ICD-10-CM | POA: Diagnosis not present

## 2020-03-20 DIAGNOSIS — L570 Actinic keratosis: Secondary | ICD-10-CM | POA: Diagnosis not present

## 2020-03-20 DIAGNOSIS — L821 Other seborrheic keratosis: Secondary | ICD-10-CM | POA: Diagnosis not present

## 2020-03-20 DIAGNOSIS — H61032 Chondritis of left external ear: Secondary | ICD-10-CM | POA: Diagnosis not present

## 2020-04-03 DIAGNOSIS — H6121 Impacted cerumen, right ear: Secondary | ICD-10-CM | POA: Diagnosis not present

## 2020-04-03 DIAGNOSIS — H6063 Unspecified chronic otitis externa, bilateral: Secondary | ICD-10-CM | POA: Diagnosis not present

## 2020-04-26 ENCOUNTER — Other Ambulatory Visit: Payer: Self-pay | Admitting: Family Medicine

## 2020-04-26 DIAGNOSIS — I10 Essential (primary) hypertension: Secondary | ICD-10-CM

## 2020-04-26 NOTE — Telephone Encounter (Signed)
Requested medications are due for refill today yes  Requested medications are on the active medication list yes  Last refill 11/23  Last visit 11/08/19  Future visit scheduled 11/15/20  Notes to clinic Failed protocol due to no valid visit within 6  months.

## 2020-05-17 DIAGNOSIS — D485 Neoplasm of uncertain behavior of skin: Secondary | ICD-10-CM | POA: Diagnosis not present

## 2020-05-17 DIAGNOSIS — R208 Other disturbances of skin sensation: Secondary | ICD-10-CM | POA: Diagnosis not present

## 2020-05-17 DIAGNOSIS — L570 Actinic keratosis: Secondary | ICD-10-CM | POA: Diagnosis not present

## 2020-05-22 ENCOUNTER — Other Ambulatory Visit: Payer: Self-pay | Admitting: Family Medicine

## 2020-05-22 DIAGNOSIS — K219 Gastro-esophageal reflux disease without esophagitis: Secondary | ICD-10-CM

## 2020-06-14 DIAGNOSIS — H52223 Regular astigmatism, bilateral: Secondary | ICD-10-CM | POA: Diagnosis not present

## 2020-06-14 DIAGNOSIS — I10 Essential (primary) hypertension: Secondary | ICD-10-CM | POA: Diagnosis not present

## 2020-07-09 ENCOUNTER — Telehealth: Payer: Self-pay

## 2020-07-09 MED ORDER — NORTRIPTYLINE HCL 10 MG PO CAPS: 10.0000 mg | ORAL_CAPSULE | Freq: Every day | ORAL | 0 refills | Status: DC

## 2020-07-09 NOTE — Telephone Encounter (Signed)
I prefer nortriptyline to amitriptyline in people over 65 as it has less side effects.  If she would like to try this, I am amenable to trying 10 mg nightly.  We can send in a prescription for 15 pills for her to try it out.

## 2020-07-09 NOTE — Telephone Encounter (Signed)
Copied from [phone]. Topic: General - Other >> Jul 09, 2020  9:07 AM Keene Breath wrote: Reason for CRM: Patient would like the nurse to call regarding a sleep medication.  Patient would like to ask the nurse or doctor if they could prescribe a medication for sleep which she said her friend recommends, who is a Marine scientist.  The medication is Amitriptyline.  Please advise and call patient to discuss at [phone]

## 2020-07-09 NOTE — Telephone Encounter (Signed)
Left detailed message at patients request and sent prescription into [facility]. KW

## 2020-07-09 NOTE — Telephone Encounter (Signed)
Spoke with patient on phone to get clearer information as to her symptoms and how long she has been dealing with questionable insomnia. Patient states " I have had sleep problems for a while that she knows about, every sleep aide she has given has had the opposite affect on me. She has mentioned to me Ambien but I am concerned about possible side effects. My good friend is a Marine scientist and she is taking Amitriptyline for sleep and told me it helps with symptoms of depression, which I do not have.She gave me the pill the other night to see if it works and it did, I was wondering if Dr. B could prescribe the same? Just enough to try and see if effective, maybe for one or two weeks but not a full prescription." Please review chart and advise, [facility] uses [facility] [address], lov was 11/29/. KW

## 2020-07-17 ENCOUNTER — Other Ambulatory Visit: Payer: Self-pay | Admitting: Family Medicine

## 2020-07-17 NOTE — Telephone Encounter (Signed)
  Notes to clinic:  Patient requesting a 90 day supply    Requested Prescriptions  Pending Prescriptions Disp Refills   nortriptyline (PAMELOR) 10 MG capsule [Pharmacy Med Name: NORTRIPTYLINE HCL 10 MG CAP] 90 capsule 1    Sig: TAKE 1 CAPSULE BY MOUTH AT BEDTIME.      Psychiatry:  Antidepressants - Heterocyclics (TCAs) Passed - 07/17/2020  8:31 AM      Passed - Valid encounter within last 6 months    Recent Outpatient Visits           5 months ago Cystitis with hematuria   [facility] [place], Dionne Bucy, MD   8 months ago Annual physical exam   [facility] [place], Dionne Bucy, MD   1 year ago Encounter for annual physical exam   [facility] [place], Dionne Bucy, MD   2 years ago Encounter for annual physical exam   [facility] [place], Dionne Bucy, MD   3 years ago Left arm pain   [facility] [place], Dionne Bucy, MD

## 2020-07-19 ENCOUNTER — Ambulatory Visit: Payer: Self-pay | Admitting: *Deleted

## 2020-07-19 MED ORDER — NORTRIPTYLINE HCL 10 MG PO CAPS: 30.0000 mg | ORAL_CAPSULE | Freq: Every day | ORAL | 1 refills | Status: DC

## 2020-07-19 NOTE — Telephone Encounter (Signed)
Patient advised she wants [facility] [address] since since only has 10mg  tablet. Number of quantity and number of refills? KW

## 2020-07-19 NOTE — Addendum Note (Signed)
Addended by: Virginia Crews on: 07/19/2020 03:38 PM   Modules accepted: Orders

## 2020-07-19 NOTE — Telephone Encounter (Signed)
Rx sent. They do not make a 30 mg pill, so it is still 3 of the 10s.

## 2020-07-19 NOTE — Telephone Encounter (Signed)
Patient advised.KW 

## 2020-07-19 NOTE — Telephone Encounter (Signed)
Per initial encounter, "Pt was advised to call and request to speak to triage if she had questions about her medication, nortriptyline (PAMELOR) 10 MG capsule"; contacted pt to discuss; the pt says when she take 1nortyiptyline tablet it does no good; she takes 2 tablets but she only gets 4 hrs sleep; the pt would like discuss increasing her dose of medication or changing her to a stronger the pt can be contacted at [phone]; will route to office for provider review.  Reason for Disposition . [1] Caller has URGENT medicine question about med that PCP or specialist prescribed AND [2] triager unable to answer question  Answer Assessment - Initial Assessment Questions 1. NAME of MEDICATION: "What medicine are you calling about?"     nortriptyline 2. QUESTION: "What is your question?" (e.g., double dose of medicine, side effect)   Can she changed to a higher dose of med or a stronger med? 3. PRESCRIBING HCP: "Who prescribed it?" Reason: if prescribed by specialist, call should be referred to that group.   Cr [place]igalupo 4. SYMPTOMS: "Do you have any symptoms?"    5. SEVERITY: If symptoms are present, ask "Are they mild, moderate or severe?"     6. PREGNANCY:  "Is there any chance that you are pregnant?" "When was your last menstrual period?"   no  Protocols used: MEDICATION QUESTION CALL-A-AH

## 2020-07-19 NOTE — Telephone Encounter (Signed)
Ok to increase Nortriptyline to 30 mg daily.

## 2020-07-24 DIAGNOSIS — R42 Dizziness and giddiness: Secondary | ICD-10-CM | POA: Diagnosis not present

## 2020-07-24 DIAGNOSIS — H9201 Otalgia, right ear: Secondary | ICD-10-CM | POA: Diagnosis not present

## 2020-07-24 DIAGNOSIS — H903 Sensorineural hearing loss, bilateral: Secondary | ICD-10-CM | POA: Diagnosis not present

## 2020-08-06 ENCOUNTER — Other Ambulatory Visit: Payer: Self-pay | Admitting: Family Medicine

## 2020-08-06 DIAGNOSIS — I10 Essential (primary) hypertension: Secondary | ICD-10-CM

## 2020-08-07 NOTE — Telephone Encounter (Signed)
}    Notes to clinic: Patient has follow up appt on 11/15/2020 Review for enough medication until that time  Patient is due for 6 month follow up and labs    Requested Prescriptions  Pending Prescriptions Disp Refills   hydrochlorothiazide (HYDRODIURIL) 25 MG tablet [Pharmacy Med Name: HYDROCHLOROTHIAZIDE 25 MG TAB] 90 tablet 0    Sig: TAKE 1 TABLET BY MOUTH EVERY DAY      Cardiovascular: Diuretics - Thiazide Failed - 08/06/2020  8:40 AM      Failed - Ca in normal range and within 360 days    Calcium  Date Value Ref Range Status  11/08/2019 10.4 (H) 8.7 - 10.3 mg/dL Final          Failed - Last BP in normal range    BP Readings from Last 1 Encounters:  02/06/20 140/82          Failed - Valid encounter within last 6 months    Recent Outpatient Visits           6 months ago Cystitis with hematuria   [facility] [place], Dionne Bucy, MD   9 months ago Annual physical exam   [facility] [place], Dionne Bucy, MD   1 year ago Encounter for annual physical exam   [facility] [place], Dionne Bucy, MD   2 years ago Encounter for annual physical exam   [facility] [place], Dionne Bucy, MD   3 years ago Left arm pain   [facility] [place], Dionne Bucy, MD                Passed - Cr in normal range and within 360 days    Creatinine, Ser  Date Value Ref Range Status  11/08/2019 0.61 0.57 - 1.00 mg/dL Final          Passed - K in normal range and within 360 days    Potassium  Date Value Ref Range Status  11/08/2019 4.4 3.5 - 5.2 mmol/L Final          Passed - Na in normal range and within 360 days    Sodium  Date Value Ref Range Status  11/08/2019 137 134 - 144 mmol/L Final

## 2020-08-08 ENCOUNTER — Telehealth: Payer: Self-pay

## 2020-08-08 NOTE — Telephone Encounter (Signed)
Copied from [place] [id number]. Topic: General - Inquiry >> Aug 08, 2020  9:58 AM Valere Dross wrote: Patient wants to talk to a nurse about perscription, wants to try something different, regarding old medication for sleep.

## 2020-08-09 NOTE — Telephone Encounter (Signed)
Ok. Would need more information. May need visit to discuss other sleeping med options

## 2020-08-09 NOTE — Telephone Encounter (Signed)
lmtcb

## 2020-08-10 ENCOUNTER — Other Ambulatory Visit: Payer: Self-pay | Admitting: Family Medicine

## 2020-08-10 NOTE — Telephone Encounter (Signed)
Cell phone number added. [phone]

## 2020-08-10 NOTE — Telephone Encounter (Signed)
Requested medication (s) are due for refill today: yes  Requested medication (s) are on the active medication list: yes  Last refill:  07/19/20 #90 1 refills  Future visit scheduled: 11/15/20 medicare AWV, sequential  Notes to clinic:   Pharmacy comment: REQUEST FOR 90 DAYS PRESCRIPTION.    Psychiatry:  Antidepressants - Heterocyclics (TCAs) Failed          Requested Prescriptions  Pending Prescriptions Disp Refills   nortriptyline (PAMELOR) 10 MG capsule [Pharmacy Med Name: NORTRIPTYLINE HCL 10 MG CAP] 270 capsule 1    Sig: Take 3 capsules (30 mg total) by mouth at bedtime.      Psychiatry:  Antidepressants - Heterocyclics (TCAs) Failed - 08/10/2020  1:30 PM      Failed - Valid encounter within last 6 months    Recent Outpatient Visits           6 months ago Cystitis with hematuria   [facility] [place], Dionne Bucy, MD   9 months ago Annual physical exam   [facility] [place], Dionne Bucy, MD   1 year ago Encounter for annual physical exam   [facility] [place], Dionne Bucy, MD   2 years ago Encounter for annual physical exam   [facility] [place], Dionne Bucy, MD   3 years ago Left arm pain   [facility] [place], Dionne Bucy, MD

## 2020-08-10 NOTE — Telephone Encounter (Signed)
Patient called in returning a call. Pt stated a vm was left but she was unsure on what she was needed to do, as far as making an appt. I did attempt to call office and was unable to reach anyone. Pt request a call back, and if she does not pick up a detailed vm. Please advise.

## 2020-08-15 DIAGNOSIS — M67813 Other specified disorders of tendon, right shoulder: Secondary | ICD-10-CM | POA: Diagnosis not present

## 2020-08-17 ENCOUNTER — Telehealth: Payer: Medicare HMO | Admitting: Family Medicine

## 2020-09-19 ENCOUNTER — Telehealth: Payer: Self-pay

## 2020-09-19 DIAGNOSIS — G4700 Insomnia, unspecified: Secondary | ICD-10-CM

## 2020-09-19 NOTE — Telephone Encounter (Signed)
Called patient to see what question she had and on which medicine.  Per patient she went to see the orthopedics June 08 for neck and shoulder pain and was prescribed cyclobenzaprine 5mg  to take 1 tablet by mouth at night. She reports that this is helping her more with her sleep. She states having some cramping in her ankles and feet sometimes but wants to know if Dr.B can take over this medicine to take everyday? Reports that she called the Orthopedic office and was advised to contact he PCP.

## 2020-09-19 NOTE — Telephone Encounter (Signed)
Copied from [place] [id number]. Topic: General - Other >> Sep 19, 2020 10:06 AM Leward Quan A wrote: Reason for CRM: Patient called in asking to speak to Dr B nurse has some questions about an Rx. Please call ph# [phone]

## 2020-09-20 MED ORDER — CYCLOBENZAPRINE HCL 5 MG PO TABS: 5.0000 mg | ORAL_TABLET | Freq: Every day | ORAL | 1 refills | Status: DC

## 2020-09-20 NOTE — Telephone Encounter (Signed)
Advised patient of your message and told her I would send prescription with 1 refill, patient states that is not enough to last her till her appt with you on 11/15/20, patient wants to know why do you feel that she is at high risk for medication since she has been on medication already in past? And she would like to know what are the risk factors? KW

## 2020-09-20 NOTE — Telephone Encounter (Signed)
Again, we can discuss at her next visit.  You can send in enough to get her to that appt. It is meant as a short term medication, not long term. In fact, many insurances will not cover it long term in folks >65.

## 2020-11-02 ENCOUNTER — Other Ambulatory Visit: Payer: Self-pay | Admitting: Family Medicine

## 2020-11-02 DIAGNOSIS — I10 Essential (primary) hypertension: Secondary | ICD-10-CM

## 2020-11-15 ENCOUNTER — Encounter: Payer: Self-pay | Admitting: Family Medicine

## 2020-11-15 ENCOUNTER — Ambulatory Visit (INDEPENDENT_AMBULATORY_CARE_PROVIDER_SITE_OTHER): Payer: Medicare HMO | Admitting: Family Medicine

## 2020-11-15 ENCOUNTER — Other Ambulatory Visit: Payer: Self-pay

## 2020-11-15 VITALS — BP 151/59 | HR 72 | Temp 98.0°F | Ht 61.0 in | Wt 119.0 lb

## 2020-11-15 DIAGNOSIS — Z0000 Encounter for general adult medical examination without abnormal findings: Secondary | ICD-10-CM | POA: Diagnosis not present

## 2020-11-15 DIAGNOSIS — Z23 Encounter for immunization: Secondary | ICD-10-CM

## 2020-11-15 DIAGNOSIS — I10 Essential (primary) hypertension: Secondary | ICD-10-CM | POA: Diagnosis not present

## 2020-11-15 DIAGNOSIS — M71349 Other bursal cyst, unspecified hand: Secondary | ICD-10-CM

## 2020-11-15 DIAGNOSIS — G4700 Insomnia, unspecified: Secondary | ICD-10-CM | POA: Diagnosis not present

## 2020-11-15 DIAGNOSIS — S81809A Unspecified open wound, unspecified lower leg, initial encounter: Secondary | ICD-10-CM | POA: Insufficient documentation

## 2020-11-15 DIAGNOSIS — S81802A Unspecified open wound, left lower leg, initial encounter: Secondary | ICD-10-CM

## 2020-11-15 DIAGNOSIS — K219 Gastro-esophageal reflux disease without esophagitis: Secondary | ICD-10-CM

## 2020-11-15 DIAGNOSIS — R053 Chronic cough: Secondary | ICD-10-CM | POA: Insufficient documentation

## 2020-11-15 MED ORDER — DOXEPIN HCL 3 MG PO TABS: 3.0000 mg | ORAL_TABLET | Freq: Every evening | ORAL | 2 refills | Status: DC | PRN

## 2020-11-15 NOTE — Assessment & Plan Note (Signed)
-   Chronic, poorly controlled - Recommended pt. to discontinue Tylenol PM and Flexeril due to increased risk of falls and disorientation in patients over age 84 - Start Doxepin prn for insomnia - Will continue to monitor

## 2020-11-15 NOTE — Patient Instructions (Signed)
Preventive Care 40 Years and Older, Female Preventive care refers to lifestyle choices and visits with your health care provider that can promote health and wellness. This includes: A yearly physical exam. This is also called an annual wellness visit. Regular dental and eye exams. Immunizations. Screening for certain conditions. Healthy lifestyle choices, such as: Eating a healthy diet. Getting regular exercise. Not using drugs or products that contain nicotine and tobacco. Limiting alcohol use. What can I expect for my preventive care visit? Physical exam Your health care provider will check your: Height and weight. These may be used to calculate your BMI (body mass index). BMI is a measurement that tells if you are at a healthy weight. Heart rate and blood pressure. Body temperature. Skin for abnormal spots. Counseling Your health care provider may ask you questions about your: Past medical problems. Family's medical history. Alcohol, tobacco, and drug use. Emotional well-being. Home life and relationship well-being. Sexual activity. Diet, exercise, and sleep habits. History of falls. Memory and ability to understand (cognition). Work and work Statistician. Pregnancy and menstrual history. Access to firearms. What immunizations do I need? Vaccines are usually given at various ages, according to a schedule. Your health care provider will recommend vaccines for you based on your age, medical history, and lifestyle or other factors, such as travel or where you work. What tests do I need? Blood tests Lipid and cholesterol levels. These may be checked every 5 years, or more often depending on your overall health. Hepatitis C test. Hepatitis B test. Screening Lung cancer screening. You may have this screening every year starting at age 33 if you have a 30-pack-year history of smoking and currently smoke or have quit within the past 15 years. Colorectal cancer screening. All  adults should have this screening starting at age 73 and continuing until age 9. Your health care provider may recommend screening at age 31 if you are at increased risk. You will have tests every 1-10 years, depending on your results and the type of screening test. Diabetes screening. This is done by checking your blood sugar (glucose) after you have not eaten for a while (fasting). You may have this done every 1-3 years. Mammogram. This may be done every 1-2 years. Talk with your health care provider about how often you should have regular mammograms. Abdominal aortic aneurysm (AAA) screening. You may need this if you are a current or former smoker. BRCA-related cancer screening. This may be done if you have a family history of breast, ovarian, tubal, or peritoneal cancers. Other tests STD (sexually transmitted disease) testing, if you are at risk. Bone density scan. This is done to screen for osteoporosis. You may have this done starting at age 43. Talk with your health care provider about your test results, treatment options, and if necessary, the need for more tests. Follow these instructions at home: Eating and drinking  Eat a diet that includes fresh fruits and vegetables, whole grains, lean protein, and low-fat dairy products. Limit your intake of foods with high amounts of sugar, saturated fats, and salt. Take vitamin and mineral supplements as recommended by your health care provider. Do not drink alcohol if your health care provider tells you not to drink. If you drink alcohol: Limit how much you have to 0-1 drink a day. Be aware of how much alcohol is in your drink. In the [place] one drink equals one 12 oz bottle of beer (355 mL), one 5 oz glass of wine (148 mL), or one  1 oz glass of hard liquor (44 mL). Lifestyle Take daily care of your teeth and gums. Brush your teeth every morning and night with fluoride toothpaste. Floss one time each day. Stay active. Exercise for at least  30 minutes 5 or more days each week. Do not use any products that contain nicotine or tobacco, such as cigarettes, e-cigarettes, and chewing tobacco. If you need help quitting, ask your health care provider. Do not use drugs. If you are sexually active, practice safe sex. Use a condom or other form of protection in order to prevent STIs (sexually transmitted infections). Talk with your health care provider about taking a low-dose aspirin or statin. Find healthy ways to cope with stress, such as: Meditation, yoga, or listening to music. Journaling. Talking to a trusted person. Spending time with friends and family. Safety Always wear your seat belt while driving or riding in a vehicle. Do not drive: If you have been drinking alcohol. Do not ride with someone who has been drinking. When you are tired or distracted. While texting. Wear a helmet and other protective equipment during sports activities. If you have firearms in your house, make sure you follow all gun safety procedures. What's next? Visit your health care provider once a year for an annual wellness visit. Ask your health care provider how often you should have your eyes and teeth checked. Stay up to date on all vaccines. This information is not intended to replace advice given to you by your health care provider. Make sure you discuss any questions you have with your health care provider. Document Revised: 05/04/2020 Document Reviewed: 02/18/2018 Elsevier Patient Education  2022 [employer].

## 2020-11-15 NOTE — Assessment & Plan Note (Addendum)
-   Chronic with recent acute exacerbations - Likely 2/2 chronic GERD - Low suspicion for allergic, infectious, and cardiac etiologies due to absence of fevers, congestion, systemic symptoms, and negative cardiac and allergy history; not currently taking an ACE inhibitor - Continue omeprazole prn for reflux - Encouraged pt. to try TUMS prn for breakthrough symptoms

## 2020-11-15 NOTE — Assessment & Plan Note (Signed)
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 states that the cyst is painless and not currently bothersome - May consider referral to hand surgery for I&D or cyst removal if symptoms worsen

## 2020-11-15 NOTE — Assessment & Plan Note (Signed)
-   4-week history of persistent wound on LLE - Low suspicion for complicated wound healing or infection due to absence of warmth, swelling, erythema, pain, and systemic symptoms; wound is currently scabbed over - Refer to dermatology for further workup/management

## 2020-11-15 NOTE — Assessment & Plan Note (Signed)
-   Chronic with recent acute exacerbations - Continue omeprazole prn for reflux - Encouraged pt. to try TUMS prn for breakthrough symptoms

## 2020-11-15 NOTE — Assessment & Plan Note (Signed)
-   Chronic and stable - Continue HCTZ - Recheck CMP

## 2020-11-15 NOTE — Progress Notes (Signed)
Annual Wellness Visit     Patient: Brandy Wong, Female    DOB: 12-01-1934, 86 y.o.   MRN: [id number] Visit Date: 11/15/2020  Today's Provider: Lavon Paganini, MD   Chief Complaint  Patient presents with   Annual Exam   Subjective    Brandy Wong is a 86 y.o. female who presents today for her Annual Wellness Visit. She reports consuming a general diet. The patient does not participate in regular exercise at present. She generally feels well. She reports sleeping poorly. She does have additional problems to discuss today.   HPI  Dry cough - Pt. reports chronic dry cough, worse in the past month - States that cough used to be remedied by Omeprazole, but Omeprazole has been less effective in the past month - Cough self-resolved a week ago - Denies heart burn, dysphagia, fevers, LE edema, rhinitis, and history of allergies - Denies known triggers  Insomnia - Pt. has been taking Tylenol PM and Flexeril with minimal relief - Previously took Trazodone, but states that it made her less tired  Synovial cyst on PIP joint of L 5th Finger - Pt. reports a firm nodule over PIP joint of R 5th Finger for the past 6 months  Non-Healing Wound on L Medial Ankle - Pt. states that her dog scratched her left ankle approx 1 month ago and the wound has not yet healed - Has been scabbed over; denies tenderness, warmth, erythema, & swelling at the wound site - Denies fevers  Health Maintenance - Due for annual flu vaccine - UTD on Dexa scan, Tdap, Shingles, & pneumococcal vaccines   Medications: Outpatient Medications Prior to Visit  Medication Sig   calcium carbonate (OSCAL) 1500 (600 Ca) MG TABS tablet Take 600 mg of elemental calcium by mouth 2 (two) times daily with a meal.   Cholecalciferol 25 MCG (1000 UT) tablet Take 1 tablet by mouth daily.   Cranberry 125 MG TABS Take 1 tablet by mouth daily.   hydrochlorothiazide (HYDRODIURIL) 25 MG tablet TAKE 1 TABLET BY MOUTH EVERY DAY    MAGNESIUM PO Take 1 capsule by mouth daily.   omeprazole (PRILOSEC) 20 MG capsule TAKE 1 CAPSULE BY MOUTH EVERY DAY   Polyethyl Glycol-Propyl Glycol (SYSTANE OP) Apply 1 drop to eye 4 (four) times daily as needed.   SHINGRIX injection    TURMERIC PO Take 1 tablet by mouth daily.   [DISCONTINUED] diphenhydramine-acetaminophen (TYLENOL PM) 25-500 MG TABS tablet Take 1 tablet by mouth at bedtime as needed (every other night).   [DISCONTINUED] cyclobenzaprine (FLEXERIL) 5 MG tablet Take 1 tablet (5 mg total) by mouth at bedtime. (Patient not taking: Reported on 11/15/2020)   [DISCONTINUED] nortriptyline (PAMELOR) 10 MG capsule TAKE 3 CAPSULES (30 MG TOTAL) BY MOUTH AT BEDTIME. (Patient not taking: Reported on 11/15/2020)   No facility-administered medications prior to visit.    Allergies  Allergen Reactions   Amoxicillin Rash    Patient Care Team: Virginia Crews, MD as PCP - General (Family Medicine) Dasher, Rayvon Char, MD (Dermatology)  Review of Systems  Constitutional:  Negative for activity change, appetite change, fatigue and fever.  HENT: Negative.    Eyes: Negative.  Negative for visual disturbance.  Respiratory:  Positive for cough. Negative for chest tightness and shortness of breath.   Cardiovascular: Negative.  Negative for chest pain and leg swelling.  Gastrointestinal: Negative.   Endocrine: Negative.   Genitourinary: Negative.   Musculoskeletal:        ~  1 cm synovial cyst located on medial PIP joint of L 5th finger  Skin:        ~2 cm non-healing wound on L medial ankle  Neurological: Negative.   Psychiatric/Behavioral:  Positive for sleep disturbance.         Objective    Vitals: BP (!) 151/59   Pulse 72   Temp 98 F (36.7 C) (Oral)   Ht '5\' 1"'$  (1.549 m)   Wt 119 lb (54 kg)   SpO2 98%   BMI 22.48 kg/m     Physical Exam Constitutional:      General: She is not in acute distress.    Appearance: Normal appearance. She is normal weight.  HENT:     Head:  Normocephalic and atraumatic.     Right Ear: External ear normal.     Left Ear: External ear normal.  Eyes:     Conjunctiva/sclera: Conjunctivae normal.  Cardiovascular:     Rate and Rhythm: Normal rate and regular rhythm.     Pulses: Normal pulses.     Heart sounds: Normal heart sounds.  Pulmonary:     Effort: Pulmonary effort is normal.     Breath sounds: Normal breath sounds.  Abdominal:     General: Abdomen is flat. Bowel sounds are normal.     Palpations: Abdomen is soft.  Musculoskeletal:     Comments: ~1 cm synovial cyst located on medial PIP joint of L 5th finger  Skin:    General: Skin is warm and dry.     Findings: Lesion present.     Comments: ~2 cm non-healing scabbed wound on L medial ankle with surrounding hyperpigmentation; no swelling, erythema, or warmth; no open sores  Neurological:     Mental Status: She is alert.  Psychiatric:        Behavior: Behavior normal.        Thought Content: Thought content normal.    Most recent functional status assessment: In your present state of health, do you have any difficulty performing the following activities: 11/15/2020  Hearing? N  Vision? N  Difficulty concentrating or making decisions? N  Walking or climbing stairs? N  Dressing or bathing? N  Doing errands, shopping? N  Preparing Food and eating ? -  Using the Toilet? -  In the past six months, have you accidently leaked urine? -  Do you have problems with loss of bowel control? -  Managing your Medications? -  Managing your Finances? -  Housekeeping or managing your Housekeeping? -  Some recent data might be hidden   Most recent fall risk assessment: Fall Risk  11/15/2020  Falls in the past year? 0  Number falls in past yr: 0  Injury with Fall? 0  Risk for fall due to : No Fall Risks  Follow up -    Most recent depression screenings: PHQ 2/9 Scores 11/15/2020 11/16/2019  PHQ - 2 Score 0 0  PHQ- 9 Score 4 -   Most recent cognitive screening: 6CIT Screen  11/08/2019  What Year? 0 points  What month? 0 points  What time? 0 points  Count back from 20 0 points  Months in reverse 0 points  Repeat phrase 2 points  Total Score 2   Most recent Audit-C alcohol use screening Alcohol Use Disorder Test (AUDIT) 11/15/2020  1. How often do you have a drink containing alcohol? 3  2. How many drinks containing alcohol do you have on a typical day when you are  drinking? 0  3. How often do you have six or more drinks on one occasion? 0  AUDIT-C Score 3   A score of 3 or more in women, and 4 or more in men indicates increased risk for alcohol abuse, EXCEPT if all of the points are from question 1   No results found for any visits on 11/15/20.  Assessment & Plan     Annual wellness visit done today including the all of the following: Reviewed patient's Family Medical History Reviewed and updated list of patient's medical providers Assessment of cognitive impairment was done Assessed patient's functional ability Established a written schedule for health screening [place] Completed and Reviewed  Exercise Activities and Dietary recommendations  Goals      DIET - INCREASE WATER INTAKE     Recommend increasing water intake to 3 glasses a day.      Prevent falls     Recommend to remove any items from the home that may cause slips or trips.        Immunization History  Administered Date(s) Administered   Fluad Quad(high Dose 65+) 11/08/2018, 11/17/2019   Influenza, High Dose Seasonal PF 10/31/2016, 11/11/2017   PFIZER(Purple Top)SARS-COV-2 Vaccination 03/25/2019, 04/15/2019, 02/29/2020   Pneumococcal Conjugate-13 11/22/2014   Pneumococcal Polysaccharide-23 01/19/2017   Td 01/19/2017   Zoster Recombinat (Shingrix) 12/10/2018, 05/13/2019    Health Maintenance  Topic Date Due   COVID-19 Vaccine (4 - Booster for Pfizer series) 06/29/2020   INFLUENZA VACCINE  10/08/2020   DEXA SCAN  01/02/2022   TETANUS/TDAP  01/20/2027    PNA vac Low Risk Adult  Completed   Zoster Vaccines- Shingrix  Completed   HPV VACCINES  Aged Out     Discussed health benefits of physical activity, and encouraged her to engage in regular exercise appropriate for her age and condition.    Problem List Items Addressed This Visit       Cardiovascular and Mediastinum   HTN (hypertension)    - Chronic and stable - Continue HCTZ - Recheck CMP      Relevant Orders   Lipid panel   Comprehensive metabolic panel     Digestive   GERD (gastroesophageal reflux disease)    - Chronic with recent acute exacerbations - Continue omeprazole prn for reflux - Encouraged pt. to try TUMS prn for breakthrough symptoms        Musculoskeletal and Integument   Synovial cyst of hand    - Chronic and stable synovial cyst localized to medial PIP joint of L 5th finger - Pt. states that the cyst is painless and not currently bothersome - May consider referral to hand surgery for I&D or cyst removal if symptoms worsen        Other   Insomnia    - Chronic, poorly controlled - Recommended pt. to discontinue Tylenol PM and Flexeril due to increased risk of falls and disorientation in patients over age 1 - Start Doxepin prn for insomnia - Will continue to monitor      Chronic cough    - Chronic with recent acute exacerbations - Likely 2/2 chronic GERD - Low suspicion for allergic, infectious, and cardiac etiologies due to absence of fevers, congestion, systemic symptoms, and negative cardiac and allergy history; not currently taking an ACE inhibitor - Continue omeprazole prn for reflux - Encouraged pt. to try TUMS prn for breakthrough symptoms      Non-healing wound of lower extremity    - 4-week history  of persistent wound on LLE - Low suspicion for complicated wound healing or infection due to absence of warmth, swelling, erythema, pain, and systemic symptoms; wound is currently scabbed over - will f/u w dermatology for further  workup/management      Other Visit Diagnoses     Encounter for annual wellness visit (AWV) in Medicare patient    -  Primary   Encounter for annual physical exam        - Overall doing well  - Due for annual flu vaccine  - UTD on Dexa scan, Tdap, Shingles, & pneumococcal vaccines   - Follow-up in 6 months    Relevant Orders   Lipid panel   Comprehensive metabolic panel   Flu vaccine need            Return in about 6 months (around 05/15/2021) for chronic disease f/u.       Percell Locus, MS3    Patient seen along with MS3 student Percell Locus. I personally evaluated this patient along with the student, and verified all aspects of the history, physical exam, and medical decision making as documented by the student. I agree with the student's documentation and have made all necessary edits.  Sante Biedermann, Dionne Bucy, MD, MPH [facility]

## 2020-11-16 ENCOUNTER — Telehealth: Payer: Self-pay

## 2020-11-16 DIAGNOSIS — I10 Essential (primary) hypertension: Secondary | ICD-10-CM

## 2020-11-16 LAB — COMPREHENSIVE METABOLIC PANEL
ALT: 16 IU/L (ref 0–32)
AST: 26 IU/L (ref 0–40)
Albumin/Globulin Ratio: 2 (ref 1.2–2.2)
Albumin: 4.9 g/dL — ABNORMAL HIGH (ref 3.6–4.6)
Alkaline Phosphatase: 76 IU/L (ref 44–121)
BUN/Creatinine Ratio: 26 (ref 12–28)
BUN: 16 mg/dL (ref 8–27)
Bilirubin Total: 0.3 mg/dL (ref 0.0–1.2)
CO2: 27 mmol/L (ref 20–29)
Calcium: 10.5 mg/dL — ABNORMAL HIGH (ref 8.7–10.3)
Chloride: 93 mmol/L — ABNORMAL LOW (ref 96–106)
Creatinine, Ser: 0.62 mg/dL (ref 0.57–1.00)
Globulin, Total: 2.5 g/dL (ref 1.5–4.5)
Glucose: 97 mg/dL (ref 65–99)
Potassium: 4.2 mmol/L (ref 3.5–5.2)
Sodium: 133 mmol/L — ABNORMAL LOW (ref 134–144)
Total Protein: 7.4 g/dL (ref 6.0–8.5)
eGFR: 88 mL/min/{1.73_m2} (ref >59)

## 2020-11-16 LAB — LIPID PANEL
Chol/HDL Ratio: 2.9 ratio (ref 0.0–4.4)
Cholesterol, Total: 304 mg/dL — ABNORMAL HIGH (ref 100–199)
HDL: 106 mg/dL (ref >39)
LDL Chol Calc (NIH): 186 mg/dL — ABNORMAL HIGH (ref 0–99)
Triglycerides: 77 mg/dL (ref 0–149)
VLDL Cholesterol Cal: 12 mg/dL (ref 5–40)

## 2020-11-16 NOTE — Telephone Encounter (Signed)
-----   Message from Virginia Crews, MD sent at 11/16/2020  8:17 AM EDT ----- Cholesterol is increasing - recommend diet low in saturated fat and regular exercise - 30 min at least 5 times per week. High calcium, low sodium.  Recommend holding multivitamin or Ca supplement. Hydrate well. Repeat CMP in 2-4 weeks. With fractionated Ca and PTH.

## 2020-11-19 ENCOUNTER — Telehealth: Payer: Self-pay

## 2020-11-19 NOTE — Telephone Encounter (Signed)
LVM for pt to call office and reschedule AWV that was canceled.   May forward note back to me if patient can schedule during the week.

## 2020-12-03 DIAGNOSIS — I10 Essential (primary) hypertension: Secondary | ICD-10-CM | POA: Diagnosis not present

## 2020-12-04 LAB — COMPREHENSIVE METABOLIC PANEL
ALT: 16 IU/L (ref 0–32)
AST: 23 IU/L (ref 0–40)
Albumin/Globulin Ratio: 1.8 (ref 1.2–2.2)
Albumin: 4.6 g/dL (ref 3.6–4.6)
Alkaline Phosphatase: 73 IU/L (ref 44–121)
BUN/Creatinine Ratio: 16 (ref 12–28)
BUN: 10 mg/dL (ref 8–27)
Bilirubin Total: 0.4 mg/dL (ref 0.0–1.2)
CO2: 24 mmol/L (ref 20–29)
Calcium: 10.1 mg/dL (ref 8.7–10.3)
Chloride: 94 mmol/L — ABNORMAL LOW (ref 96–106)
Creatinine, Ser: 0.62 mg/dL (ref 0.57–1.00)
Globulin, Total: 2.5 g/dL (ref 1.5–4.5)
Glucose: 95 mg/dL (ref 70–99)
Potassium: 4.1 mmol/L (ref 3.5–5.2)
Sodium: 134 mmol/L (ref 134–144)
Total Protein: 7.1 g/dL (ref 6.0–8.5)
eGFR: 88 mL/min/{1.73_m2} (ref >59)

## 2020-12-04 LAB — PTH, INTACT AND CALCIUM: PTH: 22 pg/mL (ref 15–65)

## 2020-12-06 NOTE — Progress Notes (Signed)
Not at this time. Calcium has normalized due to stopping it.

## 2020-12-27 DIAGNOSIS — Z1231 Encounter for screening mammogram for malignant neoplasm of breast: Secondary | ICD-10-CM | POA: Diagnosis not present

## 2020-12-31 ENCOUNTER — Encounter: Payer: Self-pay | Admitting: Internal Medicine

## 2021-01-29 ENCOUNTER — Other Ambulatory Visit: Payer: Self-pay | Admitting: Family Medicine

## 2021-01-29 DIAGNOSIS — I10 Essential (primary) hypertension: Secondary | ICD-10-CM

## 2021-02-06 ENCOUNTER — Encounter: Payer: Self-pay | Admitting: Internal Medicine

## 2021-02-06 ENCOUNTER — Ambulatory Visit: Payer: Medicare HMO | Admitting: Internal Medicine

## 2021-02-06 VITALS — BP 130/80 | HR 96 | Ht 61.0 in | Wt 122.4 lb

## 2021-02-06 DIAGNOSIS — R059 Cough, unspecified: Secondary | ICD-10-CM

## 2021-02-06 DIAGNOSIS — R1310 Dysphagia, unspecified: Secondary | ICD-10-CM

## 2021-02-06 DIAGNOSIS — K219 Gastro-esophageal reflux disease without esophagitis: Secondary | ICD-10-CM

## 2021-02-06 MED ORDER — OMEPRAZOLE 40 MG PO CPDR: 40.0000 mg | DELAYED_RELEASE_CAPSULE | Freq: Every day | ORAL | 3 refills | Status: DC

## 2021-02-06 NOTE — Patient Instructions (Addendum)
If you are age 84 or older, your body mass index should be between 23-30. Your Body mass index is 23.12 kg/m. If this is out of the aforementioned range listed, please consider follow up with your Primary Care Provider.  If you are age 67 or younger, your body mass index should be between 19-25. Your Body mass index is 23.12 kg/m. If this is out of the aformentioned range listed, please consider follow up with your Primary Care Provider.   ________________________________________________________  The [facility] providers would like to encourage you to use [facility] to communicate with providers for non-urgent requests or questions.  Due to long hold times on the telephone, sending your provider a message by [facility] may be a faster and more efficient way to get a response.  Please allow 48 business hours for a response.  Please remember that this is for non-urgent requests.  _______________________________________________________  We have sent the following medications to your pharmacy for you to pick up at your convenience:  Omeprazole  You have been scheduled for an endoscopy. Please follow written instructions given to you at your visit today. If you use inhalers (even only as needed), please bring them with you on the day of your procedure.

## 2021-02-06 NOTE — Progress Notes (Signed)
HISTORY OF PRESENT ILLNESS:  Brandy Wong is a 86 y.o. female with limited past medical history as listed below.  She presents today, self-referred, regarding problems with chronic cough possibly related to GERD.  She obtained her previous GI care in [facility].  No records, but the patient is a good historian.  She tells me that she has had problems with intermittent dry cough of varying frequency for about 10 years.  At one point, she tells me that she was diagnosed with GERD (despite not having significant amounts of heartburn) and was placed on PPI.  This seemed to help.  She tells me that she has been on PPI for about 7 or 8 years.  About the time she initiated PPI therapy she did undergo upper endoscopy.  She is not sure of the results.  Possibly had an esophageal dilation.  Over the past 6 weeks she noticed more frequent cough.  She describes it as dry.  Occurs mostly during the day.  She increased her omeprazole from 20 mg daily to 40 mg daily.  Her symptoms improved.  She resumed omeprazole at 20 mg daily.  The cough slightly worsened.  She does have occasional dysphagia to pills and foods which are solid.  1 episode of what sounds like oropharyngeal dysphagia with cough.  Her GI review of systems is otherwise entirely negative.  She did see her PCP for evaluation November 15, 2020.  I have reviewed that encounter.  Also, outside blood work from December 03, 2020 shows unremarkable comprehensive metabolic panel.  Normal liver test.  She denies new medications or dietary change.  REVIEW OF SYSTEMS:  All non-GI ROS negative unless otherwise stated in the HPI except for sleeping problems, arthritis,  Past Medical History:  Diagnosis Date   Arthritis    Cancer ([place]) 2013   R breast, s/p radiation and lumpectomy   GERD (gastroesophageal reflux disease)    Hypertension     Past Surgical History:  Procedure Laterality Date   APPENDECTOMY     BREAST SURGERY  2013   lumpectomy    CATARACT EXTRACTION     LAPAROSCOPIC VAGINAL HYSTERECTOMY WITH SALPINGO OOPHORECTOMY     TUBAL LIGATION      Social History Brandy Wong  reports that she quit smoking about 33 years ago. Her smoking use included cigarettes. She has a 1.50 pack-year smoking history. She has never used smokeless tobacco. She reports current alcohol use of about 7.0 standard drinks per week. She reports that she does not use drugs.  family history includes Dementia (age of onset: 54) in her mother; GER disease in her mother; Heart attack in her father; Stroke in her father.  Allergies  Allergen Reactions   Amoxicillin Rash       PHYSICAL EXAMINATION: Vital signs: BP 130/80   Pulse 96   Ht 5\' 1"  (1.549 m)   Wt 122 lb 6 oz (55.5 kg)   SpO2 (!) 88%   BMI 23.12 kg/m   Constitutional: generally well-appearing, no acute distress Psychiatric: alert and oriented x3, cooperative Eyes: extraocular movements intact, anicteric, conjunctiva pink Mouth: oral pharynx moist, no lesions Neck: supple no lymphadenopathy Cardiovascular: heart regular rate and rhythm, no murmur Lungs: clear to auscultation bilaterally Abdomen: soft, nontender, nondistended, no obvious ascites, no peritoneal signs, normal bowel sounds, no organomegaly Rectal: Omitted Extremities: no clubbing cyanosis or lower extremity edema bilaterally Skin: no lesions on visible extremities Neuro: No focal deficits.  Cranial nerves intact  ASSESSMENT:  1.  Chronic cough.  Etiology uncertain.  Possibly related to GERD.  Historically reports improvement on PPI though has not gone any extensive periods of time off PPI.  Recently increased her PPI which temporarily helped. 2.  Mild intermittent dysphagia to pills and solids.  Rule out peptic stricture   PLAN:  1.  Reflux precautions 2.  Prescribe omeprazole 40 mg daily.  Medication risks reviewed. 3.  Schedule upper endoscopy with possible esophageal dilation.The nature of the procedure, as  well as the risks, benefits, and alternatives were carefully and thoroughly reviewed with the patient. Ample time for discussion and questions allowed. The patient understood, was satisfied, and agreed to proceed.

## 2021-02-18 IMAGING — CR DG CHEST 2V
1 series · 2 of 2 positions shown · non-contrast
Comparison: No pertinent prior studies available for comparison.

CLINICAL DATA: Possible aspiration.

EXAM:
CHEST - 2 VIEW

[Series 1: dg chest 2 view · 0.14mm/px · 2 of 2 slices shown]
[im 1/2]
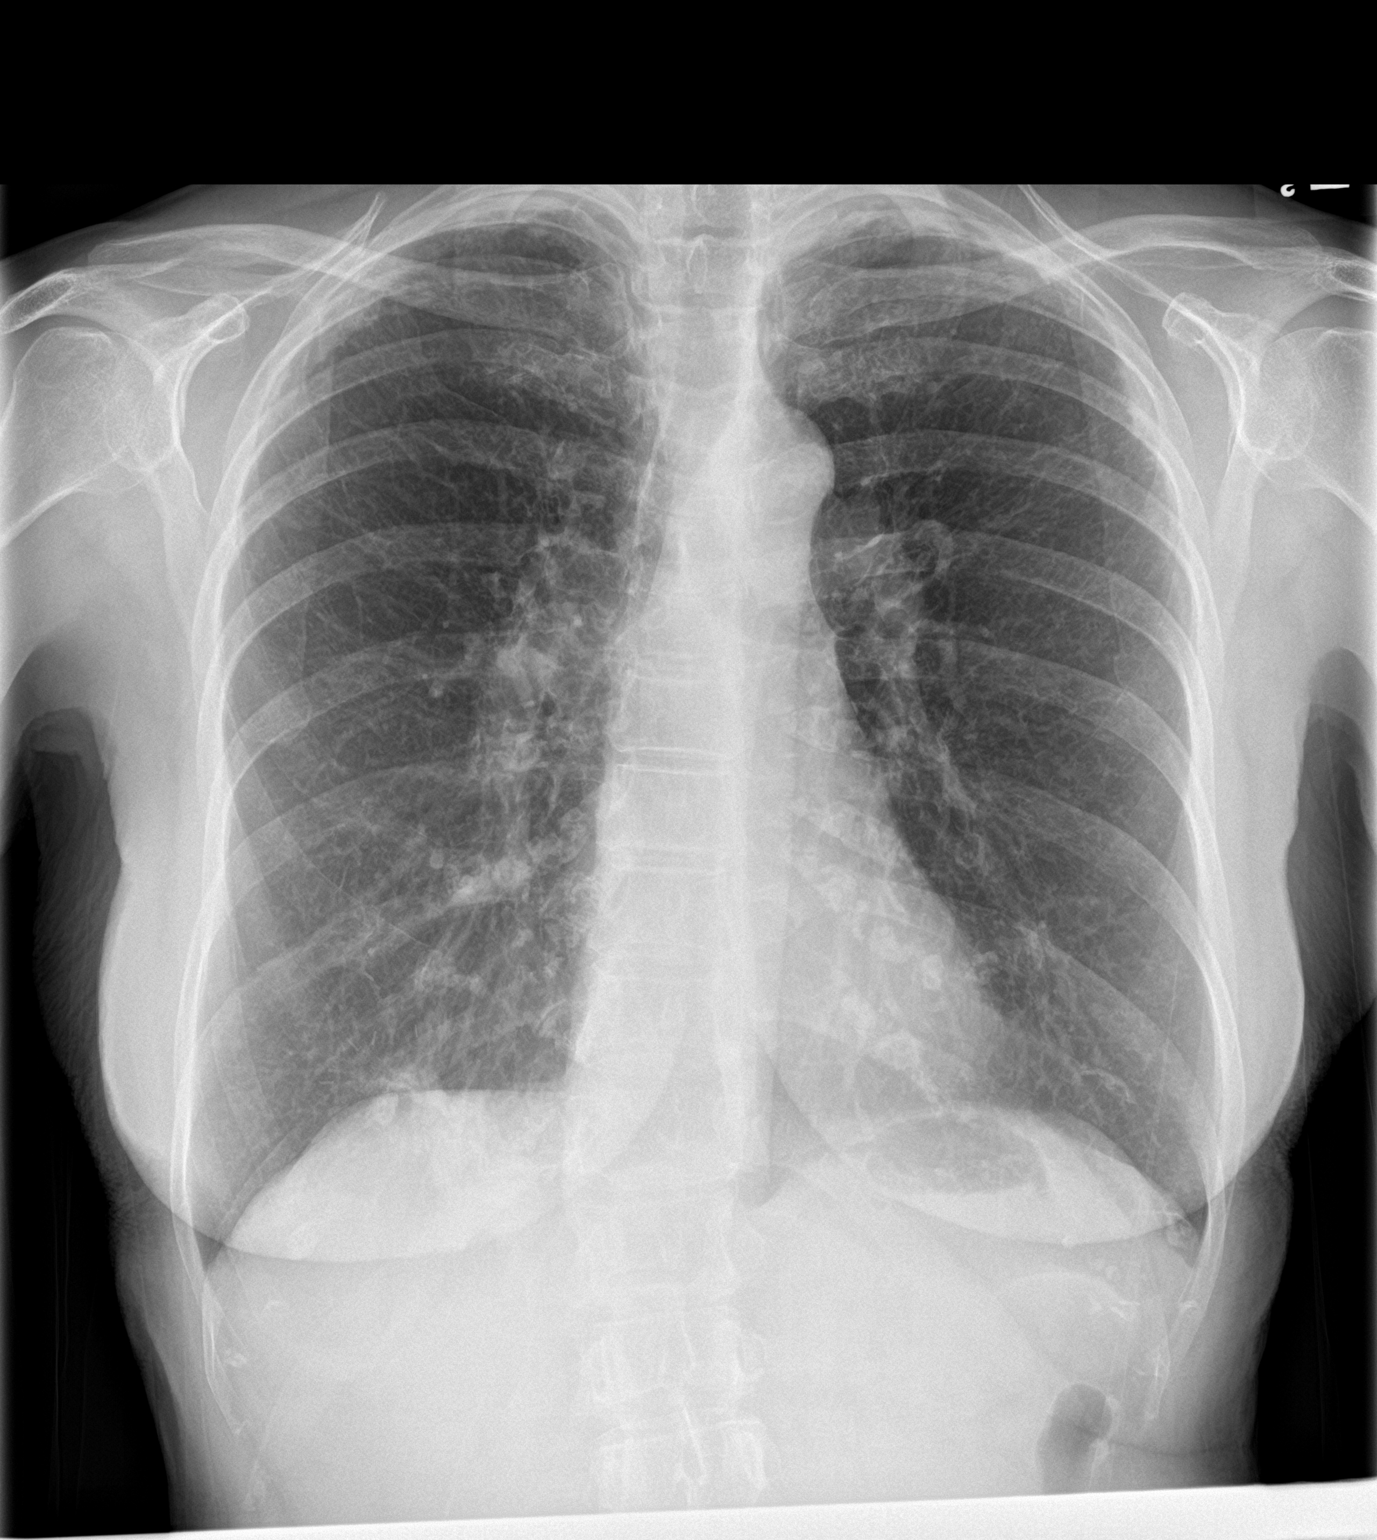
[im 2/2]
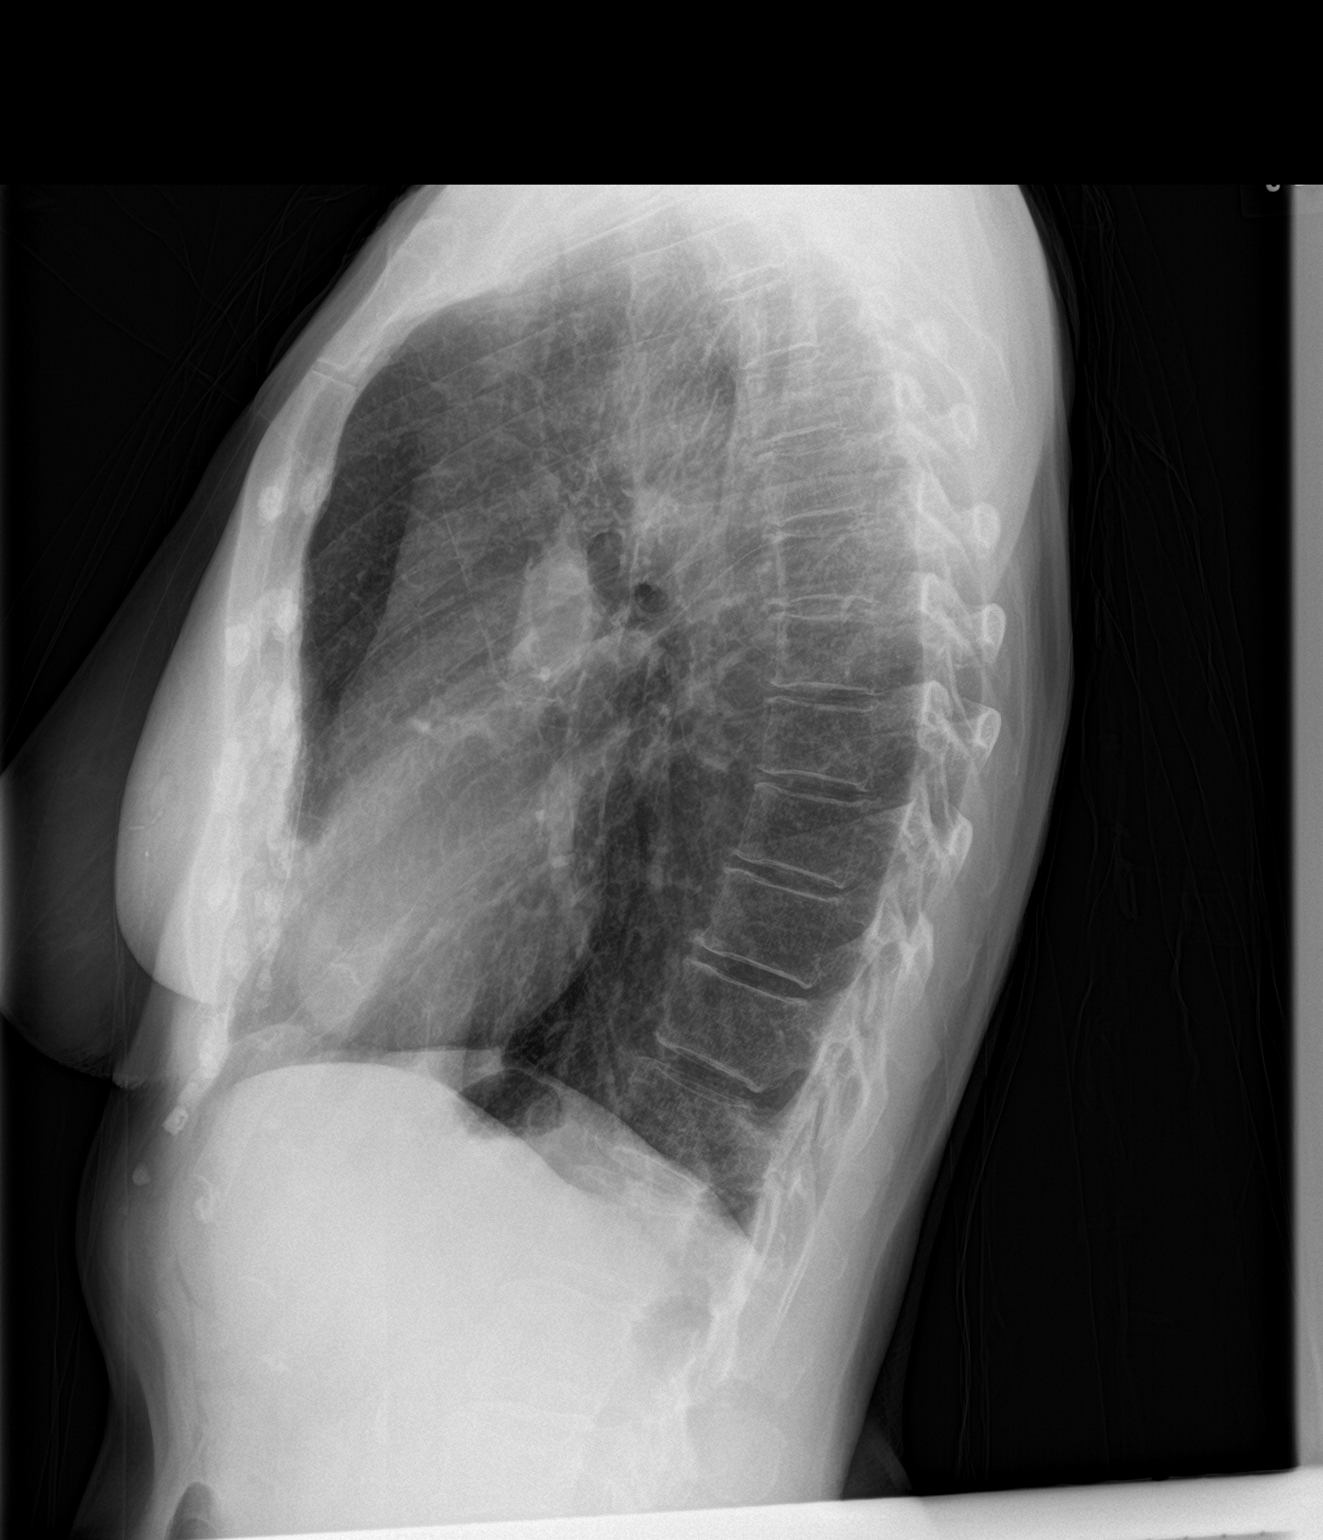

[2 of 2 positions shown; findings below may reference images not displayed]

FINDINGS: Heart size within normal limits. No focal consolidation within the
lungs. No evidence of pleural effusion or pneumothorax. No acute
bony abnormality. No radiopaque foreign body is definitive
identified.
IMPRESSION: No focal consolidation within the lungs.

No radiopaque foreign body definitively identified.

## 2021-03-12 DIAGNOSIS — H61002 Unspecified perichondritis of left external ear: Secondary | ICD-10-CM | POA: Diagnosis not present

## 2021-03-12 DIAGNOSIS — X32XXXA Exposure to sunlight, initial encounter: Secondary | ICD-10-CM | POA: Diagnosis not present

## 2021-03-12 DIAGNOSIS — D2271 Melanocytic nevi of right lower limb, including hip: Secondary | ICD-10-CM | POA: Diagnosis not present

## 2021-03-12 DIAGNOSIS — Z85828 Personal history of other malignant neoplasm of skin: Secondary | ICD-10-CM | POA: Diagnosis not present

## 2021-03-12 DIAGNOSIS — L570 Actinic keratosis: Secondary | ICD-10-CM | POA: Diagnosis not present

## 2021-03-12 DIAGNOSIS — D2261 Melanocytic nevi of right upper limb, including shoulder: Secondary | ICD-10-CM | POA: Diagnosis not present

## 2021-03-12 DIAGNOSIS — D485 Neoplasm of uncertain behavior of skin: Secondary | ICD-10-CM | POA: Diagnosis not present

## 2021-03-12 DIAGNOSIS — D0472 Carcinoma in situ of skin of left lower limb, including hip: Secondary | ICD-10-CM | POA: Diagnosis not present

## 2021-03-12 DIAGNOSIS — L300 Nummular dermatitis: Secondary | ICD-10-CM | POA: Diagnosis not present

## 2021-03-14 DIAGNOSIS — Z0100 Encounter for examination of eyes and vision without abnormal findings: Secondary | ICD-10-CM | POA: Diagnosis not present

## 2021-03-21 ENCOUNTER — Ambulatory Visit (AMBULATORY_SURGERY_CENTER): Payer: Medicare HMO | Admitting: Internal Medicine

## 2021-03-21 ENCOUNTER — Encounter: Payer: Self-pay | Admitting: Internal Medicine

## 2021-03-21 VITALS — BP 151/66 | HR 68 | Resp 12

## 2021-03-21 DIAGNOSIS — K449 Diaphragmatic hernia without obstruction or gangrene: Secondary | ICD-10-CM

## 2021-03-21 DIAGNOSIS — R1310 Dysphagia, unspecified: Secondary | ICD-10-CM

## 2021-03-21 DIAGNOSIS — R059 Cough, unspecified: Secondary | ICD-10-CM | POA: Diagnosis not present

## 2021-03-21 DIAGNOSIS — K219 Gastro-esophageal reflux disease without esophagitis: Secondary | ICD-10-CM

## 2021-03-21 MED ORDER — SODIUM CHLORIDE 0.9 % IV SOLN
500.0000 mL | Freq: Once | INTRAVENOUS | Status: DC
Start: 2021-03-21 — End: 2021-03-21

## 2021-03-21 MED ORDER — OMEPRAZOLE 40 MG PO CPDR: 40.0000 mg | DELAYED_RELEASE_CAPSULE | Freq: Every day | ORAL | 4 refills | Status: DC

## 2021-03-21 NOTE — Progress Notes (Signed)
Pt in recovery with monitors in place, VSS. Report given to receiving RN. Bite guard was placed with pt awake to ensure comfort. No dental or soft tissue damage noted. 

## 2021-03-21 NOTE — Op Note (Signed)
[facility] Patient Name: Brandy Wong Procedure Date: 03/21/2021 10:24 AM MRN: [id number] Endoscopist: Docia Chuck. Henrene Pastor , MD Age: 87 Referring MD:  Date of Birth: 1934-10-17 Gender: Female Account #: [id number] Procedure:                Upper GI endoscopy Indications:              Dysphagia, Esophageal reflux, Chronic cough Medicines:                Monitored Anesthesia Care Procedure:                Pre-Anesthesia Assessment:                           - Prior to the procedure, a History and Physical                            was performed, and patient medications and                            allergies were reviewed. The patient's tolerance of                            previous anesthesia was also reviewed. The risks                            and benefits of the procedure and the sedation                            options and risks were discussed with the patient.                            All questions were answered, and informed consent                            was obtained. Prior Anticoagulants: The patient has                            taken no previous anticoagulant or antiplatelet                            agents. ASA Grade Assessment: II - A patient with                            mild systemic disease. After reviewing the risks                            and benefits, the patient was deemed in                            satisfactory condition to undergo the procedure.                           After obtaining informed consent, the endoscope was  passed under direct vision. Throughout the                            procedure, the patient's blood pressure, pulse, and                            oxygen saturations were monitored continuously. The                            [place] #[id number] was introduced through                            the mouth, and advanced to the second part of                            duodenum. The  upper GI endoscopy was accomplished                            without difficulty. The patient tolerated the                            procedure well. Scope In: Scope Out: Findings:                 The esophagus was normal.                           The stomach was normal. Small hiatal hernia.                           The examined duodenum was normal.                           The cardia and gastric fundus were normal on                            retroflexion. Complications:            No immediate complications. Estimated Blood Loss:     Estimated blood loss: none. Impression:               1. Normal EGD                           2. GERD                           3. Chronic cough. Recommendation:           - Patient has a contact number available for                            emergencies. The signs and symptoms of potential                            delayed complications were discussed with the  patient. Return to normal activities tomorrow.                            Written discharge instructions were provided to the                            patient.                           - Resume previous diet.                           - Continue present medications.                           - Please make sure that the patient has 1 year of                            refills on her omeprazole 40 mg daily                           - Return to the care of your primary provider Docia Chuck. Henrene Pastor, MD 03/21/2021 10:55:33 AM This report has been signed electronically.

## 2021-03-21 NOTE — Patient Instructions (Signed)
Resume previous diet and medications. Return to the care of your primary care doctor.  YOU HAD AN ENDOSCOPIC PROCEDURE TODAY AT [facility]:   Refer to the procedure report that was given to you for any specific questions about what was found during the examination.  If the procedure report does not answer your questions, please call your gastroenterologist to clarify.  If you requested that your care partner not be given the details of your procedure findings, then the procedure report has been included in a sealed envelope for you to review at your convenience later.  YOU SHOULD EXPECT: Some feelings of bloating in the abdomen. Passage of more gas than usual.  Walking can help get rid of the air that was put into your GI tract during the procedure and reduce the bloating. If you had a lower endoscopy (such as a colonoscopy or flexible sigmoidoscopy) you may notice spotting of blood in your stool or on the toilet paper. If you underwent a bowel prep for your procedure, you may not have a normal bowel movement for a few days.  Please Note:  You might notice some irritation and congestion in your nose or some drainage.  This is from the oxygen used during your procedure.  There is no need for concern and it should clear up in a day or so.  SYMPTOMS TO REPORT IMMEDIATELY:  Following upper endoscopy (EGD)  Vomiting of blood or coffee ground material  New chest pain or pain under the shoulder blades  Painful or persistently difficult swallowing  New shortness of breath  Fever of 100F or higher  Black, tarry-looking stools  For urgent or emergent issues, a gastroenterologist can be reached at any hour by calling [phone]. Do not use MyChart messaging for urgent concerns.    DIET:  We do recommend a small meal at first, but then you may proceed to your regular diet.  Drink plenty of fluids but you should avoid alcoholic beverages for 24 hours.  ACTIVITY:  You should plan to  take it easy for the rest of today and you should NOT DRIVE or use heavy machinery until tomorrow (because of the sedation medicines used during the test).    FOLLOW UP: Our staff will call the number listed on your records 48-72 hours following your procedure to check on you and address any questions or concerns that you may have regarding the information given to you following your procedure. If we do not reach you, we will leave a message.  We will attempt to reach you two times.  During this call, we will ask if you have developed any symptoms of COVID 19. If you develop any symptoms (ie: fever, flu-like symptoms, shortness of breath, cough etc.) before then, please call [phone].  If you test positive for Covid 19 in the 2 weeks post procedure, please call and report this information to [place].    If any biopsies were taken you will be contacted by phone or by letter within the next 1-3 weeks.  Please call us at [phone] if you have not heard about the biopsies in 3 weeks.    SIGNATURES/CONFIDENTIALITY: You and/or your care partner have signed paperwork which will be entered into your electronic medical record.  These signatures attest to the fact that that the information above on your After Visit Summary has been reviewed and is understood.  Full responsibility of the confidentiality of this discharge information lies with you and/or your care-partner.

## 2021-03-21 NOTE — Progress Notes (Signed)
HISTORY OF PRESENT ILLNESS:   Brandy Wong is a 87 y.o. female with limited past medical history as listed below.  She presents today, self-referred, regarding problems with chronic cough possibly related to GERD.  She obtained her previous GI care in [facility].  No records, but the patient is a good historian.  She tells me that she has had problems with intermittent dry cough of varying frequency for about 10 years.  At one point, she tells me that she was diagnosed with GERD (despite not having significant amounts of heartburn) and was placed on PPI.  This seemed to help.  She tells me that she has been on PPI for about 7 or 8 years.  About the time she initiated PPI therapy she did undergo upper endoscopy.  She is not sure of the results.  Possibly had an esophageal dilation.  Over the past 6 weeks she noticed more frequent cough.  She describes it as dry.  Occurs mostly during the day.  She increased her omeprazole from 20 mg daily to 40 mg daily.  Her symptoms improved.  She resumed omeprazole at 20 mg daily.  The cough slightly worsened.  She does have occasional dysphagia to pills and foods which are solid.  1 episode of what sounds like oropharyngeal dysphagia with cough.  Her GI review of systems is otherwise entirely negative.  She did see her PCP for evaluation November 15, 2020.  I have reviewed that encounter.  Also, outside blood work from December 03, 2020 shows unremarkable comprehensive metabolic panel.  Normal liver test.  She denies new medications or dietary change.   REVIEW OF SYSTEMS:   All non-GI ROS negative unless otherwise stated in the HPI except for sleeping problems, arthritis,       Past Medical History:  Diagnosis Date   Arthritis     Cancer ([place]) 2013    R breast, s/p radiation and lumpectomy   GERD (gastroesophageal reflux disease)     Hypertension             Past Surgical History:  Procedure Laterality Date   APPENDECTOMY       BREAST SURGERY    2013    lumpectomy   CATARACT EXTRACTION       LAPAROSCOPIC VAGINAL HYSTERECTOMY WITH SALPINGO OOPHORECTOMY       TUBAL LIGATION          Social History Brandy Wong  reports that she quit smoking about 33 years ago. Her smoking use included cigarettes. She has a 1.50 pack-year smoking history. She has never used smokeless tobacco. She reports current alcohol use of about 7.0 standard drinks per week. She reports that she does not use drugs.   family history includes Dementia (age of onset: 71) in her mother; GER disease in her mother; Heart attack in her father; Stroke in her father.       Allergies  Allergen Reactions   Amoxicillin Rash          PHYSICAL EXAMINATION: Vital signs: BP 130/80    Pulse 96    Ht 5\' 1"  (1.549 m)    Wt 122 lb 6 oz (55.5 kg)    SpO2 (!) 88%    BMI 23.12 kg/m   Constitutional: generally well-appearing, no acute distress Psychiatric: alert and oriented x3, cooperative Eyes: extraocular movements intact, anicteric, conjunctiva pink Mouth: oral pharynx moist, no lesions Neck: supple no lymphadenopathy Cardiovascular: heart regular rate and rhythm, no murmur Lungs: clear  to auscultation bilaterally Abdomen: soft, nontender, nondistended, no obvious ascites, no peritoneal signs, normal bowel sounds, no organomegaly Rectal: Omitted Extremities: no clubbing cyanosis or lower extremity edema bilaterally Skin: no lesions on visible extremities Neuro: No focal deficits.  Cranial nerves intact   ASSESSMENT:   1.  Chronic cough.  Etiology uncertain.  Possibly related to GERD.  Historically reports improvement on PPI though has not gone any extensive periods of time off PPI.  Recently increased her PPI which temporarily helped. 2.  Mild intermittent dysphagia to pills and solids.  Rule out peptic stricture     PLAN:   1.  Reflux precautions 2.  Prescribe omeprazole 40 mg daily.  Medication risks reviewed. 3.  Schedule upper endoscopy with possible  esophageal dilation.The nature of the procedure, as well as the risks, benefits, and alternatives were carefully and thoroughly reviewed with the patient. Ample time for discussion and questions allowed. The patient understood, was satisfied, and agreed to proceed.   Seen in the office February 06, 2021 regarding GERD, chronic cough, dysphagia.  Above is the complete H&P from her encounter.  There have been no interval changes.  She is now for upper endoscopy

## 2021-03-22 ENCOUNTER — Other Ambulatory Visit: Payer: Self-pay | Admitting: Family Medicine

## 2021-03-22 DIAGNOSIS — K219 Gastro-esophageal reflux disease without esophagitis: Secondary | ICD-10-CM

## 2021-03-22 NOTE — Telephone Encounter (Signed)
Requested medication (s) are due for refill today: NO  Requested medication (s) are on the active medication list: dose inconsistent with current med list  Notes to clinic:  This med was ordered by provider not associated with this practice but the dose is inconsistent with current med list, please assess.  Requested Prescriptions  Pending Prescriptions Disp Refills   omeprazole (PRILOSEC) 20 MG capsule [Pharmacy Med Name: OMEPRAZOLE DR 20 MG CAPSULE] 90 capsule 1    Sig: TAKE 1 CAPSULE BY MOUTH EVERY DAY     Gastroenterology: Proton Pump Inhibitors Passed - 03/22/2021  1:31 AM      Passed - Valid encounter within last 12 months    Recent Outpatient Visits           4 months ago Encounter for annual wellness visit (AWV) in Medicare patient   [facility], Dionne Bucy, MD   1 year ago Cystitis with hematuria   [facility], Dionne Bucy, MD   1 year ago Annual physical exam   [facility], Dionne Bucy, MD   2 years ago Encounter for annual physical exam   [facility], Dionne Bucy, MD   3 years ago Encounter for annual physical exam   [facility], Dionne Bucy, MD       Future Appointments             In 1 month [place], Dionne Bucy, MD [facility], [place]

## 2021-03-25 ENCOUNTER — Telehealth: Payer: Self-pay

## 2021-03-25 NOTE — Telephone Encounter (Signed)
Left message on answering machine. 

## 2021-04-29 ENCOUNTER — Other Ambulatory Visit: Payer: Self-pay | Admitting: Family Medicine

## 2021-04-29 DIAGNOSIS — I10 Essential (primary) hypertension: Secondary | ICD-10-CM

## 2021-04-29 DIAGNOSIS — K219 Gastro-esophageal reflux disease without esophagitis: Secondary | ICD-10-CM

## 2021-05-02 DIAGNOSIS — D0471 Carcinoma in situ of skin of right lower limb, including hip: Secondary | ICD-10-CM | POA: Diagnosis not present

## 2021-05-16 ENCOUNTER — Ambulatory Visit: Payer: Medicare HMO | Admitting: Family Medicine

## 2021-05-20 DIAGNOSIS — R350 Frequency of micturition: Secondary | ICD-10-CM | POA: Diagnosis not present

## 2021-05-20 DIAGNOSIS — N390 Urinary tract infection, site not specified: Secondary | ICD-10-CM | POA: Diagnosis not present

## 2021-05-20 NOTE — Progress Notes (Deleted)
?  ? ? ?Established patient visit ? ? ?Patient: Brandy Wong   DOB: [birth year]   87 y.o. Female  MRN: [id number] ?Visit Date: 05/21/2021 ? ?Today's healthcare provider: Lavon Paganini, MD  ? ?No chief complaint on file. ? ?Subjective  ?  ?HPI  ?Hypertension, follow-up ? ?BP Readings from Last 3 Encounters:  ?03/21/21 (!) 151/66  ?02/06/21 130/80  ?11/15/20 (!) 151/59  ? Wt Readings from Last 3 Encounters:  ?02/06/21 122 lb 6 oz (55.5 kg)  ?11/15/20 119 lb (54 kg)  ?02/06/20 122 lb (55.3 kg)  ?  ? ?She was last seen for hypertension 6 months ago.  ?BP at that visit was 151/59. Management since that visit includes no changes. ? ?She reports {excellent/good/fair/poor:19665} compliance with treatment. ?She {is/is not:9024} having side effects. {document side effects if present:1} ?She is following a {diet:21022986} diet. ?She {is/is not:9024} exercising. ?She {does/does not:200015} smoke. ? ?Use of agents associated with hypertension: {bp agents assoc with hypertension:511::"none"}.  ? ?Outside blood pressures are {***enter patient reported home BP readings, or 'not being checked':1}. ?Symptoms: ?{Yes/No:20286} chest pain {Yes/No:20286} chest pressure  ?{Yes/No:20286} palpitations {Yes/No:20286} syncope  ?{Yes/No:20286} dyspnea {Yes/No:20286} orthopnea  ?{Yes/No:20286} paroxysmal nocturnal dyspnea {Yes/No:20286} lower extremity edema  ? ?Pertinent labs: ?Lab Results  ?Component Value Date  ? CHOL 304 (H) 11/15/2020  ? HDL 106 11/15/2020  ? LDLCALC 186 (H) 11/15/2020  ? TRIG 77 11/15/2020  ? CHOLHDL 2.9 11/15/2020  ? Lab Results  ?Component Value Date  ? NA 134 12/03/2020  ? K 4.1 12/03/2020  ? CREATININE 0.62 12/03/2020  ? EGFR 88 12/03/2020  ? GLUCOSE 95 12/03/2020  ?  ? ?The ASCVD Risk score (Arnett DK, et al., 2019) failed to calculate for the following reasons: ?  The 2019 ASCVD risk score is only valid for ages 38 to 70   ? ?--------------------------------------------------------------------------------------------------- ?GERD, Follow up: ? ?The patient was last seen for GERD 6 months ago. ?Changes made since that visit include continue omeprazole prn, try Tums for breakthrough symptoms. ? ?She reports {excellent/good/fair/poor:19665} compliance with treatment. ?She {is/is not:21021397} having side effects. ***. ? ?She IS experiencing {Sx; ge reflux:19417}. ?She is NOT experiencing {Sx; ge reflux:19417:o} ? ?----------------------------------------------------------------------------------------- ?Follow up for insomnia ? ?The patient was last seen for this 6 months ago. ?Changes made at last visit include D/C Tylenol PM and Flexeril due to increased fall risk. Start Doxepin PRN. ? ?She reports {excellent/good/fair/poor:19665} compliance with treatment. ?She feels that condition is {improved/worse/unchanged:3041574}. ?She {is/is not:21021397} having side effects. *** ? ?----------------------------------------------------------------------------------------- ? ? ?Medications: ?Outpatient Medications Prior to Visit  ?Medication Sig  ? Cholecalciferol 25 MCG (1000 UT) tablet Take 1 tablet by mouth daily.  ? Cranberry 125 MG TABS Take 1 tablet by mouth daily.  ? hydrochlorothiazide (HYDRODIURIL) 25 MG tablet TAKE 1 TABLET BY MOUTH EVERY DAY  ? MAGNESIUM PO Take 1 capsule by mouth daily.  ? omeprazole (PRILOSEC) 20 MG capsule TAKE 1 CAPSULE BY MOUTH EVERY DAY  ? omeprazole (PRILOSEC) 40 MG capsule Take 1 capsule (40 mg total) by mouth daily.  ? Polyethyl Glycol-Propyl Glycol (SYSTANE OP) Apply 1 drop to eye 4 (four) times daily as needed.  ? SHINGRIX injection   ? TURMERIC PO Take 1 tablet by mouth daily.  ? ?No facility-administered medications prior to visit.  ? ? ?Review of Systems ? ?{Labs  Heme  Chem  Endocrine  Serology  Results Review (optional):23779} ?  Objective  ?  ?There were no vitals taken for this visit. ?{Show  previous vital signs (optional):23777} ? ?Physical Exam  ?*** ? ?No results found for any visits on 05/21/21. ? Assessment & Plan  ?  ? ?*** ? ?No follow-ups on file.  ?   ? ?{provider attestation***:1} ? ? ?Lavon Paganini, MD  ?[facility] ?[phone] (phone) ?[phone] (fax) ? ?[facility] ?

## 2021-05-21 ENCOUNTER — Ambulatory Visit: Payer: Medicare HMO | Admitting: Family Medicine

## 2021-05-21 DIAGNOSIS — G4700 Insomnia, unspecified: Secondary | ICD-10-CM

## 2021-05-21 DIAGNOSIS — I10 Essential (primary) hypertension: Secondary | ICD-10-CM

## 2021-05-21 DIAGNOSIS — K219 Gastro-esophageal reflux disease without esophagitis: Secondary | ICD-10-CM

## 2021-05-22 NOTE — Progress Notes (Deleted)
?  ? ? ?Established patient visit ? ? ?Patient: Brandy Wong   DOB: [birth year]   87 y.o. Female  MRN: [id number] ?Visit Date: 05/23/2021 ? ?Today's healthcare provider: Lavon Paganini, MD  ? ?No chief complaint on file. ? ?Subjective  ?  ?HPI  ?Hypertension, follow-up ? ?BP Readings from Last 3 Encounters:  ?03/21/21 (!) 151/66  ?02/06/21 130/80  ?11/15/20 (!) 151/59  ? Wt Readings from Last 3 Encounters:  ?02/06/21 122 lb 6 oz (55.5 kg)  ?11/15/20 119 lb (54 kg)  ?02/06/20 122 lb (55.3 kg)  ?  ? ?She was last seen for hypertension 6 months ago.  ?BP at that visit was 151/59. Management since that visit includes no changes. ? ?She reports {excellent/good/fair/poor:19665} compliance with treatment. ?She {is/is not:9024} having side effects. {document side effects if present:1} ?She is following a {diet:21022986} diet. ?She {is/is not:9024} exercising. ?She does not smoke. ? ?Use of agents associated with hypertension: none.  ? ?Outside blood pressures are {***enter patient reported home BP readings, or 'not being checked':1}. ?Symptoms: ?{Yes/No:20286} chest pain {Yes/No:20286} chest pressure  ?{Yes/No:20286} palpitations {Yes/No:20286} syncope  ?{Yes/No:20286} dyspnea {Yes/No:20286} orthopnea  ?{Yes/No:20286} paroxysmal nocturnal dyspnea {Yes/No:20286} lower extremity edema  ? ?Pertinent labs: ?Lab Results  ?Component Value Date  ? CHOL 304 (H) 11/15/2020  ? HDL 106 11/15/2020  ? LDLCALC 186 (H) 11/15/2020  ? TRIG 77 11/15/2020  ? CHOLHDL 2.9 11/15/2020  ? Lab Results  ?Component Value Date  ? NA 134 12/03/2020  ? K 4.1 12/03/2020  ? CREATININE 0.62 12/03/2020  ? EGFR 88 12/03/2020  ? GLUCOSE 95 12/03/2020  ?  ? ?The ASCVD Risk score (Arnett DK, et al., 2019) failed to calculate for the following reasons: ?  The 2019 ASCVD risk score is only valid for ages 48 to 18  ? ?--------------------------------------------------------------------------------------------------- ?Follow up for insomnia ? ?The patient was  last seen for this 6 months ago. ?Changes made at last visit include D/C tylenol pm and flexeril. Start Doxepin prn ? ?She reports {excellent/good/fair/poor:19665} compliance with treatment. ?She feels that condition is {improved/worse/unchanged:3041574}. ?She {is/is not:21021397} having side effects. *** ? ?----------------------------------------------------------------------------------------- ? ? ?Medications: ?Outpatient Medications Prior to Visit  ?Medication Sig  ? Cholecalciferol 25 MCG (1000 UT) tablet Take 1 tablet by mouth daily.  ? Cranberry 125 MG TABS Take 1 tablet by mouth daily.  ? hydrochlorothiazide (HYDRODIURIL) 25 MG tablet TAKE 1 TABLET BY MOUTH EVERY DAY  ? MAGNESIUM PO Take 1 capsule by mouth daily.  ? omeprazole (PRILOSEC) 20 MG capsule TAKE 1 CAPSULE BY MOUTH EVERY DAY  ? omeprazole (PRILOSEC) 40 MG capsule Take 1 capsule (40 mg total) by mouth daily.  ? Polyethyl Glycol-Propyl Glycol (SYSTANE OP) Apply 1 drop to eye 4 (four) times daily as needed.  ? SHINGRIX injection   ? TURMERIC PO Take 1 tablet by mouth daily.  ? ?No facility-administered medications prior to visit.  ? ? ?Review of Systems ? ?  ?  Objective  ?  ?There were no vitals taken for this visit. ?BP Readings from Last 3 Encounters:  ?03/21/21 (!) 151/66  ?02/06/21 130/80  ?11/15/20 (!) 151/59  ? ?Wt Readings from Last 3 Encounters:  ?02/06/21 122 lb 6 oz (55.5 kg)  ?11/15/20 119 lb (54 kg)  ?02/06/20 122 lb (55.3 kg)  ? ?  ? ?Physical Exam  ?*** ? ?No results found for any visits on 05/23/21. ? Assessment & Plan  ?  ? ?*** ? ?No follow-ups on file.  ?   ? ?{  provider attestation***:1} ? ? ?Lavon Paganini, MD  ?[facility] ?[phone] (phone) ?[phone] (fax) ? ?[facility] ?

## 2021-05-23 ENCOUNTER — Ambulatory Visit: Payer: Medicare HMO | Admitting: Family Medicine

## 2021-05-23 DIAGNOSIS — G4700 Insomnia, unspecified: Secondary | ICD-10-CM

## 2021-05-23 DIAGNOSIS — I10 Essential (primary) hypertension: Secondary | ICD-10-CM

## 2021-05-24 NOTE — Progress Notes (Signed)
?  ? ?I,Sulibeya S Dimas,acting as a scribe for Lavon Paganini, MD.,have documented all relevant documentation on the behalf of Lavon Paganini, MD,as directed by  Lavon Paganini, MD while in the presence of Lavon Paganini, MD. ? ? ?Established patient visit ? ? ?Patient: Brandy Wong   DOB: 1935/02/19   87 y.o. Female  MRN: [id number] ?Visit Date: 05/27/2021 ? ?Today's healthcare provider: Lavon Paganini, MD  ? ?Chief Complaint  ?Patient presents with  ? Hypertension  ? ?Subjective  ?  ?HPI  ?Hypertension, follow-up ? ?BP Readings from Last 3 Encounters:  ?05/27/21 (!) 165/92  ?03/21/21 (!) 151/66  ?02/06/21 130/80  ? Wt Readings from Last 3 Encounters:  ?05/27/21 122 lb 8 oz (55.6 kg)  ?02/06/21 122 lb 6 oz (55.5 kg)  ?11/15/20 119 lb (54 kg)  ?  ? ?She was last seen for hypertension 6 months ago.  ?BP at that visit was 151/59. Management since that visit includes no changes. ? ?She reports excellent compliance with treatment. ?She is not having side effects.  ?She is following a Regular diet. ?She is exercising. ?She does not smoke. ? ?Use of agents associated with hypertension: none.  ? ?Outside blood pressures are stable. ?Symptoms: ?No chest pain No chest pressure  ?No palpitations No syncope  ?No dyspnea No orthopnea  ?No paroxysmal nocturnal dyspnea No lower extremity edema  ? ?Pertinent labs: ?Lab Results  ?Component Value Date  ? CHOL 304 (H) 11/15/2020  ? HDL 106 11/15/2020  ? LDLCALC 186 (H) 11/15/2020  ? TRIG 77 11/15/2020  ? CHOLHDL 2.9 11/15/2020  ? Lab Results  ?Component Value Date  ? NA 134 12/03/2020  ? K 4.1 12/03/2020  ? CREATININE 0.62 12/03/2020  ? EGFR 88 12/03/2020  ? GLUCOSE 95 12/03/2020  ?  ? ?The ASCVD Risk score (Arnett DK, et al., 2019) failed to calculate for the following reasons: ?  The 2019 ASCVD risk score is only valid for ages 43 to 32   ? ?--------------------------------------------------------------------------------------------------- ? ? ?Medications: ?Outpatient Medications Prior to Visit  ?Medication Sig  ? Cholecalciferol 25 MCG (1000 UT) tablet Take 1 tablet by mouth daily.  ? MAGNESIUM PO Take 1 capsule by mouth daily.  ? omeprazole (PRILOSEC) 40 MG capsule Take 1 capsule (40 mg total) by mouth daily.  ? Polyethyl Glycol-Propyl Glycol (SYSTANE OP) Apply 1 drop to eye 4 (four) times daily as needed.  ? TURMERIC PO Take 1 tablet by mouth daily.  ? [DISCONTINUED] hydrochlorothiazide (HYDRODIURIL) 25 MG tablet TAKE 1 TABLET BY MOUTH EVERY DAY  ? [DISCONTINUED] Cranberry 125 MG TABS Take 1 tablet by mouth daily.  ? [DISCONTINUED] omeprazole (PRILOSEC) 20 MG capsule TAKE 1 CAPSULE BY MOUTH EVERY DAY (Patient not taking: Reported on 05/27/2021)  ? [DISCONTINUED] SHINGRIX injection  (Patient not taking: Reported on 05/27/2021)  ? ?No facility-administered medications prior to visit.  ? ? ?Review of Systems  ?Constitutional:  Negative for appetite change and fatigue.  ?Respiratory:  Negative for chest tightness and shortness of breath.   ?Cardiovascular:  Negative for chest pain and palpitations.  ? ?  ?  Objective  ?  ?BP (!) 165/92 (BP Location: Left Arm, Patient Position: Sitting, Cuff Size: Normal)   Pulse 88   Temp 98.2 ?F (36.8 ?C) (Temporal)   Resp 16   Wt 122 lb 8 oz (55.6 kg)   BMI 23.15 kg/m?  ?BP Readings from Last 3 Encounters:  ?05/27/21 (!) 165/92  ?03/21/21 (!) 151/66  ?02/06/21 130/80  ? ?  Wt Readings from Last 3 Encounters:  ?05/27/21 122 lb 8 oz (55.6 kg)  ?02/06/21 122 lb 6 oz (55.5 kg)  ?11/15/20 119 lb (54 kg)  ? ?  ? ?Physical Exam ?Vitals reviewed.  ?Constitutional:   ?   General: She is not in acute distress. ?   Appearance: Normal appearance. She is well-developed. She is not diaphoretic.  ?HENT:  ?   Head: Normocephalic and atraumatic.  ?Eyes:  ?   General: No scleral icterus. ?   Conjunctiva/sclera: Conjunctivae  normal.  ?Neck:  ?   Thyroid: No thyromegaly.  ?Cardiovascular:  ?   Rate and Rhythm: Normal rate and regular rhythm.  ?   Pulses: Normal pulses.  ?   Heart sounds: Normal heart sounds. No murmur heard. ?Pulmonary:  ?   Effort: Pulmonary effort is normal. No respiratory distress.  ?   Breath sounds: Normal breath sounds. No wheezing, rhonchi or rales.  ?Musculoskeletal:  ?   Cervical back: Neck supple.  ?   Right lower leg: No edema.  ?   Left lower leg: No edema.  ?Lymphadenopathy:  ?   Cervical: No cervical adenopathy.  ?Skin: ?   General: Skin is warm and dry.  ?Neurological:  ?   Mental Status: She is alert and oriented to person, place, and time. Mental status is at baseline.  ?Psychiatric:     ?   Mood and Affect: Mood normal.     ?   Behavior: Behavior normal.  ?  ? ? ?No results found for any visits on 05/27/21. ? Assessment & Plan  ?  ? ?Problem List Items Addressed This Visit   ? ?  ? Cardiovascular and Mediastinum  ? HTN (hypertension) - Primary  ?  Chronic and uncontrolled ?Continue HCTZ at current dose ?Add losartan 50 mg daily ?Recheck metabolic panel ?F/u in 2 months  ?  ?  ? Relevant Medications  ? hydrochlorothiazide (HYDRODIURIL) 25 MG tablet  ? losartan (COZAAR) 50 MG tablet  ? Other Relevant Orders  ? Basic Metabolic Panel (BMET)  ?  ? Digestive  ? GERD (gastroesophageal reflux disease)  ?  Chronic and well controlled ?Continue PPI ?  ?  ?  ? Other  ? Insomnia  ?  Chronic and uncontrolled ?tiral of trazdone $RemoveBef'100mg'[id number]$  qhs ?Failed lower dose, melatonin, and doxepin previously ?  ?  ? ?Other Visit Diagnoses   ? ? Essential hypertension      ? Relevant Medications  ? hydrochlorothiazide (HYDRODIURIL) 25 MG tablet  ? losartan (COZAAR) 50 MG tablet  ? ?  ?  ? ?Return in about 2 months (around 07/27/2021) for BP f/u, virtual ok.  ?   ? ?I, Lavon Paganini, MD, have reviewed all documentation for this visit. The documentation on 05/27/21 for the exam, diagnosis, procedures, and orders are all accurate and  complete. ? ? ?Virginia Crews, MD, MPH ?[place] ?[facility]   ?

## 2021-05-27 ENCOUNTER — Ambulatory Visit (INDEPENDENT_AMBULATORY_CARE_PROVIDER_SITE_OTHER): Payer: Medicare HMO | Admitting: Family Medicine

## 2021-05-27 ENCOUNTER — Other Ambulatory Visit: Payer: Self-pay

## 2021-05-27 ENCOUNTER — Encounter: Payer: Self-pay | Admitting: Family Medicine

## 2021-05-27 VITALS — BP 165/92 | HR 88 | Temp 98.2°F | Resp 16 | Wt 122.5 lb

## 2021-05-27 DIAGNOSIS — K219 Gastro-esophageal reflux disease without esophagitis: Secondary | ICD-10-CM | POA: Diagnosis not present

## 2021-05-27 DIAGNOSIS — G4700 Insomnia, unspecified: Secondary | ICD-10-CM

## 2021-05-27 DIAGNOSIS — I10 Essential (primary) hypertension: Secondary | ICD-10-CM

## 2021-05-27 MED ORDER — TRAZODONE HCL 100 MG PO TABS: 100.0000 mg | ORAL_TABLET | Freq: Every evening | ORAL | 1 refills | Status: DC | PRN

## 2021-05-27 MED ORDER — LOSARTAN POTASSIUM 50 MG PO TABS: 50.0000 mg | ORAL_TABLET | Freq: Every day | ORAL | 1 refills | Status: DC

## 2021-05-27 MED ORDER — HYDROCHLOROTHIAZIDE 25 MG PO TABS: 25.0000 mg | ORAL_TABLET | Freq: Every day | ORAL | 1 refills | Status: DC

## 2021-05-27 NOTE — Patient Instructions (Signed)
Continue HCTZ '25mg'$  daily ?Add losartan '50mg'$  daily ? ?Try higher dose of Trazodone '100mg'$  nightly - can try to take a little earlier before bedtime if needed ?

## 2021-05-27 NOTE — Assessment & Plan Note (Signed)
Chronic and uncontrolled ?Continue HCTZ at current dose ?Add losartan 50 mg daily ?Recheck metabolic panel ?F/u in 2 months  ?

## 2021-05-27 NOTE — Assessment & Plan Note (Signed)
Chronic and uncontrolled ?tiral of trazdone '100mg'$  qhs ?Failed lower dose, melatonin, and doxepin previously ?

## 2021-05-27 NOTE — Assessment & Plan Note (Signed)
Chronic and well controlled  Continue PPI 

## 2021-05-28 ENCOUNTER — Other Ambulatory Visit: Payer: Self-pay | Admitting: Family Medicine

## 2021-05-28 LAB — BASIC METABOLIC PANEL
BUN/Creatinine Ratio: 17 (ref 12–28)
BUN: 11 mg/dL (ref 8–27)
CO2: 24 mmol/L (ref 20–29)
Calcium: 10.4 mg/dL — ABNORMAL HIGH (ref 8.7–10.3)
Chloride: 94 mmol/L — ABNORMAL LOW (ref 96–106)
Creatinine, Ser: 0.66 mg/dL (ref 0.57–1.00)
Glucose: 98 mg/dL (ref 70–99)
Potassium: 3.9 mmol/L (ref 3.5–5.2)
Sodium: 133 mmol/L — ABNORMAL LOW (ref 134–144)
eGFR: 86 mL/min/{1.73_m2} (ref >59)

## 2021-05-31 ENCOUNTER — Telehealth: Payer: Self-pay | Admitting: Family Medicine

## 2021-05-31 NOTE — Telephone Encounter (Signed)
Pt is calling back to see if her lab results are available. ?Please advise [phone] ?

## 2021-06-03 NOTE — Telephone Encounter (Signed)
Please review and advise. KW 

## 2021-06-03 NOTE — Telephone Encounter (Signed)
Results message from provider given to pt. Pt wants to let doctor know that the '100mg'$  Trazodone had side effects, she waited 3 days and took 1/2 tablet and it did not help and still had symptoms next day, just states did not not feel right. Also SBP running in the 140's. ?

## 2021-06-03 NOTE — Telephone Encounter (Signed)
Patient advised as below.  

## 2021-06-03 NOTE — Telephone Encounter (Signed)
Lmtcb. Please advise as below.  

## 2021-06-03 NOTE — Telephone Encounter (Signed)
Ok to d/c trazodone if not helping.  Losartan will take a few weeks to show improvement in BP, so will continue until f/u visit. ?

## 2021-06-03 NOTE — Telephone Encounter (Signed)
I was waiting for all of the results to come back. One is still pending. The other 3 are normal though, so no need to worry about the calcium elevation on original labs ?

## 2021-06-09 LAB — CALCIUM, IONIZED: Calcium, Ion: 5.3 mg/dL (ref 4.5–5.6)

## 2021-06-09 LAB — PARATHYROID HORMONE, INTACT (NO CA): PTH: 17 pg/mL (ref 15–65)

## 2021-06-09 LAB — VITAMIN D 1,25 DIHYDROXY
Vitamin D 1, 25 (OH)2 Total: 58 pg/mL
Vitamin D2 1, 25 (OH)2: <10 pg/mL
Vitamin D3 1, 25 (OH)2: 57 pg/mL

## 2021-06-09 LAB — VITAMIN D 25 HYDROXY (VIT D DEFICIENCY, FRACTURES): Vit D, 25-Hydroxy: 88.6 ng/mL (ref 30.0–100.0)

## 2021-07-24 NOTE — Progress Notes (Signed)
I,Brandy Wong,acting as a scribe for Lavon Paganini, MD.,have documented all relevant documentation on the behalf of Lavon Paganini, MD,as directed by  Lavon Paganini, MD while in the presence of Lavon Paganini, MD.   Established patient visit   Patient: Brandy Wong   DOB: [birth year]   87 y.o. Female  MRN: [id number] Visit Date: 07/29/2021  Today's healthcare provider: Lavon Paganini, MD   Chief Complaint  Patient presents with   Hypertension   Subjective    HPI  Hypertension, follow-up  BP Readings from Last 3 Encounters:  07/29/21 136/80  05/27/21 (!) 165/92  03/21/21 (!) 151/66   Wt Readings from Last 3 Encounters:  07/29/21 121 lb 14.4 oz (55.3 kg)  05/27/21 122 lb 8 oz (55.6 kg)  02/06/21 122 lb 6 oz (55.5 kg)     She was last seen for hypertension 2 months ago.  BP at that visit was 165/92. Management since that visit includes Add Lodartan 38m daily.  She reports excellent compliance with treatment. She is not having side effects.  She is following a Regular diet. She is exercising. She does not smoke.  Use of agents associated with hypertension: none.   Outside blood pressures are stable before yesterday. 120s Symptoms: No chest pain No chest pressure  No palpitations No syncope  No dyspnea No orthopnea  No paroxysmal nocturnal dyspnea No lower extremity edema   Pertinent labs Lab Results  Component Value Date   CHOL 304 (H) 11/15/2020   HDL 106 11/15/2020   LDLCALC 186 (H) 11/15/2020   TRIG 77 11/15/2020   CHOLHDL 2.9 11/15/2020   Lab Results  Component Value Date   NA 133 (L) 05/27/2021   K 3.9 05/27/2021   CREATININE 0.66 05/27/2021   EGFR 86 05/27/2021   GLUCOSE 98 05/27/2021     The ASCVD Risk score (Arnett DK, et al., 2019) failed to calculate for the following reasons:   The 2019 ASCVD risk score is only valid for ages 464to  772 ---------------------------------------------------------------------------------------------------   Medications: Outpatient Medications Prior to Visit  Medication Sig   Cholecalciferol 25 MCG (1000 UT) tablet Take 1 tablet by mouth daily.   MAGNESIUM PO Take 1 capsule by mouth daily.   Polyethyl Glycol-Propyl Glycol (SYSTANE OP) Apply 1 drop to eye 4 (four) times daily as needed.   TURMERIC PO Take 1 tablet by mouth daily.   [DISCONTINUED] hydrochlorothiazide (HYDRODIURIL) 25 MG tablet Take 1 tablet (25 mg total) by mouth daily.   [DISCONTINUED] losartan (COZAAR) 50 MG tablet Take 1 tablet (50 mg total) by mouth daily.   [DISCONTINUED] omeprazole (PRILOSEC) 40 MG capsule Take 1 capsule (40 mg total) by mouth daily.   [DISCONTINUED] traZODone (DESYREL) 100 MG tablet Take 1 tablet (100 mg total) by mouth at bedtime as needed for sleep.   No facility-administered medications prior to visit.    Review of Systems  Constitutional:  Negative for appetite change and fatigue.  Eyes:  Negative for visual disturbance.  Respiratory:  Negative for chest tightness and shortness of breath.   Cardiovascular:  Negative for chest pain, palpitations and leg swelling.  Neurological:  Negative for headaches.       Objective    BP 136/80 (BP Location: Left Arm, Patient Position: Sitting, Cuff Size: Normal)   Pulse 86   Temp 98.3 F (36.8 C) (Oral)   Resp 16   Wt 121 lb 14.4 oz (55.3 kg)   SpO2 99%   BMI  23.03 kg/m  BP Readings from Last 3 Encounters:  07/29/21 136/80  05/27/21 (!) 165/92  03/21/21 (!) 151/66   Wt Readings from Last 3 Encounters:  07/29/21 121 lb 14.4 oz (55.3 kg)  05/27/21 122 lb 8 oz (55.6 kg)  02/06/21 122 lb 6 oz (55.5 kg)      Physical Exam Vitals reviewed.  Constitutional:      General: She is not in acute distress.    Appearance: Normal appearance. She is well-developed. She is not diaphoretic.  HENT:     Head: Normocephalic and atraumatic.  Eyes:      General: No scleral icterus.    Conjunctiva/sclera: Conjunctivae normal.  Neck:     Thyroid: No thyromegaly.  Cardiovascular:     Rate and Rhythm: Normal rate and regular rhythm.     Pulses: Normal pulses.     Heart sounds: Normal heart sounds. No murmur heard. Pulmonary:     Effort: Pulmonary effort is normal. No respiratory distress.     Breath sounds: Normal breath sounds. No wheezing, rhonchi or rales.  Musculoskeletal:     Cervical back: Neck supple.     Right lower leg: No edema.     Left lower leg: No edema.  Lymphadenopathy:     Cervical: No cervical adenopathy.  Skin:    General: Skin is warm and dry.     Findings: No rash.  Neurological:     Mental Status: She is alert and oriented to person, place, and time. Mental status is at baseline.  Psychiatric:        Mood and Affect: Mood normal.        Behavior: Behavior normal.      No results found for any visits on 07/29/21.  Assessment & Plan     Problem List Items Addressed This Visit       Cardiovascular and Mediastinum   Essential hypertension - Primary    Well controlled Continue current medications Recheck metabolic panel F/u in 6 months       Relevant Medications   hydrochlorothiazide (HYDRODIURIL) 25 MG tablet   losartan (COZAAR) 50 MG tablet   Other Relevant Orders   Basic Metabolic Panel (BMET)     Other   History of breast cancer    Continue annual mammos Would not recommend HRT - discussed in depth with patient       Insomnia    Chronic and uncontrolled Tried and failed trazodone, nortriptyline, gabapoentin (not sure what dose thogh, consider highre dose in the future)       Vasomotor symptoms due to menopause    Longstanding since menopause Discussed risks of HRT I nthe setting of h/o breast cancer She will try ashwagandha         Return in about 4 months (around 11/29/2021) for CPE, AWV.      I, Lavon Paganini, MD, have reviewed all documentation for this visit. The  documentation on 07/29/21 for the exam, diagnosis, procedures, and orders are all accurate and complete.   Travas Schexnayder, Dionne Bucy, MD, MPH [facility]

## 2021-07-29 ENCOUNTER — Ambulatory Visit (INDEPENDENT_AMBULATORY_CARE_PROVIDER_SITE_OTHER): Payer: Medicare HMO | Admitting: Family Medicine

## 2021-07-29 ENCOUNTER — Encounter: Payer: Self-pay | Admitting: Family Medicine

## 2021-07-29 VITALS — BP 136/80 | HR 86 | Temp 98.3°F | Resp 16 | Wt 121.9 lb

## 2021-07-29 DIAGNOSIS — N951 Menopausal and female climacteric states: Secondary | ICD-10-CM | POA: Insufficient documentation

## 2021-07-29 DIAGNOSIS — Z853 Personal history of malignant neoplasm of breast: Secondary | ICD-10-CM

## 2021-07-29 DIAGNOSIS — I10 Essential (primary) hypertension: Secondary | ICD-10-CM

## 2021-07-29 DIAGNOSIS — G4700 Insomnia, unspecified: Secondary | ICD-10-CM | POA: Diagnosis not present

## 2021-07-29 MED ORDER — OMEPRAZOLE 40 MG PO CPDR: 40.0000 mg | DELAYED_RELEASE_CAPSULE | Freq: Every day | ORAL | 4 refills | Status: DC

## 2021-07-29 MED ORDER — LOSARTAN POTASSIUM 50 MG PO TABS: 50.0000 mg | ORAL_TABLET | Freq: Every day | ORAL | 1 refills | Status: DC

## 2021-07-29 MED ORDER — HYDROCHLOROTHIAZIDE 25 MG PO TABS: 25.0000 mg | ORAL_TABLET | Freq: Every day | ORAL | 1 refills | Status: DC

## 2021-07-29 NOTE — Assessment & Plan Note (Signed)
Continue annual mammos Would not recommend HRT - discussed in depth with patient

## 2021-07-29 NOTE — Assessment & Plan Note (Signed)
Longstanding since menopause Discussed risks of HRT I nthe setting of h/o breast cancer She will try ashwagandha

## 2021-07-29 NOTE — Assessment & Plan Note (Signed)
Chronic and uncontrolled Tried and failed trazodone, nortriptyline, gabapoentin (not sure what dose thogh, consider highre dose in the future)

## 2021-07-29 NOTE — Assessment & Plan Note (Signed)
Well controlled Continue current medications Recheck metabolic panel F/u in 6 months  

## 2021-07-30 LAB — BASIC METABOLIC PANEL
BUN/Creatinine Ratio: 21 (ref 12–28)
BUN: 13 mg/dL (ref 8–27)
CO2: 24 mmol/L (ref 20–29)
Calcium: 10.1 mg/dL (ref 8.7–10.3)
Chloride: 94 mmol/L — ABNORMAL LOW (ref 96–106)
Creatinine, Ser: 0.63 mg/dL (ref 0.57–1.00)
Glucose: 85 mg/dL (ref 70–99)
Potassium: 4.2 mmol/L (ref 3.5–5.2)
Sodium: 133 mmol/L — ABNORMAL LOW (ref 134–144)
eGFR: 87 mL/min/{1.73_m2} (ref >59)

## 2021-07-31 DIAGNOSIS — Z008 Encounter for other general examination: Secondary | ICD-10-CM | POA: Diagnosis not present

## 2021-07-31 DIAGNOSIS — K219 Gastro-esophageal reflux disease without esophagitis: Secondary | ICD-10-CM | POA: Diagnosis not present

## 2021-07-31 DIAGNOSIS — Z8249 Family history of ischemic heart disease and other diseases of the circulatory system: Secondary | ICD-10-CM | POA: Diagnosis not present

## 2021-07-31 DIAGNOSIS — M1990 Unspecified osteoarthritis, unspecified site: Secondary | ICD-10-CM | POA: Diagnosis not present

## 2021-07-31 DIAGNOSIS — Z809 Family history of malignant neoplasm, unspecified: Secondary | ICD-10-CM | POA: Diagnosis not present

## 2021-07-31 DIAGNOSIS — Z87891 Personal history of nicotine dependence: Secondary | ICD-10-CM | POA: Diagnosis not present

## 2021-07-31 DIAGNOSIS — Z9181 History of falling: Secondary | ICD-10-CM | POA: Diagnosis not present

## 2021-07-31 DIAGNOSIS — Z853 Personal history of malignant neoplasm of breast: Secondary | ICD-10-CM | POA: Diagnosis not present

## 2021-07-31 DIAGNOSIS — I10 Essential (primary) hypertension: Secondary | ICD-10-CM | POA: Diagnosis not present

## 2021-09-09 ENCOUNTER — Ambulatory Visit (INDEPENDENT_AMBULATORY_CARE_PROVIDER_SITE_OTHER): Payer: Medicare HMO | Admitting: Family Medicine

## 2021-09-09 ENCOUNTER — Ambulatory Visit: Payer: Medicare HMO | Admitting: Family Medicine

## 2021-09-09 ENCOUNTER — Ambulatory Visit: Payer: Self-pay

## 2021-09-09 ENCOUNTER — Encounter: Payer: Self-pay | Admitting: Family Medicine

## 2021-09-09 VITALS — BP 130/59 | HR 96 | Temp 98.3°F | Resp 16 | Ht 61.0 in | Wt 120.0 lb

## 2021-09-09 DIAGNOSIS — Z85828 Personal history of other malignant neoplasm of skin: Secondary | ICD-10-CM | POA: Diagnosis not present

## 2021-09-09 DIAGNOSIS — D485 Neoplasm of uncertain behavior of skin: Secondary | ICD-10-CM | POA: Diagnosis not present

## 2021-09-09 DIAGNOSIS — X32XXXA Exposure to sunlight, initial encounter: Secondary | ICD-10-CM | POA: Diagnosis not present

## 2021-09-09 DIAGNOSIS — R399 Unspecified symptoms and signs involving the genitourinary system: Secondary | ICD-10-CM | POA: Diagnosis not present

## 2021-09-09 DIAGNOSIS — D2261 Melanocytic nevi of right upper limb, including shoulder: Secondary | ICD-10-CM | POA: Diagnosis not present

## 2021-09-09 DIAGNOSIS — D2262 Melanocytic nevi of left upper limb, including shoulder: Secondary | ICD-10-CM | POA: Diagnosis not present

## 2021-09-09 DIAGNOSIS — L570 Actinic keratosis: Secondary | ICD-10-CM | POA: Diagnosis not present

## 2021-09-09 DIAGNOSIS — C44722 Squamous cell carcinoma of skin of right lower limb, including hip: Secondary | ICD-10-CM | POA: Diagnosis not present

## 2021-09-09 DIAGNOSIS — D225 Melanocytic nevi of trunk: Secondary | ICD-10-CM | POA: Diagnosis not present

## 2021-09-09 LAB — POCT URINALYSIS DIPSTICK
Bilirubin, UA: NEGATIVE
Blood, UA: POSITIVE
Glucose, UA: NEGATIVE
Ketones, UA: NEGATIVE
Nitrite, UA: POSITIVE
Protein, UA: POSITIVE — AB
Spec Grav, UA: 1.01 (ref 1.010–1.025)
Urobilinogen, UA: 0.2 E.U./dL
pH, UA: 7 (ref 5.0–8.0)

## 2021-09-09 MED ORDER — CEPHALEXIN 500 MG PO CAPS: 500.0000 mg | ORAL_CAPSULE | Freq: Four times a day (QID) | ORAL | 0 refills | Status: DC

## 2021-09-09 MED ORDER — CEPHALEXIN 500 MG PO CAPS: 500.0000 mg | ORAL_CAPSULE | Freq: Four times a day (QID) | ORAL | 0 refills | Status: AC

## 2021-09-09 NOTE — Telephone Encounter (Signed)
Summary: possible UTI   pt called in with UTI symptoms, pt would like to know if she could drop off a urine sample to the office. Offered pt an apt for today. Pt says that she has another appointment at the same time as offered and is unable to accept appt time for today.   Pt would like further assistance. [phone]      Chief Complaint: UTI sx Symptoms: urinary urgency, frequency,burning, leakage Frequency: Friday night Pertinent Negatives: Patient denies fever, flank pain, blood in urine Disposition: '[]'$ ED /'[]'$ Urgent Care (no appt availability in office) / '[x]'$ Appointment(In office/virtual)/ '[]'$  [facility]/ '[]'$ Home Care/ '[]'$ Refused Recommended Disposition /'[]'$ [facility]/ '[]'$  Follow-up with PCP Additional Notes: Appt schedule today with Dr Ky Barban. Pt used Azo for pain for 2 days. Last dose yesterday.  Reason for Disposition  Urinating more frequently than usual (i.e., frequency)  Answer Assessment - Initial Assessment Questions 1. SYMPTOM: "What's the main symptom you're concerned about?" (e.g., frequency, incontinence)     Burning, urgency, bladder pressure and leakage 2. ONSET: "When did the  urgency  start?"     Since late Friday night 3. PAIN: "Is there any pain?" If Yes, ask: "How bad is it?" (Scale: 1-10; mild, moderate, severe)     moderate 4. CAUSE: "What do you think is causing the symptoms?"     UTI 5. OTHER SYMPTOMS: "Do you have any other symptoms?" (e.g., fever, flank pain, blood in urine, pain with urination)     burning  Protocols used: Urinary Symptoms-A-AH

## 2021-09-09 NOTE — Progress Notes (Signed)
   SUBJECTIVE:   CHIEF COMPLAINT / HPI:   URINARY SYMPTOMS  Dysuria: no Urinary frequency: yes Urgency: yes Urinary incontinence: yes Hematuria: no Back pain: no Suprapubic pain/pressure: yes Flank pain: no Fever:  no Vomiting: no Relief with cranberry juice: hasn't tried Relief with pyridium: yes Status: better/worse/stable Previous urinary tract infection: yes Recurrent urinary tract infection: no Sexual activity: No sexually active/monogomous/practicing safe sex History of sexually transmitted disease: no Vaginal discharge: no Treatments attempted: pyridium and increasing fluids     OBJECTIVE:   BP (!) 130/59 (BP Location: Left Arm, Patient Position: Sitting, Cuff Size: Normal)   Pulse 96   Temp 98.3 F (36.8 C) (Oral)   Resp 16   Ht '5\' 1"'$  (1.549 m)   Wt 120 lb (54.4 kg)   SpO2 98%   BMI 22.67 kg/m   Gen: well appearing, in NAD Card: Reg rate Lungs: Comfortable WOB on RA Ext: WWP, no edema   ASSESSMENT/PLAN:   UTI symptoms UA consistent with infection. Rx keflex x5 days. Will send for culture and adjust as indicated.    Myles Gip, DO

## 2021-09-12 DIAGNOSIS — D2371 Other benign neoplasm of skin of right lower limb, including hip: Secondary | ICD-10-CM | POA: Diagnosis not present

## 2021-09-12 DIAGNOSIS — C44722 Squamous cell carcinoma of skin of right lower limb, including hip: Secondary | ICD-10-CM | POA: Diagnosis not present

## 2021-10-31 DIAGNOSIS — R300 Dysuria: Secondary | ICD-10-CM | POA: Diagnosis not present

## 2021-10-31 DIAGNOSIS — N390 Urinary tract infection, site not specified: Secondary | ICD-10-CM | POA: Diagnosis not present

## 2021-11-06 DIAGNOSIS — M9901 Segmental and somatic dysfunction of cervical region: Secondary | ICD-10-CM | POA: Diagnosis not present

## 2021-11-06 DIAGNOSIS — M9902 Segmental and somatic dysfunction of thoracic region: Secondary | ICD-10-CM | POA: Diagnosis not present

## 2021-11-06 DIAGNOSIS — M47816 Spondylosis without myelopathy or radiculopathy, lumbar region: Secondary | ICD-10-CM | POA: Diagnosis not present

## 2021-11-06 DIAGNOSIS — M9903 Segmental and somatic dysfunction of lumbar region: Secondary | ICD-10-CM | POA: Diagnosis not present

## 2021-11-08 ENCOUNTER — Other Ambulatory Visit: Payer: Self-pay | Admitting: Family Medicine

## 2021-11-08 ENCOUNTER — Inpatient Hospital Stay
Admission: RE | Admit: 2021-11-08 | Discharge: 2021-11-08 | Disposition: A | Discharge status: Home / Self Care | Payer: Self-pay | Source: Ambulatory Visit | Attending: *Deleted | Admitting: *Deleted

## 2021-11-08 ENCOUNTER — Telehealth: Payer: Self-pay

## 2021-11-08 ENCOUNTER — Other Ambulatory Visit: Payer: Self-pay | Admitting: *Deleted

## 2021-11-08 DIAGNOSIS — Z1231 Encounter for screening mammogram for malignant neoplasm of breast: Secondary | ICD-10-CM

## 2021-11-08 DIAGNOSIS — Z853 Personal history of malignant neoplasm of breast: Secondary | ICD-10-CM

## 2021-11-08 NOTE — Telephone Encounter (Signed)
Copied from [place] [id number]. Topic: General - Other >> Nov 08, 2021  2:01 PM Everette C wrote: Reason for CRM: The patient has called to request for orders to be submitted for an annual mammogram at [facility]   Please contact further when possible

## 2021-11-08 NOTE — Telephone Encounter (Signed)
Spoke with patient.

## 2021-11-08 NOTE — Telephone Encounter (Signed)
Done

## 2021-11-13 NOTE — Progress Notes (Signed)
Spoke with patient.

## 2021-12-16 ENCOUNTER — Ambulatory Visit: Payer: Medicare HMO | Admitting: Urology

## 2021-12-16 VITALS — BP 144/87 | HR 87 | Ht 61.0 in | Wt 120.0 lb

## 2021-12-16 DIAGNOSIS — R350 Frequency of micturition: Secondary | ICD-10-CM | POA: Diagnosis not present

## 2021-12-16 DIAGNOSIS — N8110 Cystocele, unspecified: Secondary | ICD-10-CM

## 2021-12-16 DIAGNOSIS — Z87440 Personal history of urinary (tract) infections: Secondary | ICD-10-CM

## 2021-12-16 DIAGNOSIS — N390 Urinary tract infection, site not specified: Secondary | ICD-10-CM

## 2021-12-16 MED ORDER — TRIMETHOPRIM 100 MG PO TABS: 100.0000 mg | ORAL_TABLET | Freq: Every day | ORAL | 11 refills | Status: DC

## 2021-12-16 NOTE — Addendum Note (Signed)
Addended by: Evelina Bucy on: 12/16/2021 10:15 AM   Modules accepted: Orders

## 2021-12-16 NOTE — Progress Notes (Signed)
12/16/2021 9:42 AM   Brandy Wong April 09, 1934 [id number]  Referring provider: Virginia Crews, MD [address] [place],  [place] [address]  Chief Complaint  Patient presents with   Bladder Prolapse   Recurrent UTI    HPI: I was consulted to assess the patient's recurrent bladder infections.  She has had for this year.  She will get urgency and have difficulty to urinate  Normally she voids every 1-2 hours depending on fluid intake gets up 2-3 times a night.  She is continent.  Her bladder is more urgent when she is standing.  She has had prolapse managed with watchful waiting and used to wear a pessary for 12 years.  She is primarily concerned about her UTIs.  She can see prolapse but is not bothered by it  She has had a hysterectomy and likely a bladder suspension her prolapse surgery years ago.  Has difficulty urinating in public and relaxing.  Clinically not infected today  No neurologic issues.  Bowel movements normal   PMH: Past Medical History:  Diagnosis Date   Arthritis    Cancer ([facility]) 2013   R breast, s/p radiation and lumpectomy   GERD (gastroesophageal reflux disease)    Hypertension     Surgical History: Past Surgical History:  Procedure Laterality Date   APPENDECTOMY     BREAST SURGERY  2013   lumpectomy   CATARACT EXTRACTION     LAPAROSCOPIC VAGINAL HYSTERECTOMY WITH SALPINGO OOPHORECTOMY     TUBAL LIGATION      Home Medications:  Allergies as of 12/16/2021       Reactions   Amoxicillin Rash        Medication List        Accurate as of December 16, 2021  9:42 AM. If you have any questions, ask your nurse or doctor.          Cholecalciferol 25 MCG (1000 UT) tablet Take 1 tablet by mouth daily.   hydrochlorothiazide 25 MG tablet Commonly known as: HYDRODIURIL Take 1 tablet (25 mg total) by mouth daily.   losartan 50 MG tablet Commonly known as: COZAAR Take 1 tablet (50 mg total) by mouth daily.   MAGNESIUM  PO Take 1 capsule by mouth daily.   omeprazole 40 MG capsule Commonly known as: PRILOSEC Take 1 capsule (40 mg total) by mouth daily.   SYSTANE OP Apply 1 drop to eye 4 (four) times daily as needed.   TURMERIC PO Take 1 tablet by mouth daily.        Allergies:  Allergies  Allergen Reactions   Amoxicillin Rash    Family History: Family History  Problem Relation Age of Onset   GER disease Mother    Dementia Mother 25   Stroke Father    Heart attack Father    Colon cancer Neg Hx    Rectal cancer Neg Hx    Stomach cancer Neg Hx    Esophageal cancer Neg Hx     Social History:  reports that she quit smoking about 34 years ago. Her smoking use included cigarettes. She has a 1.50 pack-year smoking history. She has never used smokeless tobacco. She reports current alcohol use of about 7.0 standard drinks of alcohol per week. She reports that she does not use drugs.  ROS:  Physical Exam: BP (!) 144/87   Pulse 87   Ht '5\' 1"'$  (1.549 m)   Wt 54.4 kg   BMI 22.67 kg/m   Constitutional:  Alert and oriented, No acute distress.  Laboratory Data: Lab Results  Component Value Date   WBC 6.2 11/08/2019   HGB 14.5 11/08/2019   HCT 41.8 11/08/2019   MCV 94 11/08/2019   PLT 388 11/08/2019    Lab Results  Component Value Date   CREATININE 0.63 07/29/2021    No results found for: "PSA"  No results found for: "TESTOSTERONE"  No results found for: "HGBA1C"  Urinalysis    Component Value Date/Time   BILIRUBINUR negative 09/09/2021 1332   PROTEINUR Positive (A) 09/09/2021 1332   UROBILINOGEN 0.2 09/09/2021 1332   NITRITE positive 09/09/2021 1332   LEUKOCYTESUR Small (1+) (A) 09/09/2021 1332    Pertinent Imaging:   Assessment & Plan: Patient has recurrent urinary tract infections.  She has frequency that is quite fluid dependent.  She has prolapse well-tolerated.  Renal ultrasound ordered.   Pathophysiology of UTIs and prophylaxis discussed.  Come back on trimethoprim suppression therapy for cystoscopy.  Examine prolapse at that time.  Check a postvoid residual then .  Patient understands the prolapse likely not related to UTIs  1. Female bladder prolapse  - Urinalysis, Complete   No follow-ups on file.  Reece Packer, MD  [facility] [address], [place], [place] [address] [phone]

## 2021-12-18 NOTE — Progress Notes (Signed)
I,Sulibeya S Dimas,acting as a Education administrator for Lavon Paganini, MD.,have documented all relevant documentation on the behalf of Lavon Paganini, MD,as directed by  Lavon Paganini, MD while in the presence of Lavon Paganini, MD.    Annual Wellness Visit     Patient: Brandy Wong, Female    DOB: 10/21/1934, 87 y.o.   MRN: [id number] Visit Date: 12/19/2021  Today's Provider: Lavon Paganini, MD   Chief Complaint  Patient presents with   Medicare Wellness   Subjective    SHANETA CERVENKA is a 87 y.o. female who presents today for her Annual Wellness Visit. She reports consuming a general diet. Gym/ health club routine includes yoga. She generally feels fairly well. She reports sleeping poorly. She does not have additional problems to discuss today.   HPI   Medications: Outpatient Medications Prior to Visit  Medication Sig   Cholecalciferol 25 MCG (1000 UT) tablet Take 1 tablet by mouth daily.   hydrochlorothiazide (HYDRODIURIL) 25 MG tablet Take 1 tablet (25 mg total) by mouth daily.   losartan (COZAAR) 50 MG tablet Take 1 tablet (50 mg total) by mouth daily.   MAGNESIUM PO Take 1 capsule by mouth daily.   omeprazole (PRILOSEC) 40 MG capsule Take 1 capsule (40 mg total) by mouth daily.   Polyethyl Glycol-Propyl Glycol (SYSTANE OP) Apply 1 drop to eye 4 (four) times daily as needed.   trimethoprim (TRIMPEX) 100 MG tablet Take 1 tablet (100 mg total) by mouth daily.   TURMERIC PO Take 1 tablet by mouth daily.   No facility-administered medications prior to visit.    Allergies  Allergen Reactions   Amoxicillin Rash    Patient Care Team: Virginia Crews, MD as PCP - General (Family Medicine) Dasher, Rayvon Char, MD (Dermatology)  Review of Systems  Genitourinary:  Positive for difficulty urinating and frequency.  Psychiatric/Behavioral:  Positive for sleep disturbance.   All other systems reviewed and are negative.   Last CBC Lab Results  Component Value Date    WBC 6.2 11/08/2019   HGB 14.5 11/08/2019   HCT 41.8 11/08/2019   MCV 94 11/08/2019   MCH 32.6 11/08/2019   RDW 12.9 11/08/2019   PLT 388 61/68/3729   Last metabolic panel Lab Results  Component Value Date   GLUCOSE 85 07/29/2021   NA 133 (L) 07/29/2021   K 4.2 07/29/2021   CL 94 (L) 07/29/2021   CO2 24 07/29/2021   BUN 13 07/29/2021   CREATININE 0.63 07/29/2021   EGFR 87 07/29/2021   CALCIUM 10.1 07/29/2021   PROT 7.1 12/03/2020   ALBUMIN 4.6 12/03/2020   LABGLOB 2.5 12/03/2020   AGRATIO 1.8 12/03/2020   BILITOT 0.4 12/03/2020   ALKPHOS 73 12/03/2020   AST 23 12/03/2020   ALT 16 12/03/2020   Last lipids Lab Results  Component Value Date   CHOL 304 (H) 11/15/2020   HDL 106 11/15/2020   LDLCALC 186 (H) 11/15/2020   TRIG 77 11/15/2020   CHOLHDL 2.9 11/15/2020   Last vitamin D Lab Results  Component Value Date   VD25OH 88.6 05/29/2021      Objective    Vitals: BP 130/85 Comment: home reading  Pulse 79   Temp 98.2 F (36.8 C) (Oral)   Resp 16   Ht 5' (1.524 m)   Wt 119 lb 9.6 oz (54.3 kg)   BMI 23.36 kg/m  BP Readings from Last 3 Encounters:  12/19/21 130/85  12/16/21 (!) 144/87  09/09/21 (!) 130/59  Wt Readings from Last 3 Encounters:  12/19/21 119 lb 9.6 oz (54.3 kg)  12/16/21 120 lb (54.4 kg)  09/09/21 120 lb (54.4 kg)      Physical Exam Vitals reviewed.  Constitutional:      General: She is not in acute distress.    Appearance: Normal appearance. She is well-developed. She is not diaphoretic.  HENT:     Head: Normocephalic and atraumatic.     Right Ear: Tympanic membrane, ear canal and external ear normal.     Left Ear: Tympanic membrane, ear canal and external ear normal.     Nose: Nose normal.     Mouth/Throat:     Mouth: Mucous membranes are moist.     Pharynx: Oropharynx is clear. No oropharyngeal exudate.  Eyes:     General: No scleral icterus.    Conjunctiva/sclera: Conjunctivae normal.     Pupils: Pupils are equal, round,  and reactive to light.  Neck:     Thyroid: No thyromegaly.  Cardiovascular:     Rate and Rhythm: Normal rate and regular rhythm.     Pulses: Normal pulses.     Heart sounds: Normal heart sounds. No murmur heard. Pulmonary:     Effort: Pulmonary effort is normal. No respiratory distress.     Breath sounds: Normal breath sounds. No wheezing or rales.  Abdominal:     General: There is no distension.     Palpations: Abdomen is soft.     Tenderness: There is no abdominal tenderness.  Musculoskeletal:        General: No deformity.     Cervical back: Neck supple.     Right lower leg: No edema.     Left lower leg: No edema.  Lymphadenopathy:     Cervical: No cervical adenopathy.  Skin:    General: Skin is warm and dry.     Findings: No rash.  Neurological:     Mental Status: She is alert and oriented to person, place, and time. Mental status is at baseline.     Gait: Gait normal.  Psychiatric:        Mood and Affect: Mood normal.        Behavior: Behavior normal.        Thought Content: Thought content normal.      Most recent functional status assessment:    12/19/2021   10:50 AM  In your present state of health, do you have any difficulty performing the following activities:  Hearing? 0  Vision? 0  Difficulty concentrating or making decisions? 0  Walking or climbing stairs? 0  Dressing or bathing? 0  Doing errands, shopping? 0   Most recent fall risk assessment:    12/19/2021   10:50 AM  Fall Risk   Falls in the past year? 1  Number falls in past yr: 0  Injury with Fall? 0  Risk for fall due to : Impaired balance/gait  Follow up Falls evaluation completed;Education provided;Falls prevention discussed    Most recent depression screenings:    12/19/2021   10:50 AM 05/27/2021    1:59 PM  PHQ 2/9 Scores  PHQ - 2 Score 0 0  PHQ- 9 Score 4 3   Most recent cognitive screening:    11/08/2019    9:12 AM  6CIT Screen  What Year? 0 points  What month? 0 points   What time? 0 points  Count back from 20 0 points  Months in reverse 0 points  Repeat phrase 2 points  Total Score 2 points   Most recent Audit-C alcohol use screening    12/19/2021   10:51 AM  Alcohol Use Disorder Test (AUDIT)  1. How often do you have a drink containing alcohol? 1  2. How many drinks containing alcohol do you have on a typical day when you are drinking? 0  3. How often do you have six or more drinks on one occasion? 0  AUDIT-C Score 1   A score of 3 or more in women, and 4 or more in men indicates increased risk for alcohol abuse, EXCEPT if all of the points are from question 1   No results found for any visits on 12/19/21.  Assessment & Plan     Annual wellness visit done today including the all of the following: Reviewed patient's Family Medical History Reviewed and updated list of patient's medical providers Assessment of cognitive impairment was done Assessed patient's functional ability Established a written schedule for health screening [place] Completed and Reviewed  Exercise Activities and Dietary recommendations  Goals      DIET - INCREASE WATER INTAKE     Recommend increasing water intake to 3 glasses a day.      Prevent falls     Recommend to remove any items from the home that may cause slips or trips.        Immunization History  Administered Date(s) Administered   Fluad Quad(high Dose 65+) 11/08/2018, 11/17/2019, 11/15/2020, 12/19/2021   Influenza, High Dose Seasonal PF 10/31/2016, 11/11/2017   PFIZER(Purple Top)SARS-COV-2 Vaccination 03/25/2019, 04/15/2019, 02/29/2020   Pneumococcal Conjugate-13 11/22/2014   Pneumococcal Polysaccharide-23 01/19/2017   Td 01/19/2017   Zoster Recombinat (Shingrix) 12/10/2018, 05/13/2019    Health Maintenance  Topic Date Due   COVID-19 Vaccine (4 - Pfizer series) 04/25/2020   DEXA SCAN  01/02/2022   TETANUS/TDAP  01/20/2027   Pneumonia Vaccine 9+ Years old  Completed    INFLUENZA VACCINE  Completed   Zoster Vaccines- Shingrix  Completed   HPV VACCINES  Aged Out     Discussed health benefits of physical activity, and encouraged her to engage in regular exercise appropriate for her age and condition.    Problem List Items Addressed This Visit       Cardiovascular and Mediastinum   Essential hypertension    Well controlled on home readings Continue current meds Recheck metabolic panel      Relevant Orders   Comprehensive metabolic panel   Lipid Panel With LDL/HDL Ratio   Senile purpura ([place])     Other   Avitaminosis D   Relevant Orders   VITAMIN D 25 Hydroxy (Vit-D Deficiency, Fractures)   Other Visit Diagnoses     Encounter for subsequent annual wellness visit in Medicare patient    -  Primary   Relevant Orders   Comprehensive metabolic panel   Lipid Panel With LDL/HDL Ratio   VITAMIN D 25 Hydroxy (Vit-D Deficiency, Fractures)   Flu Vaccine QUAD High Dose(Fluad) (Completed)   Encounter for annual physical exam       Need for immunization against influenza       Relevant Orders   Flu Vaccine QUAD High Dose(Fluad) (Completed)   Postmenopausal       Relevant Orders   FSH/LH       Patient requests hormone levels related to her insomnia.  Return in about 6 months (around 06/20/2022) for chronic disease f/u.     I, Lavon Paganini, MD, have reviewed all documentation for  this visit. The documentation on 12/19/21 for the exam, diagnosis, procedures, and orders are all accurate and complete.   Finesse Fielder, Dionne Bucy, MD, MPH [facility]

## 2021-12-19 ENCOUNTER — Encounter: Payer: Self-pay | Admitting: Family Medicine

## 2021-12-19 ENCOUNTER — Ambulatory Visit (INDEPENDENT_AMBULATORY_CARE_PROVIDER_SITE_OTHER): Payer: Medicare HMO | Admitting: Family Medicine

## 2021-12-19 VITALS — BP 130/85 | HR 79 | Temp 98.2°F | Resp 16 | Ht 60.0 in | Wt 119.6 lb

## 2021-12-19 DIAGNOSIS — E559 Vitamin D deficiency, unspecified: Secondary | ICD-10-CM | POA: Diagnosis not present

## 2021-12-19 DIAGNOSIS — D692 Other nonthrombocytopenic purpura: Secondary | ICD-10-CM | POA: Insufficient documentation

## 2021-12-19 DIAGNOSIS — Z780 Asymptomatic menopausal state: Secondary | ICD-10-CM

## 2021-12-19 DIAGNOSIS — Z0000 Encounter for general adult medical examination without abnormal findings: Secondary | ICD-10-CM

## 2021-12-19 DIAGNOSIS — Z23 Encounter for immunization: Secondary | ICD-10-CM | POA: Diagnosis not present

## 2021-12-19 DIAGNOSIS — I10 Essential (primary) hypertension: Secondary | ICD-10-CM

## 2021-12-19 NOTE — Assessment & Plan Note (Signed)
Well controlled on home readings Continue current meds Recheck metabolic panel 

## 2021-12-20 LAB — COMPREHENSIVE METABOLIC PANEL
ALT: 15 IU/L (ref 0–32)
AST: 24 IU/L (ref 0–40)
Albumin/Globulin Ratio: 2.3 — ABNORMAL HIGH (ref 1.2–2.2)
Albumin: 5 g/dL — ABNORMAL HIGH (ref 3.7–4.7)
Alkaline Phosphatase: 92 IU/L (ref 44–121)
BUN/Creatinine Ratio: 12 (ref 12–28)
BUN: 9 mg/dL (ref 8–27)
Bilirubin Total: 0.4 mg/dL (ref 0.0–1.2)
CO2: 23 mmol/L (ref 20–29)
Calcium: 10.6 mg/dL — ABNORMAL HIGH (ref 8.7–10.3)
Chloride: 92 mmol/L — ABNORMAL LOW (ref 96–106)
Creatinine, Ser: 0.76 mg/dL (ref 0.57–1.00)
Globulin, Total: 2.2 g/dL (ref 1.5–4.5)
Glucose: 94 mg/dL (ref 70–99)
Potassium: 4.5 mmol/L (ref 3.5–5.2)
Sodium: 132 mmol/L — ABNORMAL LOW (ref 134–144)
Total Protein: 7.2 g/dL (ref 6.0–8.5)
eGFR: 77 mL/min/{1.73_m2} (ref >59)

## 2021-12-20 LAB — LIPID PANEL WITH LDL/HDL RATIO
Cholesterol, Total: 281 mg/dL — ABNORMAL HIGH (ref 100–199)
HDL: 106 mg/dL (ref >39)
LDL Chol Calc (NIH): 156 mg/dL — ABNORMAL HIGH (ref 0–99)
LDL/HDL Ratio: 1.5 ratio (ref 0.0–3.2)
Triglycerides: 115 mg/dL (ref 0–149)
VLDL Cholesterol Cal: 19 mg/dL (ref 5–40)

## 2021-12-20 LAB — VITAMIN D 25 HYDROXY (VIT D DEFICIENCY, FRACTURES): Vit D, 25-Hydroxy: 57 ng/mL (ref 30.0–100.0)

## 2021-12-20 LAB — FSH/LH
FSH: 88.7 m[IU]/mL
LH: 44 m[IU]/mL

## 2021-12-30 ENCOUNTER — Ambulatory Visit
Admission: RE | Admit: 2021-12-30 | Discharge: 2021-12-30 | Disposition: A | Discharge status: Home / Self Care | Payer: Medicare HMO | Source: Ambulatory Visit | Attending: Family Medicine | Admitting: Family Medicine

## 2021-12-30 DIAGNOSIS — Z853 Personal history of malignant neoplasm of breast: Secondary | ICD-10-CM | POA: Insufficient documentation

## 2021-12-30 DIAGNOSIS — Z1231 Encounter for screening mammogram for malignant neoplasm of breast: Secondary | ICD-10-CM | POA: Diagnosis not present

## 2022-01-01 NOTE — Progress Notes (Signed)
Hi Brandy Wong  Normal mammogram; repeat in 1 year.  Please let us know if you have any questions.  Thank you,  Tally Joe, FNP

## 2022-01-16 ENCOUNTER — Other Ambulatory Visit: Payer: Self-pay | Admitting: Family Medicine

## 2022-01-27 ENCOUNTER — Encounter: Payer: Self-pay | Admitting: Urology

## 2022-01-27 ENCOUNTER — Ambulatory Visit: Payer: Medicare HMO | Admitting: Urology

## 2022-01-27 VITALS — BP 143/83 | HR 90 | Ht 61.0 in | Wt 119.0 lb

## 2022-01-27 DIAGNOSIS — Z87440 Personal history of urinary (tract) infections: Secondary | ICD-10-CM | POA: Diagnosis not present

## 2022-01-27 DIAGNOSIS — N3020 Other chronic cystitis without hematuria: Secondary | ICD-10-CM

## 2022-01-27 MED ORDER — NITROFURANTOIN MACROCRYSTAL 100 MG PO CAPS: 100.0000 mg | ORAL_CAPSULE | Freq: Every day | ORAL | 11 refills | Status: DC

## 2022-01-27 NOTE — Addendum Note (Signed)
Addended by: Evelina Bucy on: 01/27/2022 11:16 AM   Modules accepted: Orders

## 2022-01-27 NOTE — Progress Notes (Signed)
01/27/2022 10:51 AM   Brandy Wong 06/24/1934 [id number]  Referring provider: Virginia Crews, [place] [place],  [place] [address]  Chief Complaint  Patient presents with   Cysto    HPI: I was consulted to assess the patient's recurrent bladder infections.  She has had for this year.  She will get urgency and have difficulty to urinate   Normally she voids every 1-2 hours depending on fluid intake gets up 2-3 times a night.  She is continent.  Her bladder is more urgent when she is standing.   She has had prolapse managed with watchful waiting and used to wear a pessary for 12 years.  She is primarily concerned about her UTIs.  She can see prolapse but is not bothered by it   She has had a hysterectomy and likely a bladder suspension her prolapse surgery years ago.   Has difficulty urinating in public and relaxing.  Clinically not infected today    Patient has recurrent urinary tract infections.  She has frequency that is quite fluid dependent.  She has prolapse well-tolerated.  Renal ultrasound ordered.  Pathophysiology of UTIs and prophylaxis discussed.  Come back on trimethoprim suppression therapy for cystoscopy.  Examine prolapse at that time.  Check a postvoid residual then .  Patient understands the prolapse likely not related to UTIs   Today Patient never had a renal ultrasound. Patient was infection free on the daily trimethoprim but after 30 days she stopped it because it was upsetting her stomach and this settle down.  Clinically not infected today  She had a grade 3 cystocele that exited the introitus.  Vaginal cuff distended from 8 cm to 5 cm.  She had a fixed bladder neck.  She had some vaginal shortening and no posterior defect  Cystoscopy: Patient underwent flexible cystoscopy.  Bladder mucosa and trigone were normal.  Urine was clear.  No cystitis or carcinoma   PMH: Past Medical History:  Diagnosis Date   Arthritis    Cancer ([place])  2013   R breast, s/p radiation and lumpectomy   GERD (gastroesophageal reflux disease)    Hypertension     Surgical History: Past Surgical History:  Procedure Laterality Date   APPENDECTOMY     BREAST EXCISIONAL BIOPSY Left    benign   BREAST EXCISIONAL BIOPSY Left    benign   BREAST SURGERY  2013   lumpectomy post positive breast ca, radiation   CATARACT EXTRACTION     LAPAROSCOPIC VAGINAL HYSTERECTOMY WITH SALPINGO OOPHORECTOMY     TUBAL LIGATION      Home Medications:  Allergies as of 01/27/2022       Reactions   Amoxicillin Rash        Medication List        Accurate as of January 27, 2022 10:51 AM. If you have any questions, ask your nurse or doctor.          Cholecalciferol 25 MCG (1000 UT) tablet Take 1 tablet by mouth daily.   hydrochlorothiazide 25 MG tablet Commonly known as: HYDRODIURIL Take 1 tablet (25 mg total) by mouth daily.   losartan 50 MG tablet Commonly known as: COZAAR TAKE 1 TABLET BY MOUTH EVERY DAY   MAGNESIUM PO Take 1 capsule by mouth daily.   omeprazole 40 MG capsule Commonly known as: PRILOSEC Take 1 capsule (40 mg total) by mouth daily.   SYSTANE OP Apply 1 drop to eye 4 (four) times daily as  needed.   trimethoprim 100 MG tablet Commonly known as: TRIMPEX Take 1 tablet (100 mg total) by mouth daily.   TURMERIC PO Take 1 tablet by mouth daily.        Allergies:  Allergies  Allergen Reactions   Amoxicillin Rash    Family History: Family History  Problem Relation Age of Onset   GER disease Mother    Dementia Mother 42   Stroke Father    Heart attack Father    Colon cancer Neg Hx    Rectal cancer Neg Hx    Stomach cancer Neg Hx    Esophageal cancer Neg Hx     Social History:  reports that she quit smoking about 34 years ago. Her smoking use included cigarettes. She has a 1.50 pack-year smoking history. She has never used smokeless tobacco. She reports current alcohol use of about 7.0 standard drinks  of alcohol per week. She reports that she does not use drugs.  ROS:                                        Physical Exam: There were no vitals taken for this visit.  Constitutional:  Alert and oriented, No acute distress. HEENT: [place] AT, moist mucus membranes.  Trachea midline, no masses.  Laboratory Data: Lab Results  Component Value Date   WBC 6.2 11/08/2019   HGB 14.5 11/08/2019   HCT 41.8 11/08/2019   MCV 94 11/08/2019   PLT 388 11/08/2019    Lab Results  Component Value Date   CREATININE 0.76 12/19/2021    No results found for: "PSA"  No results found for: "TESTOSTERONE"  No results found for: "HGBA1C"  Urinalysis   Pertinent Imaging:   Assessment & Plan: Patient will be reassessed in about 2 months and daily Macrodantin.  Hopefully he tolerates this well.  Upset stomach from trimethoprim placed in medical record  There are no diagnoses linked to this encounter.  No follow-ups on file.  Reece Packer, MD  [facility] [address], [place], [place] [address] [phone]

## 2022-01-27 NOTE — Addendum Note (Signed)
Addended by: Despina Hidden on: 01/27/2022 11:40 AM   Modules accepted: Orders

## 2022-03-19 DIAGNOSIS — L570 Actinic keratosis: Secondary | ICD-10-CM | POA: Diagnosis not present

## 2022-03-19 DIAGNOSIS — D0362 Melanoma in situ of left upper limb, including shoulder: Secondary | ICD-10-CM | POA: Diagnosis not present

## 2022-03-19 DIAGNOSIS — D485 Neoplasm of uncertain behavior of skin: Secondary | ICD-10-CM | POA: Diagnosis not present

## 2022-03-19 DIAGNOSIS — Z85828 Personal history of other malignant neoplasm of skin: Secondary | ICD-10-CM | POA: Diagnosis not present

## 2022-03-19 DIAGNOSIS — D2272 Melanocytic nevi of left lower limb, including hip: Secondary | ICD-10-CM | POA: Diagnosis not present

## 2022-03-19 DIAGNOSIS — D2262 Melanocytic nevi of left upper limb, including shoulder: Secondary | ICD-10-CM | POA: Diagnosis not present

## 2022-03-19 DIAGNOSIS — D2261 Melanocytic nevi of right upper limb, including shoulder: Secondary | ICD-10-CM | POA: Diagnosis not present

## 2022-03-19 DIAGNOSIS — X32XXXA Exposure to sunlight, initial encounter: Secondary | ICD-10-CM | POA: Diagnosis not present

## 2022-03-28 DIAGNOSIS — H35342 Macular cyst, hole, or pseudohole, left eye: Secondary | ICD-10-CM | POA: Diagnosis not present

## 2022-03-28 DIAGNOSIS — H2511 Age-related nuclear cataract, right eye: Secondary | ICD-10-CM | POA: Diagnosis not present

## 2022-03-28 DIAGNOSIS — H04123 Dry eye syndrome of bilateral lacrimal glands: Secondary | ICD-10-CM | POA: Diagnosis not present

## 2022-03-28 DIAGNOSIS — Z961 Presence of intraocular lens: Secondary | ICD-10-CM | POA: Diagnosis not present

## 2022-03-31 ENCOUNTER — Ambulatory Visit: Payer: Medicare HMO | Admitting: Urology

## 2022-03-31 ENCOUNTER — Encounter: Payer: Self-pay | Admitting: Urology

## 2022-03-31 VITALS — BP 163/79 | HR 96 | Ht 60.0 in | Wt 122.6 lb

## 2022-03-31 DIAGNOSIS — N3020 Other chronic cystitis without hematuria: Secondary | ICD-10-CM | POA: Diagnosis not present

## 2022-03-31 MED ORDER — NITROFURANTOIN MACROCRYSTAL 100 MG PO CAPS: 100.0000 mg | ORAL_CAPSULE | Freq: Every day | ORAL | 3 refills | Status: DC

## 2022-03-31 NOTE — Progress Notes (Signed)
03/31/2022 11:06 AM   Brandy Wong Jan 01, 1935 [id number]  Referring provider: Virginia Crews, MD [address] [place],  [place] [address]  Chief Complaint  Patient presents with   Follow-up    HPI: I was consulted to assess the patient's recurrent bladder infections.  She has had for this year.  She will get urgency and have difficulty to urinate   Normally she voids every 1-2 hours depending on fluid intake gets up 2-3 times a night.  She is continent.  Her bladder is more urgent when she is standing.   She has had prolapse managed with watchful waiting and used to wear a pessary for 12 years.  She is primarily concerned about her UTIs.  She can see prolapse but is not bothered by it   She has had a hysterectomy and likely a bladder suspension her prolapse surgery years ago.   Has difficulty urinating in public and relaxing.  Clinically not infected today     Patient has recurrent urinary tract infections.  She has frequency that is quite fluid dependent.  She has prolapse well-tolerated.  Renal ultrasound ordered.  Pathophysiology of UTIs and prophylaxis discussed.  Come back on trimethoprim suppression therapy for cystoscopy.  Examine prolapse at that time.  Check a postvoid residual then .  Patient understands the prolapse likely not related to UTIs    Today Patient never had a renal ultrasound. Patient was infection free on the daily trimethoprim but after 30 days she stopped it because it was upsetting her stomach and this settle down.  Clinically not infected today   She had a grade 3 cystocele that exited the introitus.  Vaginal cuff distended from 8 cm to 5 cm.  She had a fixed bladder neck.  She had some vaginal shortening and no posterior defect   Cystoscopy: Normal  Patient will be reassessed in about 2 months and daily Macrodantin. Hopefully he tolerates this well. Upset stomach from trimethoprim placed in medical record    Today Frequency  stable.  Last culture was positive Infection free on Macrobid   PMH: Past Medical History:  Diagnosis Date   Arthritis    Cancer ([place]) 2013   R breast, s/p radiation and lumpectomy   GERD (gastroesophageal reflux disease)    Hypertension     Surgical History: Past Surgical History:  Procedure Laterality Date   APPENDECTOMY     BREAST EXCISIONAL BIOPSY Left    benign   BREAST EXCISIONAL BIOPSY Left    benign   BREAST SURGERY  2013   lumpectomy post positive breast ca, radiation   CATARACT EXTRACTION     LAPAROSCOPIC VAGINAL HYSTERECTOMY WITH SALPINGO OOPHORECTOMY     TUBAL LIGATION      Home Medications:  Allergies as of 03/31/2022       Reactions   Amoxicillin Rash        Medication List        Accurate as of March 31, 2022 11:06 AM. If you have any questions, ask your nurse or doctor.          Cholecalciferol 25 MCG (1000 UT) tablet Take 1 tablet by mouth daily.   hydrochlorothiazide 25 MG tablet Commonly known as: HYDRODIURIL Take 1 tablet (25 mg total) by mouth daily.   losartan 50 MG tablet Commonly known as: COZAAR TAKE 1 TABLET BY MOUTH EVERY DAY   MAGNESIUM PO Take 1 capsule by mouth daily.   nitrofurantoin 100 MG capsule Commonly known  as: Macrodantin Take 1 capsule (100 mg total) by mouth daily.   omeprazole 40 MG capsule Commonly known as: PRILOSEC Take 1 capsule (40 mg total) by mouth daily.   SYSTANE OP Apply 1 drop to eye 4 (four) times daily as needed.   TURMERIC PO Take 1 tablet by mouth daily.        Allergies:  Allergies  Allergen Reactions   Amoxicillin Rash    Family History: Family History  Problem Relation Age of Onset   GER disease Mother    Dementia Mother 41   Stroke Father    Heart attack Father    Colon cancer Neg Hx    Rectal cancer Neg Hx    Stomach cancer Neg Hx    Esophageal cancer Neg Hx     Social History:  reports that she quit smoking about 35 years ago. Her smoking use included  cigarettes. She has a 1.50 pack-year smoking history. She has been exposed to tobacco smoke. She has never used smokeless tobacco. She reports current alcohol use of about 7.0 standard drinks of alcohol per week. She reports that she does not use drugs.  ROS:                                        Physical Exam: BP (!) 163/79 (BP Location: Left Arm, Patient Position: Sitting, Cuff Size: Normal)   Pulse 96   Ht 5' (1.524 m)   Wt 55.6 kg   BMI 23.94 kg/m   Constitutional:  Alert and oriented, No acute distress. HEENT: [place] AT, moist mucus membranes.  Trachea midline, no masses.  Laboratory Data: Lab Results  Component Value Date   WBC 6.2 11/08/2019   HGB 14.5 11/08/2019   HCT 41.8 11/08/2019   MCV 94 11/08/2019   PLT 388 11/08/2019    Lab Results  Component Value Date   CREATININE 0.76 12/19/2021    No results found for: "PSA"  No results found for: "TESTOSTERONE"  No results found for: "HGBA1C"  Urinalysis    Component Value Date/Time   BILIRUBINUR negative 09/09/2021 1332   PROTEINUR Positive (A) 09/09/2021 1332   UROBILINOGEN 0.2 09/09/2021 1332   NITRITE positive 09/09/2021 1332   LEUKOCYTESUR Small (1+) (A) 09/09/2021 1332    Pertinent Imaging:   Assessment & Plan: Prescription renewed and I will see in 1 year.  90 x 3 sent to pharmacy Macrobid  There are no diagnoses linked to this encounter.  No follow-ups on file.  Reece Packer, MD  [facility] [address], [place], [place] [address] [phone]

## 2022-04-07 DIAGNOSIS — D0362 Melanoma in situ of left upper limb, including shoulder: Secondary | ICD-10-CM | POA: Diagnosis not present

## 2022-04-24 ENCOUNTER — Other Ambulatory Visit: Payer: Self-pay | Admitting: Family Medicine

## 2022-04-24 DIAGNOSIS — I10 Essential (primary) hypertension: Secondary | ICD-10-CM

## 2022-05-28 DIAGNOSIS — L708 Other acne: Secondary | ICD-10-CM | POA: Diagnosis not present

## 2022-06-13 DIAGNOSIS — S92515A Nondisplaced fracture of proximal phalanx of left lesser toe(s), initial encounter for closed fracture: Secondary | ICD-10-CM | POA: Diagnosis not present

## 2022-06-20 DIAGNOSIS — S92515A Nondisplaced fracture of proximal phalanx of left lesser toe(s), initial encounter for closed fracture: Secondary | ICD-10-CM | POA: Diagnosis not present

## 2022-06-23 ENCOUNTER — Ambulatory Visit (INDEPENDENT_AMBULATORY_CARE_PROVIDER_SITE_OTHER): Payer: Medicare HMO | Admitting: Family Medicine

## 2022-06-23 ENCOUNTER — Encounter: Payer: Self-pay | Admitting: Family Medicine

## 2022-06-23 VITALS — BP 113/71 | HR 87 | Temp 97.9°F | Resp 12 | Wt 121.9 lb

## 2022-06-23 DIAGNOSIS — G4700 Insomnia, unspecified: Secondary | ICD-10-CM

## 2022-06-23 DIAGNOSIS — R55 Syncope and collapse: Secondary | ICD-10-CM | POA: Diagnosis not present

## 2022-06-23 DIAGNOSIS — M810 Age-related osteoporosis without current pathological fracture: Secondary | ICD-10-CM | POA: Diagnosis not present

## 2022-06-23 DIAGNOSIS — I10 Essential (primary) hypertension: Secondary | ICD-10-CM | POA: Diagnosis not present

## 2022-06-23 NOTE — Assessment & Plan Note (Signed)
Chronic and has previously tried and failed multiple medications including trazodone, nortriptyline, gabapentin Interestingly, she is now improved since starting nitrofurantoin for chronic UTIs

## 2022-06-23 NOTE — Progress Notes (Signed)
I,Sulibeya S Dimas,acting as a Neurosurgeon for Shirlee Latch, MD.,have documented all relevant documentation on the behalf of Shirlee Latch, MD,as directed by  Shirlee Latch, MD while in the presence of Shirlee Latch, MD.   Established patient visit   Patient: Brandy Wong   DOB: 1935/01/09   88 y.o. Female  MRN: [id number] Visit Date: 06/23/2022  Today's healthcare provider: Shirlee Latch, MD   Chief Complaint  Patient presents with   Hypertension   Subjective    HPI  Hypertension, follow-up  BP Readings from Last 3 Encounters:  06/23/22 113/71  03/31/22 (!) 163/79  01/27/22 (!) 143/83   Wt Readings from Last 3 Encounters:  06/23/22 121 lb 14.4 oz (55.3 kg)  03/31/22 122 lb 9.6 oz (55.6 kg)  01/27/22 119 lb (54 kg)     She was last seen for hypertension 6 months ago.  BP at that visit was 130/85. Management since that visit includes no changes.  She reports excellent compliance with treatment. She is not having side effects.   Outside blood pressures are not being checked daily. This morning BP 117/63, 120/70, 125/71.  Pertinent labs Lab Results  Component Value Date   CHOL 281 (H) 12/19/2021   HDL 106 12/19/2021   LDLCALC 156 (H) 12/19/2021   TRIG 115 12/19/2021   CHOLHDL 2.9 11/15/2020   Lab Results  Component Value Date   NA 132 (L) 12/19/2021   K 4.5 12/19/2021   CREATININE 0.76 12/19/2021   EGFR 77 12/19/2021   GLUCOSE 94 12/19/2021     The ASCVD Risk score (Arnett DK, et al., 2019) failed to calculate for the following reasons:   The 2019 ASCVD risk score is only valid for ages 46 to 52  --------------------------------------------------------------------------------------------------- 1.5 weeks ago - woke up with cold sweat and nausea/diarrhea.  Thinks she blacked out for 10 seconds - was sitting in the floor between the toilet and the wall. Broke her L 5th toe and bruised her L arm. Only 3rd episode in last 12 years - all happened  around stressful event/illness  Medications: Outpatient Medications Prior to Visit  Medication Sig   Cholecalciferol 25 MCG (1000 UT) tablet Take 1 tablet by mouth daily.   hydrochlorothiazide (HYDRODIURIL) 25 MG tablet TAKE 1 TABLET (25 MG TOTAL) BY MOUTH DAILY.   losartan (COZAAR) 50 MG tablet TAKE 1 TABLET BY MOUTH EVERY DAY   MAGNESIUM PO Take 1 capsule by mouth daily.   nitrofurantoin (MACRODANTIN) 100 MG capsule Take 1 capsule (100 mg total) by mouth daily.   omeprazole (PRILOSEC) 40 MG capsule Take 1 capsule (40 mg total) by mouth daily.   Polyethyl Glycol-Propyl Glycol (SYSTANE OP) Apply 1 drop to eye 4 (four) times daily as needed.   TURMERIC PO Take 1 tablet by mouth daily.   No facility-administered medications prior to visit.    Review of Systems  Respiratory:  Negative for cough, chest tightness and shortness of breath.   Cardiovascular:  Negative for chest pain and leg swelling.       Objective    BP 113/71 (BP Location: Left Arm, Patient Position: Sitting, Cuff Size: Normal)   Pulse 87   Temp 97.9 F (36.6 C) (Temporal)   Resp 12   Wt 121 lb 14.4 oz (55.3 kg)   BMI 23.81 kg/m    Physical Exam Vitals reviewed.  Constitutional:      General: She is not in acute distress.    Appearance: Normal appearance. She is well-developed.  She is not diaphoretic.  HENT:     Head: Normocephalic and atraumatic.  Eyes:     General: No scleral icterus.    Conjunctiva/sclera: Conjunctivae normal.  Neck:     Thyroid: No thyromegaly.  Cardiovascular:     Rate and Rhythm: Normal rate and regular rhythm.     Pulses: Normal pulses.     Heart sounds: Normal heart sounds. No murmur heard. Pulmonary:     Effort: Pulmonary effort is normal. No respiratory distress.     Breath sounds: Normal breath sounds. No wheezing, rhonchi or rales.  Musculoskeletal:     Cervical back: Neck supple.     Right lower leg: No edema.     Left lower leg: No edema.  Lymphadenopathy:      Cervical: No cervical adenopathy.  Skin:    General: Skin is warm and dry.     Findings: No rash.  Neurological:     Mental Status: She is alert and oriented to person, place, and time. Mental status is at baseline.  Psychiatric:        Mood and Affect: Mood normal.        Behavior: Behavior normal.       No results found for any visits on 06/23/22.  Assessment & Plan     Problem List Items Addressed This Visit       Cardiovascular and Mediastinum   Essential hypertension - Primary    Well controlled Continue current medications Recheck metabolic panel F/u in 6 months       Relevant Orders   Basic Metabolic Panel (BMET)     Musculoskeletal and Integument   Osteoporosis    Discussed possibly repeating DEXA Patient declines at this time as she is not interested in taking any treatment        Other   Insomnia    Chronic and has previously tried and failed multiple medications including trazodone, nortriptyline, gabapentin Interestingly, she is now improved since starting nitrofurantoin for chronic UTIs      Syncope    1 brief episode of syncope in the setting of hurrying to get to the bathroom for diarrhea Discussed with patient that we could do a Zio patch monitor x 14 days to rule out any arrhythmia She does not have any prodrome suggestive of arrhythmia however Patient declines at this time and would like to continue to monitor as this could have been a vasovagal episode        Return in about 6 months (around 12/23/2022) for AWV, CPE.      I, Shirlee Latch, MD, have reviewed all documentation for this visit. The documentation on 06/23/22 for the exam, diagnosis, procedures, and orders are all accurate and complete.   Brandy Wong, Brandy Schlein, MD, MPH [facility]

## 2022-06-23 NOTE — Assessment & Plan Note (Signed)
Well controlled Continue current medications Recheck metabolic panel F/u in 6 months  

## 2022-06-23 NOTE — Assessment & Plan Note (Signed)
Discussed possibly repeating DEXA Patient declines at this time as she is not interested in taking any treatment

## 2022-06-23 NOTE — Assessment & Plan Note (Signed)
1 brief episode of syncope in the setting of hurrying to get to the bathroom for diarrhea Discussed with patient that we could do a Zio patch monitor x 14 days to rule out any arrhythmia She does not have any prodrome suggestive of arrhythmia however Patient declines at this time and would like to continue to monitor as this could have been a vasovagal episode

## 2022-06-24 LAB — BASIC METABOLIC PANEL
BUN/Creatinine Ratio: 29 — ABNORMAL HIGH (ref 12–28)
BUN: 16 mg/dL (ref 8–27)
CO2: 23 mmol/L (ref 20–29)
Calcium: 10.6 mg/dL — ABNORMAL HIGH (ref 8.7–10.3)
Chloride: 96 mmol/L (ref 96–106)
Creatinine, Ser: 0.55 mg/dL — ABNORMAL LOW (ref 0.57–1.00)
Glucose: 95 mg/dL (ref 70–99)
Potassium: 4.6 mmol/L (ref 3.5–5.2)
Sodium: 136 mmol/L (ref 134–144)
eGFR: 90 mL/min/{1.73_m2} (ref >59)

## 2022-06-26 ENCOUNTER — Ambulatory Visit: Payer: Medicare HMO | Admitting: Family Medicine

## 2022-06-26 DIAGNOSIS — D2339 Other benign neoplasm of skin of other parts of face: Secondary | ICD-10-CM | POA: Diagnosis not present

## 2022-06-26 DIAGNOSIS — D485 Neoplasm of uncertain behavior of skin: Secondary | ICD-10-CM | POA: Diagnosis not present

## 2022-07-18 ENCOUNTER — Other Ambulatory Visit: Payer: Self-pay | Admitting: Family Medicine

## 2022-07-18 DIAGNOSIS — S92515A Nondisplaced fracture of proximal phalanx of left lesser toe(s), initial encounter for closed fracture: Secondary | ICD-10-CM | POA: Diagnosis not present

## 2022-07-18 NOTE — Telephone Encounter (Signed)
Requested Prescriptions  Pending Prescriptions Disp Refills   losartan (COZAAR) 50 MG tablet [Pharmacy Med Name: LOSARTAN POTASSIUM 50 MG TAB] 90 tablet 1    Sig: TAKE 1 TABLET BY MOUTH EVERY DAY     Cardiovascular:  Angiotensin Receptor Blockers Failed - 07/18/2022  2:35 AM      Failed - Cr in normal range and within 180 days    Creatinine, Ser  Date Value Ref Range Status  06/23/2022 0.55 (L) 0.57 - 1.00 mg/dL Final         Passed - K in normal range and within 180 days    Potassium  Date Value Ref Range Status  06/23/2022 4.6 3.5 - 5.2 mmol/L Final         Passed - Patient is not pregnant      Passed - Last BP in normal range    BP Readings from Last 1 Encounters:  06/23/22 113/71         Passed - Valid encounter within last 6 months    Recent Outpatient Visits           3 weeks ago Essential hypertension   [facility] [place], Marzella Schlein, MD   7 months ago Encounter for subsequent annual wellness visit in Medicare patient   [facility] [place], Marzella Schlein, MD   10 months ago UTI symptoms   [facility] [place] M, DO   11 months ago Essential hypertension   [facility] [place], Marzella Schlein, MD   1 year ago Primary hypertension   [facility] [place], Marzella Schlein, MD       Future Appointments             In 5 months Bacigalupo, Marzella Schlein, MD [facility]   In 8 months MacDiarmid, Lorin Picket, MD [facility]

## 2022-08-11 DIAGNOSIS — M9903 Segmental and somatic dysfunction of lumbar region: Secondary | ICD-10-CM | POA: Diagnosis not present

## 2022-08-11 DIAGNOSIS — M9904 Segmental and somatic dysfunction of sacral region: Secondary | ICD-10-CM | POA: Diagnosis not present

## 2022-08-11 DIAGNOSIS — M9905 Segmental and somatic dysfunction of pelvic region: Secondary | ICD-10-CM | POA: Diagnosis not present

## 2022-08-11 DIAGNOSIS — M545 Low back pain, unspecified: Secondary | ICD-10-CM | POA: Diagnosis not present

## 2022-08-14 DIAGNOSIS — K219 Gastro-esophageal reflux disease without esophagitis: Secondary | ICD-10-CM | POA: Diagnosis not present

## 2022-08-14 DIAGNOSIS — Z880 Allergy status to penicillin: Secondary | ICD-10-CM | POA: Diagnosis not present

## 2022-08-14 DIAGNOSIS — Z8249 Family history of ischemic heart disease and other diseases of the circulatory system: Secondary | ICD-10-CM | POA: Diagnosis not present

## 2022-08-14 DIAGNOSIS — D692 Other nonthrombocytopenic purpura: Secondary | ICD-10-CM | POA: Diagnosis not present

## 2022-08-14 DIAGNOSIS — I10 Essential (primary) hypertension: Secondary | ICD-10-CM | POA: Diagnosis not present

## 2022-08-14 DIAGNOSIS — M1990 Unspecified osteoarthritis, unspecified site: Secondary | ICD-10-CM | POA: Diagnosis not present

## 2022-08-14 DIAGNOSIS — N3090 Cystitis, unspecified without hematuria: Secondary | ICD-10-CM | POA: Diagnosis not present

## 2022-08-14 DIAGNOSIS — Z9181 History of falling: Secondary | ICD-10-CM | POA: Diagnosis not present

## 2022-08-14 DIAGNOSIS — R2681 Unsteadiness on feet: Secondary | ICD-10-CM | POA: Diagnosis not present

## 2022-08-14 DIAGNOSIS — Z87891 Personal history of nicotine dependence: Secondary | ICD-10-CM | POA: Diagnosis not present

## 2022-08-14 DIAGNOSIS — Z87440 Personal history of urinary (tract) infections: Secondary | ICD-10-CM | POA: Diagnosis not present

## 2022-08-14 DIAGNOSIS — Z823 Family history of stroke: Secondary | ICD-10-CM | POA: Diagnosis not present

## 2022-08-15 DIAGNOSIS — M9903 Segmental and somatic dysfunction of lumbar region: Secondary | ICD-10-CM | POA: Diagnosis not present

## 2022-08-15 DIAGNOSIS — M9904 Segmental and somatic dysfunction of sacral region: Secondary | ICD-10-CM | POA: Diagnosis not present

## 2022-08-15 DIAGNOSIS — M545 Low back pain, unspecified: Secondary | ICD-10-CM | POA: Diagnosis not present

## 2022-08-15 DIAGNOSIS — M9905 Segmental and somatic dysfunction of pelvic region: Secondary | ICD-10-CM | POA: Diagnosis not present

## 2022-10-07 ENCOUNTER — Telehealth: Payer: Self-pay | Admitting: Internal Medicine

## 2022-10-07 NOTE — Telephone Encounter (Signed)
Pt states she has had some chest soreness and feels it is related to her reflux, states she also have a cough that is from reflux. States she usually does not have epigastric pain and wants to be seen. Pt scheduled to see Willette Cluster NP tomorrow at 2pm. Pt aware of appt and will call if she cannot keep the appt.

## 2022-10-07 NOTE — Telephone Encounter (Signed)
Patient called states she has been having upper GI symptoms feeling pain by her chest bone and it feels sore when she breaths. Sometimes she has a dry cough. She asked for a call back from 11:00 am and after.

## 2022-10-08 ENCOUNTER — Ambulatory Visit: Payer: Medicare HMO | Admitting: Nurse Practitioner

## 2022-10-08 ENCOUNTER — Ambulatory Visit (INDEPENDENT_AMBULATORY_CARE_PROVIDER_SITE_OTHER)
Admission: RE | Admit: 2022-10-08 | Discharge: 2022-10-08 | Disposition: A | Discharge status: Home / Self Care | Payer: Medicare HMO | Source: Ambulatory Visit | Attending: Nurse Practitioner | Admitting: Nurse Practitioner

## 2022-10-08 VITALS — BP 130/76 | HR 78 | Ht 61.0 in | Wt 122.0 lb

## 2022-10-08 DIAGNOSIS — D225 Melanocytic nevi of trunk: Secondary | ICD-10-CM | POA: Diagnosis not present

## 2022-10-08 DIAGNOSIS — Z85828 Personal history of other malignant neoplasm of skin: Secondary | ICD-10-CM | POA: Diagnosis not present

## 2022-10-08 DIAGNOSIS — Z86006 Personal history of melanoma in-situ: Secondary | ICD-10-CM | POA: Diagnosis not present

## 2022-10-08 DIAGNOSIS — D2339 Other benign neoplasm of skin of other parts of face: Secondary | ICD-10-CM | POA: Diagnosis not present

## 2022-10-08 DIAGNOSIS — D2262 Melanocytic nevi of left upper limb, including shoulder: Secondary | ICD-10-CM | POA: Diagnosis not present

## 2022-10-08 DIAGNOSIS — D2261 Melanocytic nevi of right upper limb, including shoulder: Secondary | ICD-10-CM | POA: Diagnosis not present

## 2022-10-08 DIAGNOSIS — R0789 Other chest pain: Secondary | ICD-10-CM | POA: Diagnosis not present

## 2022-10-08 DIAGNOSIS — Z08 Encounter for follow-up examination after completed treatment for malignant neoplasm: Secondary | ICD-10-CM | POA: Diagnosis not present

## 2022-10-08 DIAGNOSIS — L821 Other seborrheic keratosis: Secondary | ICD-10-CM | POA: Diagnosis not present

## 2022-10-08 DIAGNOSIS — D2272 Melanocytic nevi of left lower limb, including hip: Secondary | ICD-10-CM | POA: Diagnosis not present

## 2022-10-08 DIAGNOSIS — L570 Actinic keratosis: Secondary | ICD-10-CM | POA: Diagnosis not present

## 2022-10-08 DIAGNOSIS — D2271 Melanocytic nevi of right lower limb, including hip: Secondary | ICD-10-CM | POA: Diagnosis not present

## 2022-10-08 DIAGNOSIS — D485 Neoplasm of uncertain behavior of skin: Secondary | ICD-10-CM | POA: Diagnosis not present

## 2022-10-08 NOTE — Patient Instructions (Addendum)
_______________________________________________________  If your blood pressure at your visit was 140/90 or greater, please contact your primary care physician to follow up on this.  _______________________________________________________  If you are age 86 or older, your body mass index should be between 23-30. Your Body mass index is 23.05 kg/m. If this is out of the aforementioned range listed, please consider follow up with your Primary Care Provider.  If you are age 43 or younger, your body mass index should be between 19-25. Your Body mass index is 23.05 kg/m. If this is out of the aformentioned range listed, please consider follow up with your Primary Care Provider.   ________________________________________________________  The [facility] providers would like to encourage you to use [facility] to communicate with providers for non-urgent requests or questions.  Due to long hold times on the telephone, sending your provider a message by [facility] may be a faster and more efficient way to get a response.  Please allow 48 business hours for a response.  Please remember that this is for non-urgent requests.  _______________________________________________________  Brandy Wong can use Ibuprofen 400 mg three times a day for 3 days.   Call us in a week with an update.   Your provider has requested that you go to the basement level for x-rays before leaving today. Press "B" on the elevator.  Due to recent changes in healthcare laws, you may see the results of your imaging and laboratory studies on MyChart before your provider has had a chance to review them.  We understand that in some cases there may be results that are confusing or concerning to you. Not all laboratory results come back in the same time frame and the provider may be waiting for multiple results in order to interpret others.  Please give [place] 48 hours in order for your provider to thoroughly review all the results before contacting the  office for clarification of your results.   It was a pleasure to see you today!  Thank you for trusting me with your gastrointestinal care!

## 2022-10-08 NOTE — Progress Notes (Unsigned)
Primary GI: Yancey Flemings, MD   ASSESSMENT & PLAN   Brief Narrative:  88 y.o.  female whose past medical history includes,  but is not necessarily limited to, melanoma, chronic cough  Chronic GERD ***  HPI   Brief GI history Brandy Wong was last seen November 2022 for evaluation of a chronic cough  and occasional dysphagia.  We started her on a PPI subsequently an upper endoscopy was done January 2023 and it was normal.  Daily PPI was continued  Interval History    Chief complaint :    Developed an acute chest pain last Friday am while waling around the house. Wasn't exerting herself or carrrying anything. Last about 5-10 minutes and then eased off. Went to be and te next morning noticed that chest hurt when took breath.   No associated SOB. The pain did not radiate anywhere. Felt like she was trng to swallow something that was too large though she wasn't eating at that time. She has dry cough which she attributes to reflux. We doubled her PPI at last visit which has nelped but not alleviated the dry cough. Cough is her only reflux symptom.   Appetite hasn't been good for the last few days. Weight is stable, actually slowly gaining. Chronic hot flashes, no fevers.      Procedure risk assessment:  No history of CHF.  No supplemental 02 use at home.  Not a known difficult airway Anticoagulant:     Previous GI Endoscopies / Labs / Imaging   Jan 2023 EGD -Normal   No recent labs or imaging      Latest Ref Rng & Units 12/19/2021   11:48 AM 12/03/2020    8:47 AM 11/15/2020   10:23 AM  Hepatic Function  Total Protein 6.0 - 8.5 g/dL 7.2  7.1  7.4   Albumin 3.7 - 4.7 g/dL 5.0  4.6  4.9   AST 0 - 40 IU/L 24  23  26    ALT 0 - 32 IU/L 15  16  16    Alk Phosphatase 44 - 121 IU/L 92  73  76   Total Bilirubin 0.0 - 1.2 mg/dL 0.4  0.4  0.3        Latest Ref Rng & Units 11/08/2019   10:01 AM 11/08/2018   11:31 AM 11/03/2017   10:47 AM  CBC  WBC 3.4 - 10.8 x10E3/uL 6.2  8.1  6.9    Hemoglobin 11.1 - 15.9 g/dL 06.2  69.4  85.4   Hematocrit 34.0 - 46.6 % 41.8  43.1  40.6   Platelets 150 - 450 x10E3/uL 388  429  414      Past Medical History:  Diagnosis Date   Arthritis    Cancer (HCC) 2013   R breast, s/p radiation and lumpectomy   Cataract    R eye   Chronic cystitis    Female bladder prolapse    Frequent urination    GERD (gastroesophageal reflux disease)    Hypertension    Insomnia    Melanoma (HCC)    left upper limb   Squamous cell carcinoma in situ     Past Surgical History:  Procedure Laterality Date   APPENDECTOMY     BREAST EXCISIONAL BIOPSY Left    benign   BREAST EXCISIONAL BIOPSY Left    benign   BREAST SURGERY  2013   lumpectomy post positive breast ca, radiation   CATARACT EXTRACTION     LAPAROSCOPIC VAGINAL HYSTERECTOMY WITH SALPINGO  OOPHORECTOMY     TUBAL LIGATION      Family History  Problem Relation Age of Onset   GER disease Mother    Dementia Mother 52   Stroke Father    Heart attack Father    Colon cancer Neg Hx    Rectal cancer Neg Hx    Stomach cancer Neg Hx    Esophageal cancer Neg Hx     Current Medications, Allergies, Family History and Social History were reviewed in [employer] electronic medical record.     Current Outpatient Medications  Medication Sig Dispense Refill   Cholecalciferol 25 MCG (1000 UT) tablet Take 1 tablet by mouth daily.     hydrochlorothiazide (HYDRODIURIL) 25 MG tablet TAKE 1 TABLET (25 MG TOTAL) BY MOUTH DAILY. 90 tablet 1   losartan (COZAAR) 50 MG tablet TAKE 1 TABLET BY MOUTH EVERY DAY 90 tablet 1   MAGNESIUM PO Take 1 capsule by mouth daily.     nitrofurantoin (MACRODANTIN) 100 MG capsule Take 1 capsule (100 mg total) by mouth daily. 90 capsule 3   omeprazole (PRILOSEC) 40 MG capsule Take 1 capsule (40 mg total) by mouth daily. 90 capsule 4   Polyethyl Glycol-Propyl Glycol (SYSTANE OP) Apply 1 drop to eye 4 (four) times daily as needed.     TURMERIC PO Take 1 tablet by mouth  daily.     No current facility-administered medications for this visit.    Review of Systems: No chest pain. No shortness of breath. No urinary complaints.    Physical Exam  Wt Readings from Last 3 Encounters:  06/23/22 121 lb 14.4 oz (55.3 kg)  03/31/22 122 lb 9.6 oz (55.6 kg)  01/27/22 119 lb (54 kg)    There were no vitals taken for this visit. Constitutional:  Pleasant, generally well appearing ***female in no acute distress. Psychiatric: Normal mood and affect. Behavior is normal. EENT: Pupils normal.  Conjunctivae are normal. No scleral icterus. Neck supple.  Cardiovascular: Normal rate, regular rhythm.  Pulmonary/chest: Effort normal and breath sounds normal. No wheezing, rales or rhonchi. Abdominal: Soft, nondistended, nontender. Bowel sounds active throughout. There are no masses palpable. No hepatomegaly. Neurological: Alert and oriented to person place and time.  Extremities: *** edema Skin: Skin is warm and dry. No rashes noted.  Brandy Cluster, NP  10/08/2022, 1:06 PM  Cc:  Erasmo Downer, MD

## 2022-10-09 ENCOUNTER — Encounter: Payer: Self-pay | Admitting: Nurse Practitioner

## 2022-10-14 ENCOUNTER — Ambulatory Visit: Payer: Medicare HMO | Admitting: Gastroenterology

## 2022-10-14 ENCOUNTER — Telehealth: Payer: Self-pay | Admitting: Nurse Practitioner

## 2022-10-14 NOTE — Telephone Encounter (Signed)
Brandy Wong,  I tried to call Ms. Newman to see if her chest discomfort was better.  I am not sure why but the chest x-ray is not yet read though I expect it to be normal.  Would you mind calling her in a day or 2 and see if her chest pain is getting better.  Thanks

## 2022-10-15 NOTE — Telephone Encounter (Signed)
Patient advised of negative/normal chest xray. She states that she took a few days of ibuprofen as recently recommended by Willette Cluster, NP and has had no further episodes of chest pain. Advised patient to call back with any additional questions or concerns.

## 2022-10-26 ENCOUNTER — Other Ambulatory Visit: Payer: Self-pay | Admitting: Family Medicine

## 2022-10-26 DIAGNOSIS — I10 Essential (primary) hypertension: Secondary | ICD-10-CM

## 2022-10-29 ENCOUNTER — Other Ambulatory Visit: Payer: Self-pay | Admitting: Family Medicine

## 2022-11-05 DIAGNOSIS — M545 Low back pain, unspecified: Secondary | ICD-10-CM | POA: Diagnosis not present

## 2022-11-05 DIAGNOSIS — M9905 Segmental and somatic dysfunction of pelvic region: Secondary | ICD-10-CM | POA: Diagnosis not present

## 2022-11-05 DIAGNOSIS — M9903 Segmental and somatic dysfunction of lumbar region: Secondary | ICD-10-CM | POA: Diagnosis not present

## 2022-11-05 DIAGNOSIS — M9904 Segmental and somatic dysfunction of sacral region: Secondary | ICD-10-CM | POA: Diagnosis not present

## 2022-11-13 ENCOUNTER — Other Ambulatory Visit: Payer: Self-pay | Admitting: Family Medicine

## 2022-11-13 DIAGNOSIS — Z1231 Encounter for screening mammogram for malignant neoplasm of breast: Secondary | ICD-10-CM

## 2022-11-18 ENCOUNTER — Ambulatory Visit (INDEPENDENT_AMBULATORY_CARE_PROVIDER_SITE_OTHER): Payer: Medicare HMO

## 2022-11-18 VITALS — Ht 61.0 in | Wt 122.0 lb

## 2022-11-18 DIAGNOSIS — Z0000 Encounter for general adult medical examination without abnormal findings: Secondary | ICD-10-CM

## 2022-11-18 NOTE — Patient Instructions (Signed)
Ms. Espitia , Thank you for taking time to come for your Medicare Wellness Visit. I appreciate your ongoing commitment to your health goals. Please review the following plan we discussed and let me know if I can assist you in the future.   Referrals/Orders/Follow-Ups/Clinician Recommendations: none  This is a list of the screening recommended for you and due dates:  Health Maintenance  Topic Date Due   DEXA scan (bone density measurement)  01/02/2022   Flu Shot  10/09/2022   COVID-19 Vaccine (4 - 2023-24 season) 11/09/2022   Medicare Annual Wellness Visit  11/18/2023   DTaP/Tdap/Td vaccine (2 - Tdap) 01/20/2027   Pneumonia Vaccine  Completed   Zoster (Shingles) Vaccine  Completed   HPV Vaccine  Aged Out    Advanced directives: (In Chart) A copy of your advanced directives are scanned into your chart should your provider ever need it.  Next Medicare Annual Wellness Visit scheduled for next year: Yes 11/23/2023 @ 3:30pm telephone

## 2022-11-18 NOTE — Progress Notes (Signed)
Subjective:   Brandy Wong is a 88 y.o. female who presents for Medicare Annual (Subsequent) preventive examination.  Visit Complete: Virtual  I connected with  Brandy Wong on 11/18/22 by a audio enabled telemedicine application and verified that I am speaking with the correct person using two identifiers.  Patient Location: Home  Provider Location: Office/Clinic  I discussed the limitations of evaluation and management by telemedicine. The patient expressed understanding and agreed to proceed.  Vital Signs: Unable to obtain new vitals due to this being a telehealth visit.  Patient Medicare AWV questionnaire was completed by the patient on (not done); I have confirmed that all information answered by patient is correct and no changes since this date.  Review of Systems    Cardiac Risk Factors include: advanced age (>22men, >1 women);hypertension    Objective:    Today's Vitals   11/18/22 1537  Weight: 122 lb (55.3 kg)  Height: 5\' 1"  (1.549 m)   Body mass index is 23.05 kg/m.     11/18/2022    3:48 PM 11/08/2018    8:12 AM 11/03/2017    9:46 AM 10/31/2016   10:23 AM  Advanced Directives  Does Patient Have a Medical Advance Directive? Yes Yes Yes Yes  Type of Estate agent of [place];Living will Healthcare Power of [place];Living will Healthcare Power of [place];Living will Healthcare Power of [place];Living will  Copy of Healthcare Power of Attorney in Chart?  Yes - validated most recent copy scanned in chart (See row information) No - copy requested     Current Medications (verified) Outpatient Encounter Medications as of 11/18/2022  Medication Sig   Cholecalciferol 25 MCG (1000 UT) tablet Take 1 tablet by mouth daily.   hydrochlorothiazide (HYDRODIURIL) 25 MG tablet TAKE 1 TABLET (25 MG TOTAL) BY MOUTH DAILY.   losartan (COZAAR) 50 MG tablet TAKE 1 TABLET BY MOUTH EVERY DAY   MAGNESIUM PO Take 1 capsule by mouth daily.   nitrofurantoin  (MACRODANTIN) 100 MG capsule Take 1 capsule (100 mg total) by mouth daily.   omeprazole (PRILOSEC) 40 MG capsule TAKE 1 CAPSULE (40 MG TOTAL) BY MOUTH DAILY.   Polyethyl Glycol-Propyl Glycol (SYSTANE OP) Apply 1 drop to eye 4 (four) times daily as needed.   TURMERIC PO Take 1 tablet by mouth daily.   No facility-administered encounter medications on file as of 11/18/2022.    Allergies (verified) Amoxicillin   History: Past Medical History:  Diagnosis Date   Arthritis    Cancer (HCC) 2013   R breast, s/p radiation and lumpectomy   Cataract    R eye   Chronic cystitis    Female bladder prolapse    Frequent urination    GERD (gastroesophageal reflux disease)    Hypertension    Insomnia    Melanoma (HCC)    left upper limb   Squamous cell carcinoma in situ    Past Surgical History:  Procedure Laterality Date   APPENDECTOMY     BREAST EXCISIONAL BIOPSY Left    benign   BREAST EXCISIONAL BIOPSY Left    benign   BREAST SURGERY  2013   lumpectomy post positive breast ca, radiation   CATARACT EXTRACTION     LAPAROSCOPIC VAGINAL HYSTERECTOMY WITH SALPINGO OOPHORECTOMY     TUBAL LIGATION     Family History  Problem Relation Age of Onset   GER disease Mother    Dementia Mother 69   Stroke Father    Heart attack Father  Colon cancer Neg Hx    Rectal cancer Neg Hx    Stomach cancer Neg Hx    Esophageal cancer Neg Hx    Social History   Socioeconomic History   Marital status: Widowed    Spouse name: Not on file   Number of children: 2   Years of education: some college   Highest education level: Some college, no degree  Occupational History    Employer: RETIRED  Tobacco Use   Smoking status: Former    Current packs/day: 0.00    Average packs/day: 0.2 packs/day for 10.0 years (1.5 ttl pk-yrs)    Types: Cigarettes    Start date: 03/09/1977    Quit date: 03/10/1987    Years since quitting: 35.7    Passive exposure: Past   Smokeless tobacco: Never  Vaping Use    Vaping status: Never Used  Substance and Sexual Activity   Alcohol use: Yes    Alcohol/week: 7.0 standard drinks of alcohol    Types: 7 Glasses of wine per week    Comment: 1 glass a day   Drug use: No   Sexual activity: Never  Other Topics Concern   Not on file  Social History Narrative   Not on file   Social Determinants of Health   Financial Resource Strain: Low Risk  (11/18/2022)   Overall Financial Resource Strain (CARDIA)    Difficulty of Paying Living Expenses: Not hard at all  Food Insecurity: No Food Insecurity (11/18/2022)   Hunger Vital Sign    Worried About Running Out of Food in the Last Year: Never true    Ran Out of Food in the Last Year: Never true  Transportation Needs: No Transportation Needs (11/18/2022)   PRAPARE - Administrator, Civil Service (Medical): No    Lack of Transportation (Non-Medical): No  Physical Activity: Sufficiently Active (11/18/2022)   Exercise Vital Sign    Days of Exercise per Week: 5 days    Minutes of Exercise per Session: 30 min  Stress: No Stress Concern Present (11/18/2022)   [employer] of Occupational Health - Occupational Stress Questionnaire    Feeling of Stress : Only a little  Social Connections: Socially Isolated (11/18/2022)   Social Connection and Isolation Panel [NHANES]    Frequency of Communication with Friends and Family: More than three times a week    Frequency of Social Gatherings with Friends and Family: More than three times a week    Attends Religious Services: Never    Database administrator or Organizations: No    Attends Banker Meetings: Never    Marital Status: Widowed    Tobacco Counseling Counseling given: Not Answered   Clinical Intake:  Pre-visit preparation completed: Yes  Pain : No/denies pain     BMI - recorded: 23.05 Nutritional Status: BMI of 19-24  Normal Nutritional Risks: None Diabetes: No  How often do you need to have someone help you when you read  instructions, pamphlets, or other written materials from your doctor or pharmacy?: 1 - Never  Interpreter Needed?: No  Comments: lives alone Information entered by :: B.Leniya Breit,LPN   Activities of Daily Living    11/18/2022    3:48 PM 06/23/2022    3:03 PM  In your present state of health, do you have any difficulty performing the following activities:  Hearing? 0 0  Vision? 0 0  Difficulty concentrating or making decisions? 0 0  Walking or climbing stairs? 0 1  Dressing or bathing? 0 0  Doing errands, shopping? 0 0  Preparing Food and eating ? N   Using the Toilet? N   In the past six months, have you accidently leaked urine? N   Do you have problems with loss of bowel control? N   Managing your Medications? N   Managing your Finances? N   Housekeeping or managing your Housekeeping? N     Patient Care Team: Erasmo Downer, MD as PCP - General (Family Medicine) Dasher, Cliffton Asters, MD (Dermatology) Lockie Mola, MD as Referring Physician (Ophthalmology)  Indicate any recent Medical Services you may have received from other than [facility] providers in the past year (date may be approximate).     Assessment:   This is a routine wellness examination for [place].  Hearing/Vision screen Hearing Screening - Comments:: Adequate hearing Vision Screening - Comments:: Adequate vision for reading [facility]    Goals Addressed             This Visit's Progress    DIET - INCREASE WATER INTAKE   Not on track    Recommend increasing water intake to 3 glasses a day.      Prevent falls   Not on track    Recommend to remove any items from the home that may cause slips or trips.       Depression Screen    11/18/2022    3:46 PM 06/23/2022    3:03 PM 12/19/2021   10:50 AM 05/27/2021    1:59 PM 11/15/2020    9:16 AM 11/16/2019   10:32 AM 11/08/2019    9:25 AM  PHQ 2/9 Scores  PHQ - 2 Score 0  0 0 0 0 0  PHQ- 9 Score   4 3 4  3   Exception Documentation   Patient refusal         Fall Risk    11/18/2022    3:39 PM 06/23/2022    3:00 PM 12/19/2021   10:50 AM 12/19/2021   10:49 AM 05/27/2021    1:59 PM  Fall Risk   Falls in the past year? 1 1 1 1    Number falls in past yr: 0 0 0 0 0  Injury with Fall? 1 1 0 0 0  Comment 3 broken toes      Risk for fall due to : No Fall Risks History of fall(s) Impaired balance/gait No Fall Risks No Fall Risks  Follow up Falls prevention discussed;Education provided Falls evaluation completed Falls evaluation completed;Education provided;Falls prevention discussed  Falls evaluation completed    MEDICARE RISK AT HOME: Medicare Risk at Home Any stairs in or around the home?: No If so, are there any without handrails?: No Home free of loose throw rugs in walkways, pet beds, electrical cords, etc?: Yes Adequate lighting in your home to reduce risk of falls?: Yes Life alert?: No Use of a cane, walker or w/c?: No Grab bars in the bathroom?: Yes Shower chair or bench in shower?: Yes Elevated toilet seat or a handicapped toilet?: Yes  TIMED UP AND GO:  Was the test performed?  No    Cognitive Function:        11/18/2022    3:50 PM 11/08/2019    9:12 AM 11/03/2017    9:53 AM  6CIT Screen  What Year? 0 points 0 points 0 points  What month? 0 points 0 points 0 points  What time? 0 points 0 points 0 points  Count back  from 20 0 points 0 points 0 points  Months in reverse 0 points 0 points 0 points  Repeat phrase 0 points 2 points 0 points  Total Score 0 points 2 points 0 points    Immunizations Immunization History  Administered Date(s) Administered   Fluad Quad(high Dose 65+) 11/08/2018, 11/17/2019, 11/15/2020, 12/19/2021   Influenza, High Dose Seasonal PF 10/31/2016, 11/11/2017   PFIZER(Purple Top)SARS-COV-2 Vaccination 03/25/2019, 04/15/2019, 02/29/2020   Pneumococcal Conjugate-13 11/22/2014   Pneumococcal Polysaccharide-23 01/19/2017   Respiratory Syncytial Virus Vaccine,Recomb  Aduvanted(Arexvy) 01/14/2022   Td 01/19/2017   Zoster Recombinant(Shingrix) 12/10/2018, 05/13/2019    TDAP status: Up to date  Flu Vaccine status: Up to date  Pneumococcal vaccine status: Up to date  Covid-19 vaccine status: Completed vaccines  Qualifies for Shingles Vaccine? Yes   Zostavax completed Yes   Shingrix Completed?: Yes  Screening Tests Health Maintenance  Topic Date Due   DEXA SCAN  01/02/2022   INFLUENZA VACCINE  10/09/2022   COVID-19 Vaccine (4 - 2023-24 season) 11/09/2022   Medicare Annual Wellness (AWV)  11/18/2023   DTaP/Tdap/Td (2 - Tdap) 01/20/2027   Pneumonia Vaccine 51+ Years old  Completed   Zoster Vaccines- Shingrix  Completed   HPV VACCINES  Aged Out    Health Maintenance  Health Maintenance Due  Topic Date Due   DEXA SCAN  01/02/2022   INFLUENZA VACCINE  10/09/2022   COVID-19 Vaccine (4 - 2023-24 season) 11/09/2022    Colorectal cancer screening: No longer required.   Mammogram status: No longer required due to age.  Lung Cancer Screening: (Low Dose CT Chest recommended if Age 69-80 years, 20 pack-year currently smoking OR have quit w/in 15years.) does not qualify.   Lung Cancer Screening Referral: no  Additional Screening:  Hepatitis C Screening: does not qualify; Completed yes  Vision Screening: Recommended annual ophthalmology exams for early detection of glaucoma and other disorders of the eye. Is the patient up to date with their annual eye exam?  Yes  Who is the provider or what is the name of the office in which the patient attends annual eye exams? Dr Inez Pilgrim If pt is not established with a provider, would they like to be referred to a provider to establish care? No .   Dental Screening: Recommended annual dental exams for proper oral hygiene  Diabetic Foot Exam: n/a  Community Resource Referral / Chronic Care Management: CRR required this visit?  No   CCM required this visit?  No    Plan:     I have personally  reviewed and noted the following in the patient's chart:   Medical and social history Use of alcohol, tobacco or illicit drugs  Current medications and supplements including opioid prescriptions. Patient is not currently taking opioid prescriptions. Functional ability and status Nutritional status Physical activity Advanced directives List of other physicians Hospitalizations, surgeries, and ER visits in previous 12 months Vitals Screenings to include cognitive, depression, and falls Referrals and appointments  In addition, I have reviewed and discussed with patient certain preventive protocols, quality metrics, and best practice recommendations. A written personalized care plan for preventive services as well as general preventive health recommendations were provided to patient.    Sue Lush, LPN   [id number]   After Visit Summary: (MyChart) Due to this being a telephonic visit, the after visit summary with patients personalized plan was offered to patient via MyChart   Nurse Notes: The patient states she is doing well and has no concerns or  questions at this time.

## 2022-12-23 ENCOUNTER — Ambulatory Visit (INDEPENDENT_AMBULATORY_CARE_PROVIDER_SITE_OTHER): Payer: Medicare HMO | Admitting: Family Medicine

## 2022-12-23 ENCOUNTER — Encounter: Payer: Self-pay | Admitting: Family Medicine

## 2022-12-23 VITALS — BP 136/80 | HR 83 | Temp 97.9°F | Ht 61.0 in | Wt 120.7 lb

## 2022-12-23 DIAGNOSIS — E559 Vitamin D deficiency, unspecified: Secondary | ICD-10-CM

## 2022-12-23 DIAGNOSIS — E782 Mixed hyperlipidemia: Secondary | ICD-10-CM | POA: Diagnosis not present

## 2022-12-23 DIAGNOSIS — Z79899 Other long term (current) drug therapy: Secondary | ICD-10-CM | POA: Diagnosis not present

## 2022-12-23 DIAGNOSIS — D692 Other nonthrombocytopenic purpura: Secondary | ICD-10-CM

## 2022-12-23 DIAGNOSIS — M810 Age-related osteoporosis without current pathological fracture: Secondary | ICD-10-CM

## 2022-12-23 DIAGNOSIS — Z23 Encounter for immunization: Secondary | ICD-10-CM

## 2022-12-23 DIAGNOSIS — Z0000 Encounter for general adult medical examination without abnormal findings: Secondary | ICD-10-CM | POA: Diagnosis not present

## 2022-12-23 DIAGNOSIS — R2689 Other abnormalities of gait and mobility: Secondary | ICD-10-CM | POA: Diagnosis not present

## 2022-12-23 DIAGNOSIS — I10 Essential (primary) hypertension: Secondary | ICD-10-CM | POA: Diagnosis not present

## 2022-12-23 NOTE — Assessment & Plan Note (Signed)
Chronic and stable.

## 2022-12-23 NOTE — Progress Notes (Signed)
Complete physical exam  Patient: Brandy Wong   DOB: May 28, 1934   88 y.o. Female  MRN: [id number]  Subjective:    Chief Complaint  Patient presents with   Annual Exam    Annual physical    Brandy Wong is a 88 y.o. female who presents today for a complete physical exam. She reports consuming a general diet.  She generally feels fairly well. She does have additional problems to discuss today.    Discussed the use of AI scribe software for clinical note transcription with the patient, who gave verbal consent to proceed.  History of Present Illness   The patient, with a history of polio and osteoporosis, presents with concerns about balance issues that have been ongoing for about a year. The patient reports difficulty maintaining balance when turning their head while walking and has to pay close attention to where they are stepping. The patient has tried physical therapy in the past with no improvement and is considering seeing a neurologist for further evaluation.  The patient also reports foot pain, described as sharp and intermittent, located at the top of the foot and where the toes end. The pain does not occur every day and does not feel like pins and needles. The patient has also noticed a slight numbness in the toes.  The patient has been experiencing back pain, which worsens after standing for about 15 minutes. The pain is relieved by sitting down. The patient has been seeing a chiropractor every two to three months for this issue.  The patient also reports fatigue, stating that they feel tired after doing minimal activities. The patient has noticed a decrease in energy over the past year.       Most recent fall risk assessment:    12/23/2022   10:42 AM  Fall Risk   Falls in the past year? 1  Number falls in past yr: 0  Injury with Fall? 1     Most recent depression screenings:    12/23/2022   10:42 AM 11/18/2022    3:46 PM  PHQ 2/9 Scores  PHQ - 2 Score 0 0  PHQ-  9 Score 3         Patient Care Team: Erasmo Downer, MD as PCP - General (Family Medicine) Dasher, Cliffton Asters, MD (Dermatology) Lockie Mola, MD as Referring Physician (Ophthalmology)   Outpatient Medications Prior to Visit  Medication Sig   Cholecalciferol 25 MCG (1000 UT) tablet Take 1 tablet by mouth daily.   hydrochlorothiazide (HYDRODIURIL) 25 MG tablet TAKE 1 TABLET (25 MG TOTAL) BY MOUTH DAILY.   losartan (COZAAR) 50 MG tablet TAKE 1 TABLET BY MOUTH EVERY DAY   MAGNESIUM PO Take 1 capsule by mouth daily. 500mg    nitrofurantoin (MACRODANTIN) 100 MG capsule Take 1 capsule (100 mg total) by mouth daily.   omeprazole (PRILOSEC) 40 MG capsule TAKE 1 CAPSULE (40 MG TOTAL) BY MOUTH DAILY.   Polyethyl Glycol-Propyl Glycol (SYSTANE OP) Apply 1 drop to eye 4 (four) times daily as needed.   TURMERIC PO Take 1 tablet by mouth daily. 500mg    No facility-administered medications prior to visit.    ROS        Objective:     BP 136/80 (BP Location: Left Arm, Patient Position: Sitting, Cuff Size: Normal)   Pulse 83   Temp 97.9 F (36.6 C) (Oral)   Ht 5\' 1"  (1.549 m)   Wt 120 lb 11.2 oz (54.7 kg)   SpO2 95%  BMI 22.81 kg/m    Physical Exam Vitals reviewed.  Constitutional:      General: She is not in acute distress.    Appearance: Normal appearance. She is well-developed. She is not diaphoretic.  HENT:     Head: Normocephalic and atraumatic.  Eyes:     General: No scleral icterus.    Conjunctiva/sclera: Conjunctivae normal.  Neck:     Thyroid: No thyromegaly.  Cardiovascular:     Rate and Rhythm: Normal rate and regular rhythm.     Heart sounds: Normal heart sounds. No murmur heard. Pulmonary:     Effort: Pulmonary effort is normal. No respiratory distress.     Breath sounds: Normal breath sounds. No wheezing, rhonchi or rales.  Musculoskeletal:     Cervical back: Neck supple.     Right lower leg: No edema.     Left lower leg: No edema.   Lymphadenopathy:     Cervical: No cervical adenopathy.  Skin:    General: Skin is warm and dry.     Findings: No rash.  Neurological:     Mental Status: She is alert and oriented to person, place, and time. Mental status is at baseline.  Psychiatric:        Mood and Affect: Mood normal.        Behavior: Behavior normal.      No results found for any visits on 12/23/22.     Assessment & Plan:    Routine Health Maintenance and Physical Exam  Immunization History  Administered Date(s) Administered   Fluad Quad(high Dose 65+) 11/08/2018, 11/17/2019, 11/15/2020, 12/19/2021   Fluad Trivalent(High Dose 65+) 12/23/2022   Influenza, High Dose Seasonal PF 10/31/2016, 11/11/2017   PFIZER(Purple Top)SARS-COV-2 Vaccination 03/25/2019, 04/15/2019, 02/29/2020   Pneumococcal Conjugate-13 11/22/2014   Pneumococcal Polysaccharide-23 01/19/2017   Respiratory Syncytial Virus Vaccine,Recomb Aduvanted(Arexvy) 01/14/2022   Td 01/19/2017   Zoster Recombinant(Shingrix) 12/10/2018, 05/13/2019    Health Maintenance  Topic Date Due   COVID-19 Vaccine (4 - 2023-24 season) 11/09/2022   Medicare Annual Wellness (AWV)  11/18/2023   DTaP/Tdap/Td (2 - Tdap) 01/20/2027   Pneumonia Vaccine 62+ Years old  Completed   INFLUENZA VACCINE  Completed   DEXA SCAN  Completed   Zoster Vaccines- Shingrix  Completed   HPV VACCINES  Aged Out    Discussed health benefits of physical activity, and encouraged her to engage in regular exercise appropriate for her age and condition.  Problem List Items Addressed This Visit       Cardiovascular and Mediastinum   Essential hypertension    Well controlled Continue current medications Recheck metabolic panel F/u in 6 months       Relevant Orders   Comprehensive metabolic panel   Lipid panel   Senile purpura (HCC)    Chronic and stable        Musculoskeletal and Integument   Osteoporosis   Relevant Orders   VITAMIN D 25 Hydroxy (Vit-D Deficiency,  Fractures)     Other   Avitaminosis D   Relevant Orders   VITAMIN D 25 Hydroxy (Vit-D Deficiency, Fractures)   Other Visit Diagnoses     Encounter for annual physical exam    -  Primary   Relevant Orders   Comprehensive metabolic panel   Lipid panel   VITAMIN D 25 Hydroxy (Vit-D Deficiency, Fractures)   Moderate mixed hyperlipidemia not requiring statin therapy       Relevant Orders   Comprehensive metabolic panel   Lipid panel  Long-term use of high-risk medication       Relevant Orders   B12   Magnesium   Imbalance       Relevant Orders   Ambulatory referral to Neurology   Need for influenza vaccination       Relevant Orders   Flu Vaccine Trivalent High Dose (Fluad) (Completed)           Balance Issues Patient reports difficulty with balance, particularly when turning head or walking on uneven surfaces. No dizziness reported. History of polio. Patient has tried physical therapy without significant improvement. -Referral to neurologist for further evaluation.  Foot Pain Intermittent sharp pain on top of foot and at base of toes. Some numbness reported. Pain does not resemble typical neuropathy symptoms. -Check B12 and magnesium levels to rule out deficiency. -Consider arthritis as potential cause of pain.  Knee Pain Soreness in knees after walking, located in the anterior aspect of both knees. -Continue with current exercise regimen and monitor symptoms.  Back Pain Chronic back pain, worsened over the past year. Pain increases with standing or walking for extended periods. Relief with rest and ibuprofen. -Continue current management strategies (rest, ibuprofen as needed). -Consider consultation with orthopedist if pain worsens.  Osteoporosis History of osteoporosis. Patient declined further bone density scans and osteoporosis medications. -Continue current management strategies.  General Health Maintenance -Administer influenza vaccine today. -Order labs  including magnesium, B12, calcium, vitamin D, cholesterol, kidney and liver function. -Continue omeprazole as needed, with awareness of potential malabsorption issues.       Return in about 6 months (around 06/23/2023) for chronic disease f/u.     Shirlee Latch, MD

## 2022-12-23 NOTE — Assessment & Plan Note (Signed)
Well controlled Continue current medications Recheck metabolic panel F/u in 6 months

## 2022-12-24 LAB — COMPREHENSIVE METABOLIC PANEL
ALT: 16 [IU]/L (ref 0–32)
AST: 26 [IU]/L (ref 0–40)
Albumin: 4.6 g/dL (ref 3.7–4.7)
Alkaline Phosphatase: 100 [IU]/L (ref 44–121)
BUN/Creatinine Ratio: 21 (ref 12–28)
BUN: 14 mg/dL (ref 8–27)
Bilirubin Total: 0.4 mg/dL (ref 0.0–1.2)
CO2: 24 mmol/L (ref 20–29)
Calcium: 10.1 mg/dL (ref 8.7–10.3)
Chloride: 92 mmol/L — ABNORMAL LOW (ref 96–106)
Creatinine, Ser: 0.66 mg/dL (ref 0.57–1.00)
Globulin, Total: 2.6 g/dL (ref 1.5–4.5)
Glucose: 101 mg/dL — ABNORMAL HIGH (ref 70–99)
Potassium: 4.2 mmol/L (ref 3.5–5.2)
Sodium: 132 mmol/L — ABNORMAL LOW (ref 134–144)
Total Protein: 7.2 g/dL (ref 6.0–8.5)
eGFR: 86 mL/min/{1.73_m2} (ref >59)

## 2022-12-24 LAB — MAGNESIUM: Magnesium: 1.9 mg/dL (ref 1.6–2.3)

## 2022-12-24 LAB — LIPID PANEL
Chol/HDL Ratio: 2.9 {ratio} (ref 0.0–4.4)
Cholesterol, Total: 293 mg/dL — ABNORMAL HIGH (ref 100–199)
HDL: 102 mg/dL (ref >39)
LDL Chol Calc (NIH): 177 mg/dL — ABNORMAL HIGH (ref 0–99)
Triglycerides: 86 mg/dL (ref 0–149)
VLDL Cholesterol Cal: 14 mg/dL (ref 5–40)

## 2022-12-24 LAB — VITAMIN D 25 HYDROXY (VIT D DEFICIENCY, FRACTURES): Vit D, 25-Hydroxy: 63.9 ng/mL (ref 30.0–100.0)

## 2022-12-24 LAB — VITAMIN B12: Vitamin B-12: 603 pg/mL (ref 232–1245)

## 2023-01-01 ENCOUNTER — Ambulatory Visit
Admission: RE | Admit: 2023-01-01 | Discharge: 2023-01-01 | Disposition: A | Discharge status: Home / Self Care | Payer: Medicare HMO | Source: Ambulatory Visit | Attending: Family Medicine | Admitting: Family Medicine

## 2023-01-01 DIAGNOSIS — Z1231 Encounter for screening mammogram for malignant neoplasm of breast: Secondary | ICD-10-CM | POA: Insufficient documentation

## 2023-01-16 ENCOUNTER — Other Ambulatory Visit: Payer: Self-pay | Admitting: Family Medicine

## 2023-01-16 NOTE — Telephone Encounter (Signed)
Requested Prescriptions  Pending Prescriptions Disp Refills   losartan (COZAAR) 50 MG tablet [Pharmacy Med Name: LOSARTAN POTASSIUM 50 MG TAB] 90 tablet 1    Sig: TAKE 1 TABLET BY MOUTH EVERY DAY     Cardiovascular:  Angiotensin Receptor Blockers Passed - 01/16/2023  1:38 AM      Passed - Cr in normal range and within 180 days    Creatinine, Ser  Date Value Ref Range Status  12/23/2022 0.66 0.57 - 1.00 mg/dL Final         Passed - K in normal range and within 180 days    Potassium  Date Value Ref Range Status  12/23/2022 4.2 3.5 - 5.2 mmol/L Final         Passed - Patient is not pregnant      Passed - Last BP in normal range    BP Readings from Last 1 Encounters:  12/23/22 136/80         Passed - Valid encounter within last 6 months    Recent Outpatient Visits           3 weeks ago Encounter for annual physical exam   [facility] [place], Marzella Schlein, MD   6 months ago Essential hypertension   [place] [facility] [place], Marzella Schlein, MD   1 year ago Encounter for subsequent annual wellness visit in Medicare patient   [facility] [place], Marzella Schlein, MD   1 year ago UTI symptoms   [facility] Caro Laroche, DO   1 year ago Essential hypertension   [place] [facility] [place], Marzella Schlein, MD       Future Appointments             In 2 months MacDiarmid, Lorin Picket, MD [facility] [place]   In 5 months Bacigalupo, Marzella Schlein, MD [facility]

## 2023-01-21 DIAGNOSIS — M9904 Segmental and somatic dysfunction of sacral region: Secondary | ICD-10-CM | POA: Diagnosis not present

## 2023-01-21 DIAGNOSIS — M545 Low back pain, unspecified: Secondary | ICD-10-CM | POA: Diagnosis not present

## 2023-01-21 DIAGNOSIS — M9905 Segmental and somatic dysfunction of pelvic region: Secondary | ICD-10-CM | POA: Diagnosis not present

## 2023-01-21 DIAGNOSIS — M9903 Segmental and somatic dysfunction of lumbar region: Secondary | ICD-10-CM | POA: Diagnosis not present

## 2023-03-30 DIAGNOSIS — H35342 Macular cyst, hole, or pseudohole, left eye: Secondary | ICD-10-CM | POA: Diagnosis not present

## 2023-03-30 DIAGNOSIS — H43811 Vitreous degeneration, right eye: Secondary | ICD-10-CM | POA: Diagnosis not present

## 2023-03-30 DIAGNOSIS — Z961 Presence of intraocular lens: Secondary | ICD-10-CM | POA: Diagnosis not present

## 2023-03-30 DIAGNOSIS — H2511 Age-related nuclear cataract, right eye: Secondary | ICD-10-CM | POA: Diagnosis not present

## 2023-04-06 ENCOUNTER — Ambulatory Visit: Payer: Medicare HMO | Admitting: Urology

## 2023-04-13 DIAGNOSIS — R2689 Other abnormalities of gait and mobility: Secondary | ICD-10-CM | POA: Diagnosis not present

## 2023-04-13 DIAGNOSIS — R29898 Other symptoms and signs involving the musculoskeletal system: Secondary | ICD-10-CM | POA: Diagnosis not present

## 2023-04-15 DIAGNOSIS — R2689 Other abnormalities of gait and mobility: Secondary | ICD-10-CM | POA: Diagnosis not present

## 2023-04-15 DIAGNOSIS — R29898 Other symptoms and signs involving the musculoskeletal system: Secondary | ICD-10-CM | POA: Diagnosis not present

## 2023-04-20 DIAGNOSIS — M9905 Segmental and somatic dysfunction of pelvic region: Secondary | ICD-10-CM | POA: Diagnosis not present

## 2023-04-20 DIAGNOSIS — M545 Low back pain, unspecified: Secondary | ICD-10-CM | POA: Diagnosis not present

## 2023-04-20 DIAGNOSIS — M9903 Segmental and somatic dysfunction of lumbar region: Secondary | ICD-10-CM | POA: Diagnosis not present

## 2023-04-20 DIAGNOSIS — M9904 Segmental and somatic dysfunction of sacral region: Secondary | ICD-10-CM | POA: Diagnosis not present

## 2023-04-22 DIAGNOSIS — D2271 Melanocytic nevi of right lower limb, including hip: Secondary | ICD-10-CM | POA: Diagnosis not present

## 2023-04-22 DIAGNOSIS — L570 Actinic keratosis: Secondary | ICD-10-CM | POA: Diagnosis not present

## 2023-04-22 DIAGNOSIS — C4371 Malignant melanoma of right lower limb, including hip: Secondary | ICD-10-CM | POA: Diagnosis not present

## 2023-04-22 DIAGNOSIS — D2261 Melanocytic nevi of right upper limb, including shoulder: Secondary | ICD-10-CM | POA: Diagnosis not present

## 2023-04-22 DIAGNOSIS — Z85828 Personal history of other malignant neoplasm of skin: Secondary | ICD-10-CM | POA: Diagnosis not present

## 2023-04-22 DIAGNOSIS — Z08 Encounter for follow-up examination after completed treatment for malignant neoplasm: Secondary | ICD-10-CM | POA: Diagnosis not present

## 2023-04-22 DIAGNOSIS — D485 Neoplasm of uncertain behavior of skin: Secondary | ICD-10-CM | POA: Diagnosis not present

## 2023-04-22 DIAGNOSIS — D2272 Melanocytic nevi of left lower limb, including hip: Secondary | ICD-10-CM | POA: Diagnosis not present

## 2023-04-22 DIAGNOSIS — Z86006 Personal history of melanoma in-situ: Secondary | ICD-10-CM | POA: Diagnosis not present

## 2023-04-22 DIAGNOSIS — L821 Other seborrheic keratosis: Secondary | ICD-10-CM | POA: Diagnosis not present

## 2023-04-22 DIAGNOSIS — D2262 Melanocytic nevi of left upper limb, including shoulder: Secondary | ICD-10-CM | POA: Diagnosis not present

## 2023-04-22 DIAGNOSIS — D225 Melanocytic nevi of trunk: Secondary | ICD-10-CM | POA: Diagnosis not present

## 2023-04-22 DIAGNOSIS — D235 Other benign neoplasm of skin of trunk: Secondary | ICD-10-CM | POA: Diagnosis not present

## 2023-04-22 DIAGNOSIS — Z85820 Personal history of malignant melanoma of skin: Secondary | ICD-10-CM | POA: Diagnosis not present

## 2023-04-26 ENCOUNTER — Other Ambulatory Visit: Payer: Self-pay | Admitting: Family Medicine

## 2023-04-26 DIAGNOSIS — I10 Essential (primary) hypertension: Secondary | ICD-10-CM

## 2023-04-27 NOTE — Telephone Encounter (Signed)
 Requested Prescriptions  Pending Prescriptions Disp Refills   hydrochlorothiazide (HYDRODIURIL) 25 MG tablet [Pharmacy Med Name: HYDROCHLOROTHIAZIDE 25 MG TAB] 90 tablet 0    Sig: TAKE 1 TABLET (25 MG TOTAL) BY MOUTH DAILY.     Cardiovascular: Diuretics - Thiazide Failed - 04/27/2023  3:22 PM      Failed - Na in normal range and within 180 days    Sodium  Date Value Ref Range Status  12/23/2022 132 (L) 134 - 144 mmol/L Final         Passed - Cr in normal range and within 180 days    Creatinine, Ser  Date Value Ref Range Status  12/23/2022 0.66 0.57 - 1.00 mg/dL Final         Passed - K in normal range and within 180 days    Potassium  Date Value Ref Range Status  12/23/2022 4.2 3.5 - 5.2 mmol/L Final         Passed - Last BP in normal range    BP Readings from Last 1 Encounters:  12/23/22 136/80         Passed - Valid encounter within last 6 months    Recent Outpatient Visits           4 months ago Encounter for annual physical exam   [place] [place], Marzella Schlein, MD   10 months ago Essential hypertension   [facility] [place], Marzella Schlein, MD   1 year ago Encounter for subsequent annual wellness visit in Medicare patient   [facility] [place], Marzella Schlein, MD   1 year ago UTI symptoms   [place] [facility] Caro Laroche, DO   1 year ago Essential hypertension   [facility] [place], Marzella Schlein, MD       Future Appointments             In 1 month Bacigalupo, Marzella Schlein, MD [facility]

## 2023-05-12 DIAGNOSIS — R2681 Unsteadiness on feet: Secondary | ICD-10-CM | POA: Diagnosis not present

## 2023-05-12 DIAGNOSIS — R200 Anesthesia of skin: Secondary | ICD-10-CM | POA: Diagnosis not present

## 2023-05-27 DIAGNOSIS — D0371 Melanoma in situ of right lower limb, including hip: Secondary | ICD-10-CM | POA: Diagnosis not present

## 2023-05-27 DIAGNOSIS — C4371 Malignant melanoma of right lower limb, including hip: Secondary | ICD-10-CM | POA: Diagnosis not present

## 2023-06-23 ENCOUNTER — Ambulatory Visit: Payer: Self-pay | Admitting: Family Medicine

## 2023-06-23 ENCOUNTER — Encounter: Payer: Self-pay | Admitting: Family Medicine

## 2023-06-23 VITALS — BP 121/75 | HR 91 | Resp 10 | Ht 61.5 in | Wt 117.0 lb

## 2023-06-23 DIAGNOSIS — I10 Essential (primary) hypertension: Secondary | ICD-10-CM

## 2023-06-23 DIAGNOSIS — R739 Hyperglycemia, unspecified: Secondary | ICD-10-CM | POA: Diagnosis not present

## 2023-06-23 DIAGNOSIS — N3020 Other chronic cystitis without hematuria: Secondary | ICD-10-CM

## 2023-06-23 DIAGNOSIS — C439 Malignant melanoma of skin, unspecified: Secondary | ICD-10-CM | POA: Diagnosis not present

## 2023-06-23 DIAGNOSIS — E785 Hyperlipidemia, unspecified: Secondary | ICD-10-CM | POA: Insufficient documentation

## 2023-06-23 DIAGNOSIS — Z87440 Personal history of urinary (tract) infections: Secondary | ICD-10-CM | POA: Insufficient documentation

## 2023-06-23 DIAGNOSIS — E782 Mixed hyperlipidemia: Secondary | ICD-10-CM | POA: Diagnosis not present

## 2023-06-23 DIAGNOSIS — D692 Other nonthrombocytopenic purpura: Secondary | ICD-10-CM | POA: Diagnosis not present

## 2023-06-23 MED ORDER — LOSARTAN POTASSIUM 50 MG PO TABS
50.0000 mg | ORAL_TABLET | Freq: Every day | ORAL | 3 refills | Status: DC
Start: 2023-06-23 — End: 2023-12-08

## 2023-06-23 MED ORDER — NITROFURANTOIN MACROCRYSTAL 100 MG PO CAPS: 100.0000 mg | ORAL_CAPSULE | Freq: Every day | ORAL | 3 refills | Status: AC

## 2023-06-23 MED ORDER — HYDROCHLOROTHIAZIDE 25 MG PO TABS: 25.0000 mg | ORAL_TABLET | Freq: Every day | ORAL | 3 refills | Status: DC

## 2023-06-23 NOTE — Progress Notes (Signed)
 Established patient visit   Patient: Brandy Wong   DOB: Aug 20, 1934   89 y.o. Female  MRN: [id number] Visit Date: 06/23/2023  Today's healthcare provider: Shirlee Latch, MD   Chief Complaint  Patient presents with   Medical Management of Chronic Issues   Hypertension    Pt monitors at home with readings avg between 113-136/65-79.    Subjective    Hypertension   HPI     Hypertension    Additional comments: Pt monitors at home with readings avg between 113-136/65-79.       Last edited by Acey Lav, CMA on 06/23/2023 10:33 AM.       Discussed the use of AI scribe software for clinical note transcription with the patient, who gave verbal consent to proceed.  History of Present Illness   The patient, with a history of hypertension and UTI, presents for a routine follow-up visit. She reports increased fatigue, stating she has to pace her day based on her energy levels. She also mentions difficulty walking and a decrease in overall energy. The patient has been seeing a Systems analyst as recommended by a neurologist, but found the sessions too exhausting and has decided to continue exercises at home.  The patient also reports balance issues, which she has been managing with home exercises and yoga. She expresses concern about her balance, but acknowledges some improvement with the exercises.  The patient recently had a melanoma removed from her leg, the third such occurrence. She expresses concern about the invasive nature of the melanoma and the potential for future occurrences. She is scheduled for regular check-ups with her dermatologist.  The patient also mentions a potential diagnosis of post-polio syndrome, for which she is awaiting final consultation. She expresses uncertainty about the potential treatment options for this condition.  The patient is on medication for UTI prevention, which she reports has been effective. She requests a refill of her  prescription, as her urologist would not refill it without a check-up. She also mentions taking Biotin for thinning hair.         Medications: Outpatient Medications Prior to Visit  Medication Sig   Cholecalciferol 25 MCG (1000 UT) tablet Take 1 tablet by mouth daily.   MAGNESIUM PO Take 1 capsule by mouth daily. 500mg    omeprazole (PRILOSEC) 40 MG capsule TAKE 1 CAPSULE (40 MG TOTAL) BY MOUTH DAILY.   Polyethyl Glycol-Propyl Glycol (SYSTANE OP) Apply 1 drop to eye 4 (four) times daily as needed.   TURMERIC PO Take 1 tablet by mouth daily. 500mg    [DISCONTINUED] hydrochlorothiazide (HYDRODIURIL) 25 MG tablet TAKE 1 TABLET (25 MG TOTAL) BY MOUTH DAILY.   [DISCONTINUED] losartan (COZAAR) 50 MG tablet TAKE 1 TABLET BY MOUTH EVERY DAY   [DISCONTINUED] nitrofurantoin (MACRODANTIN) 100 MG capsule Take 1 capsule (100 mg total) by mouth daily. (Patient not taking: Reported on 06/23/2023)   No facility-administered medications prior to visit.    Review of Systems     Objective    BP 121/75 Comment: home reading  Pulse 91   Resp 10   Ht 5' 1.5" (1.562 m)   Wt 117 lb (53.1 kg)   BMI 21.75 kg/m    Physical Exam Vitals reviewed.  Constitutional:      General: She is not in acute distress.    Appearance: Normal appearance. She is well-developed. She is not diaphoretic.  HENT:     Head: Normocephalic and atraumatic.  Eyes:     General: No  scleral icterus.    Conjunctiva/sclera: Conjunctivae normal.  Neck:     Thyroid: No thyromegaly.  Cardiovascular:     Rate and Rhythm: Normal rate and regular rhythm.     Heart sounds: Normal heart sounds. No murmur heard. Pulmonary:     Effort: Pulmonary effort is normal. No respiratory distress.     Breath sounds: Normal breath sounds. No wheezing, rhonchi or rales.  Musculoskeletal:     Cervical back: Neck supple.     Right lower leg: No edema.     Left lower leg: No edema.  Lymphadenopathy:     Cervical: No cervical adenopathy.   Skin:    General: Skin is warm and dry.     Findings: No rash.  Neurological:     Mental Status: She is alert and oriented to person, place, and time. Mental status is at baseline.  Psychiatric:        Mood and Affect: Mood normal.        Behavior: Behavior normal.      No results found for any visits on 06/23/23.  Assessment & Plan     Problem List Items Addressed This Visit       Cardiovascular and Mediastinum   Essential hypertension - Primary   Her blood pressure is well-controlled with hydrochlorothiazide 25mg  daily and losartan 50 mg daily. Home readings are consistently under 140 mmHg, indicating effective management of hypertension. - Refill hydrochlorothiazide and losartan prescriptions      Relevant Medications   hydrochlorothiazide (HYDRODIURIL) 25 MG tablet   losartan (COZAAR) 50 MG tablet   Other Relevant Orders   Comprehensive metabolic panel with GFR   Lipid panel   Senile purpura (HCC)   Chronic and stable      Relevant Medications   hydrochlorothiazide (HYDRODIURIL) 25 MG tablet   losartan (COZAAR) 50 MG tablet     Musculoskeletal and Integument   Melanoma of skin (HCC)   She had a third melanoma removed from her leg, identified as invasive. She is concerned about further melanoma development and the implications of invasive melanoma. She is aware of the need for regular dermatology follow-ups and self-examination of lymph nodes. - Perform full lymph node check during the visit - Educate on self-examination of lymph nodes - Continue regular dermatology follow-ups every six months      Relevant Medications   nitrofurantoin (MACRODANTIN) 100 MG capsule     Genitourinary   Chronic cystitis   She has been on nitrofurantoin for UTI prophylaxis and has not experienced any UTIs since starting the medication. She prefers to continue the medication under the care of the family practice provider. No longer seeing urology - Refill nitrofurantoin  prescription      Relevant Medications   nitrofurantoin (MACRODANTIN) 100 MG capsule     Other   Hyperlipidemia   Reviewed last lipid panel Not currently on a statin Recheck FLP and CMP Discussed diet and exercise       Relevant Medications   hydrochlorothiazide (HYDRODIURIL) 25 MG tablet   losartan (COZAAR) 50 MG tablet   Other Relevant Orders   Comprehensive metabolic panel with GFR   Lipid panel   History of recurrent UTIs   Other Visit Diagnoses       Hyperglycemia       Relevant Orders   Hemoglobin A1c           Post-Polio Syndrome She experiences fatigue and decreased energy levels, consistent with post-polio syndrome as suggested by Dr. Sherryll Burger.  She found personal training sessions too exhausting and has decided to continue exercises at home. She reports difficulty walking and increased fatigue, requiring pacing of daily activities. - Continue home exercises learned from personal trainer  Balance Issues She reports ongoing balance issues, which have not improved significantly with exercises. Balance can be affected by vision, muscle strength, inner ear function, and sensation in the feet. - Continue home balance exercises  Insomnia She reports difficulty sleeping, which was previously improved with nitrofurantoin, although it is not intended for this use. She plans to resume nitrofurantoin for UTI prophylaxis and will monitor any effects on sleep. - Monitor sleep patterns after resuming nitrofurantoin  General Health Maintenance She is taking biotin for hair thinning and is advised to avoid taking it before certain lab tests as it can interfere with results. She is also engaging in yoga and breathing exercises, which are beneficial for overall health. - Avoid biotin before vitamin or thyroid lab tests - Continue yoga and breathing exercises  Goals of Care She prefers to avoid aggressive treatments such as chemotherapy in the event of a cancer diagnosis,  prioritizing quality of life over aggressive interventions. She is willing to consult an oncologist if necessary but prefers not to undergo chemotherapy, based on her past experience with her husband's treatment. - Respect her wishes regarding treatment preferences  Follow-up She will have a follow-up appointment for a physical in six months and will have labs drawn today to monitor cholesterol, kidney and liver function, and blood sugar. - Schedule physical in six months - Perform lab tests for cholesterol, kidney and liver function, and blood sugar today        Return in about 6 months (around 12/23/2023) for CPE.       Aden Agreste, MD  [facility] [phone] (phone) [phone] (fax)  [facility]

## 2023-06-23 NOTE — Assessment & Plan Note (Signed)
 She had a third melanoma removed from her leg, identified as invasive. She is concerned about further melanoma development and the implications of invasive melanoma. She is aware of the need for regular dermatology follow-ups and self-examination of lymph nodes. - Perform full lymph node check during the visit - Educate on self-examination of lymph nodes - Continue regular dermatology follow-ups every six months

## 2023-06-23 NOTE — Assessment & Plan Note (Signed)
 Chronic and stable.

## 2023-06-23 NOTE — Assessment & Plan Note (Signed)
 She has been on nitrofurantoin for UTI prophylaxis and has not experienced any UTIs since starting the medication. She prefers to continue the medication under the care of the family practice provider. No longer seeing urology - Refill nitrofurantoin prescription

## 2023-06-23 NOTE — Assessment & Plan Note (Signed)
 Reviewed last lipid panel Not currently on a statin Recheck FLP and CMP Discussed diet and exercise

## 2023-06-23 NOTE — Assessment & Plan Note (Signed)
 Her blood pressure is well-controlled with hydrochlorothiazide 25mg  daily and losartan 50 mg daily. Home readings are consistently under 140 mmHg, indicating effective management of hypertension. - Refill hydrochlorothiazide and losartan prescriptions

## 2023-06-24 LAB — COMPREHENSIVE METABOLIC PANEL WITH GFR
ALT: 16 IU/L (ref 0–32)
AST: 26 IU/L (ref 0–40)
Albumin: 4.7 g/dL (ref 3.7–4.7)
Alkaline Phosphatase: 97 IU/L (ref 44–121)
BUN/Creatinine Ratio: 15 (ref 12–28)
BUN: 13 mg/dL (ref 8–27)
Bilirubin Total: 0.2 mg/dL (ref 0.0–1.2)
CO2: 22 mmol/L (ref 20–29)
Calcium: 10.3 mg/dL (ref 8.7–10.3)
Chloride: 91 mmol/L — ABNORMAL LOW (ref 96–106)
Creatinine, Ser: 0.85 mg/dL (ref 0.57–1.00)
Globulin, Total: 2.6 g/dL (ref 1.5–4.5)
Glucose: 93 mg/dL (ref 70–99)
Potassium: 4.1 mmol/L (ref 3.5–5.2)
Sodium: 131 mmol/L — ABNORMAL LOW (ref 134–144)
Total Protein: 7.3 g/dL (ref 6.0–8.5)
eGFR: 67 mL/min/{1.73_m2} (ref >59)

## 2023-06-24 LAB — LIPID PANEL
Chol/HDL Ratio: 3 ratio (ref 0.0–4.4)
Cholesterol, Total: 288 mg/dL — ABNORMAL HIGH (ref 100–199)
HDL: 95 mg/dL (ref >39)
LDL Chol Calc (NIH): 175 mg/dL — ABNORMAL HIGH (ref 0–99)
Triglycerides: 110 mg/dL (ref 0–149)
VLDL Cholesterol Cal: 18 mg/dL (ref 5–40)

## 2023-06-24 LAB — HEMOGLOBIN A1C
Est. average glucose Bld gHb Est-mCnc: 114 mg/dL
Hgb A1c MFr Bld: 5.6 % (ref 4.8–5.6)

## 2023-06-25 ENCOUNTER — Encounter: Payer: Self-pay | Admitting: Family Medicine

## 2023-07-14 DIAGNOSIS — R2689 Other abnormalities of gait and mobility: Secondary | ICD-10-CM | POA: Diagnosis not present

## 2023-07-14 DIAGNOSIS — M6250 Muscle wasting and atrophy, not elsewhere classified, unspecified site: Secondary | ICD-10-CM | POA: Diagnosis not present

## 2023-07-14 DIAGNOSIS — R5383 Other fatigue: Secondary | ICD-10-CM | POA: Diagnosis not present

## 2023-07-14 DIAGNOSIS — M810 Age-related osteoporosis without current pathological fracture: Secondary | ICD-10-CM | POA: Diagnosis not present

## 2023-07-14 DIAGNOSIS — Z8612 Personal history of poliomyelitis: Secondary | ICD-10-CM | POA: Diagnosis not present

## 2023-07-14 DIAGNOSIS — G629 Polyneuropathy, unspecified: Secondary | ICD-10-CM | POA: Diagnosis not present

## 2023-07-15 ENCOUNTER — Ambulatory Visit: Payer: Self-pay

## 2023-07-15 NOTE — Telephone Encounter (Signed)
 Chief Complaint: menopause symptoms Symptoms: vaginal dryness and hot flashes Frequency: years Disposition: [] ED /[] Urgent Care (no appt availability in office) / [] Appointment(In office/virtual)/ []  [facility]/ [] Home Care/ [] Refused Recommended Disposition /[] [facility] [place] [facility]/ [x]  Follow-up with PCP Additional Notes: pt states that she is wanting a prescription for Estradiol vaginal cream for her Hot flashes. Pt states that she recently saw PCP and PCP is aware of situation and does not want to be seen.  Pt states vaginal dryness and bladder issues and wants a prescription for it. States she used to take the pills years ago but is requesting the vaginal cream.    Copied from CRM [id number]. Topic: Clinical - Medication Question >> Jul 15, 2023  8:22 AM Opal Bill wrote: Reason for CRM: Pt wanting to speak to the nurse or or assistant about ESTRADIOL VAGINAL CREAM 0.01%. Please follow up. Reason for Disposition  [1] Caller has NON-URGENT question AND [2] triager unable to answer question (e.g., questions about hormone replacement therapy or alternative treatments)  Protocols used: Menopause Symptoms and Questions-A-AH

## 2023-07-22 NOTE — Telephone Encounter (Signed)
 LVMTCB. Ok for E2C2 to advise per Dr.Pardue If patient still does not want appt please make aware pcp will not be in office until tomorrow

## 2023-07-23 NOTE — Telephone Encounter (Signed)
 Appt scheduled to see [place] 5/20 @ 1pm she is requesting a vaginal cream to assist with hot flashes

## 2023-07-23 NOTE — Telephone Encounter (Signed)
 Copied from CRM [id number]. Topic: Appointments - Appointment Scheduling >> Jul 22, 2023  3:16 PM Lotus Round B wrote: Patient/patient representative is calling to schedule an appointment. Pt stated that she doesn't mind having an appt . If there any cancellations and openings avaliable for either tomorrow morning 07/23/2023 or any time Friday 07/24/2023 to please give her a call.

## 2023-07-28 ENCOUNTER — Ambulatory Visit: Admitting: Physician Assistant

## 2023-07-28 ENCOUNTER — Encounter: Payer: Self-pay | Admitting: Physician Assistant

## 2023-07-28 VITALS — BP 137/63 | HR 76 | Resp 16 | Ht 61.0 in | Wt 120.0 lb

## 2023-07-28 DIAGNOSIS — I10 Essential (primary) hypertension: Secondary | ICD-10-CM

## 2023-07-28 DIAGNOSIS — R232 Flushing: Secondary | ICD-10-CM

## 2023-07-28 DIAGNOSIS — K219 Gastro-esophageal reflux disease without esophagitis: Secondary | ICD-10-CM

## 2023-07-28 DIAGNOSIS — Z87440 Personal history of urinary (tract) infections: Secondary | ICD-10-CM

## 2023-07-28 DIAGNOSIS — Z853 Personal history of malignant neoplasm of breast: Secondary | ICD-10-CM

## 2023-07-28 NOTE — Progress Notes (Signed)
 " Established patient visit  Patient: Brandy Wong   DOB: Feb 07, 1935   89 y.o. Female  MRN: [id number] Visit Date: 07/28/2023  Today's healthcare provider: Jolynn Spencer, PA-C   Chief Complaint  Patient presents with   Hot Flashes    Hot flashes wants Vag cream    Subjective      Discussed the use of AI scribe software for clinical note transcription with the patient, who gave verbal consent to proceed.  History of Present Illness Brandy Wong is an 89 year old female with a history of breast cancer who presents with hot flashes.  She experiences hot flashes that began twelve years ago after stopping hormone therapy. These occur every other day to four or five times daily and disrupt her sleep. During winter, she sometimes goes to the garage to cool down. She previously found relief with estradiol .  She takes nitrofurantoin  daily for recurrent UTIs, which she has been on for about a year. She also takes two medications for blood pressure, omeprazole  for acid reflux, and supplements including turmeric, magnesium, and cholecalciferol.  She sleeps poorly, averaging four hours per night, and has difficulty returning to sleep after waking. She experiences dryness in her skin, hair, nose, and ears.       12/23/2022   10:42 AM 11/18/2022    3:46 PM 12/19/2021   10:50 AM  Depression screen PHQ 2/9  Decreased Interest 0 0 0  Down, Depressed, Hopeless 0 0 0  PHQ - 2 Score 0 0 0  Altered sleeping 0  3  Tired, decreased energy 3  1  Change in appetite 0  0  Feeling bad or failure about yourself  0  0  Trouble concentrating 0  0  Moving slowly or fidgety/restless 0  0  Suicidal thoughts 0  0  PHQ-9 Score 3  4  Difficult doing work/chores Not difficult at all  Not difficult at all      12/23/2022   10:45 AM  GAD 7 : Generalized Anxiety Score  Nervous, Anxious, on Edge 0  Control/stop worrying 0  Worry too much - different things 0  Trouble relaxing 0  Restless 0  Easily  annoyed or irritable 0  Afraid - awful might happen 0  Total GAD 7 Score 0  Anxiety Difficulty Not difficult at all    Medications: Outpatient Medications Prior to Visit  Medication Sig   Cholecalciferol 25 MCG (1000 UT) tablet Take 1 tablet by mouth daily.   hydrochlorothiazide  (HYDRODIURIL ) 25 MG tablet Take 1 tablet (25 mg total) by mouth daily.   losartan  (COZAAR ) 50 MG tablet Take 1 tablet (50 mg total) by mouth daily.   MAGNESIUM PO Take 1 capsule by mouth daily. 500mg    nitrofurantoin  (MACRODANTIN ) 100 MG capsule Take 1 capsule (100 mg total) by mouth daily.   omeprazole  (PRILOSEC) 40 MG capsule TAKE 1 CAPSULE (40 MG TOTAL) BY MOUTH DAILY.   Polyethyl Glycol-Propyl Glycol (SYSTANE OP) Apply 1 drop to eye 4 (four) times daily as needed.   TURMERIC PO Take 1 tablet by mouth daily. 500mg    No facility-administered medications prior to visit.    Review of Systems All negative Except see HPI       Objective    BP 137/63 (BP Location: Left Arm, Patient Position: Sitting, Cuff Size: Normal)   Pulse 76   Resp 16   Ht 5' 1 (1.549 m)   Wt 120 lb (54.4 kg)   SpO2 100%  BMI 22.67 kg/m     Physical Exam Constitutional:      General: She is not in acute distress.    Appearance: Normal appearance.  HENT:     Head: Normocephalic.  Pulmonary:     Effort: Pulmonary effort is normal. No respiratory distress.  Neurological:     Mental Status: She is alert and oriented to person, place, and time. Mental status is at baseline.      No results found for any visits on 07/28/23.      Assessment & Plan Hot flashes Chronic hot flashes for 12 years post-hormone therapy. Interested in estradiol  but aware of risks due to age and breast cancer history. Discussed paroxetine as a safer alternative. - Discuss with Dr. B regarding estradiol  or Premarin considering quality of life. - Consider paroxetine 7.5 mg daily for hot flashes if approved by Dr. B  Breast cancer History of  breast cancer 20 years ago affects hormone therapy decisions.  Recurrent urinary tract infections Recurrent UTIs managed with nitrofurantoin . Interested in discontinuing if estradiol  is prescribed. - Discuss with Dr. B the potential to discontinue nitrofurantoin  if estradiol  is prescribed.  Hypertension Chronic, unstable Could be due to white coat syndrome Hypertension managed with two medications. Blood pressure fluctuates but not a major concern. - Continue current antihypertensive regimen hydrochlorothiazide  25, losartan  50mg . Will follow-up  Gastro-esophageal reflux disease Chronic GERD managed with omeprazole  40mg . Advised lifestyle modifications Will follow-up  No orders of the defined types were placed in this encounter.   No follow-ups on file.   The patient was advised to call back or seek an in-person evaluation if the symptoms worsen or if the condition fails to improve as anticipated.  I discussed the assessment and treatment plan with the patient. The patient was provided an opportunity to ask questions and all were answered. The patient agreed with the plan and demonstrated an understanding of the instructions.  I, Manya Balash, PA-C have reviewed all documentation for this visit. The documentation on 07/28/2023  for the exam, diagnosis, procedures, and orders are all accurate and complete.  Jolynn Spencer, [facility], MMS [facility] [phone] (phone) [phone] (fax)  [facility] "

## 2023-08-01 ENCOUNTER — Encounter: Payer: Self-pay | Admitting: Family Medicine

## 2023-08-01 DIAGNOSIS — Z780 Asymptomatic menopausal state: Secondary | ICD-10-CM

## 2023-08-01 DIAGNOSIS — R232 Flushing: Secondary | ICD-10-CM

## 2023-08-04 DIAGNOSIS — R232 Flushing: Secondary | ICD-10-CM | POA: Insufficient documentation

## 2023-08-04 MED ORDER — VEOZAH 45 MG PO TABS: 45.0000 mg | ORAL_TABLET | Freq: Every day | ORAL | 0 refills | Status: AC

## 2023-08-04 NOTE — Telephone Encounter (Signed)
Please see the pt message below

## 2023-08-05 ENCOUNTER — Other Ambulatory Visit (HOSPITAL_COMMUNITY): Payer: Self-pay

## 2023-08-05 ENCOUNTER — Telehealth: Payer: Self-pay

## 2023-08-05 NOTE — Telephone Encounter (Signed)
 Pharmacy Patient Advocate Encounter  Received notification from [employer] that Prior Authorization for Veozah 45MG  tablets has been APPROVED from 03/11/23 to 03/09/24. Ran test claim, Copay is $0. This test claim was processed through [facility]- copay amounts may vary at other pharmacies due to pharmacy/plan contracts, or as the patient moves through the different stages of their insurance plan.   PA #/Case ID/Reference #: [id number]

## 2023-08-05 NOTE — Telephone Encounter (Signed)
 Pharmacy Patient Advocate Encounter   Received notification from Onbase that prior authorization for Veozah 45MG  tablets is required/requested.   Insurance verification completed.   The patient is insured through [employer] .   Per test claim: PA required; PA submitted to above mentioned insurance via CoverMyMeds Key/confirmation #/EOC [facility] Status is pending

## 2023-08-12 DIAGNOSIS — Z87891 Personal history of nicotine dependence: Secondary | ICD-10-CM | POA: Diagnosis not present

## 2023-08-12 DIAGNOSIS — E785 Hyperlipidemia, unspecified: Secondary | ICD-10-CM | POA: Diagnosis not present

## 2023-08-12 DIAGNOSIS — K219 Gastro-esophageal reflux disease without esophagitis: Secondary | ICD-10-CM | POA: Diagnosis not present

## 2023-08-12 DIAGNOSIS — M1990 Unspecified osteoarthritis, unspecified site: Secondary | ICD-10-CM | POA: Diagnosis not present

## 2023-08-12 DIAGNOSIS — Z8249 Family history of ischemic heart disease and other diseases of the circulatory system: Secondary | ICD-10-CM | POA: Diagnosis not present

## 2023-08-12 DIAGNOSIS — D6949 Other primary thrombocytopenia: Secondary | ICD-10-CM | POA: Diagnosis not present

## 2023-08-12 DIAGNOSIS — I129 Hypertensive chronic kidney disease with stage 1 through stage 4 chronic kidney disease, or unspecified chronic kidney disease: Secondary | ICD-10-CM | POA: Diagnosis not present

## 2023-08-12 DIAGNOSIS — M810 Age-related osteoporosis without current pathological fracture: Secondary | ICD-10-CM | POA: Diagnosis not present

## 2023-08-12 DIAGNOSIS — M545 Low back pain, unspecified: Secondary | ICD-10-CM | POA: Diagnosis not present

## 2023-08-12 DIAGNOSIS — Z9181 History of falling: Secondary | ICD-10-CM | POA: Diagnosis not present

## 2023-08-12 DIAGNOSIS — Z823 Family history of stroke: Secondary | ICD-10-CM | POA: Diagnosis not present

## 2023-08-12 DIAGNOSIS — F329 Major depressive disorder, single episode, unspecified: Secondary | ICD-10-CM | POA: Diagnosis not present

## 2023-08-31 ENCOUNTER — Telehealth: Payer: Self-pay

## 2023-08-31 NOTE — Telephone Encounter (Signed)
 Called patient and offered appointment with Dr.Pardue on Thursday 05/26 patient refused. Please review and advise

## 2023-08-31 NOTE — Telephone Encounter (Signed)
 Copied from CRM [id number]. Topic: Clinical - Medication Question >> Aug 31, 2023  1:21 PM Sophia H wrote: Reason for CRM: Patient states she is pretty sure she has a UTI, wanting to know if Dr. B can please call in a prescription for her ASAP.  Symptoms - frequent trips to restroom. Please advise # [phone]    [id number] [place], [place] - [address]

## 2023-08-31 NOTE — Telephone Encounter (Signed)
Agree that she should be seen.

## 2023-09-03 ENCOUNTER — Ambulatory Visit: Admitting: Family Medicine

## 2023-09-10 ENCOUNTER — Ambulatory Visit (INDEPENDENT_AMBULATORY_CARE_PROVIDER_SITE_OTHER): Admitting: Family Medicine

## 2023-09-10 ENCOUNTER — Encounter: Payer: Self-pay | Admitting: Family Medicine

## 2023-09-10 VITALS — BP 124/70 | HR 80 | Ht 61.0 in | Wt 120.4 lb

## 2023-09-10 DIAGNOSIS — Z87440 Personal history of urinary (tract) infections: Secondary | ICD-10-CM | POA: Diagnosis not present

## 2023-09-10 DIAGNOSIS — R232 Flushing: Secondary | ICD-10-CM

## 2023-09-10 DIAGNOSIS — R399 Unspecified symptoms and signs involving the genitourinary system: Secondary | ICD-10-CM

## 2023-09-10 MED ORDER — ESTRADIOL 0.1 MG/GM VA CREA: 1.0000 | TOPICAL_CREAM | VAGINAL | 12 refills | Status: DC

## 2023-09-10 NOTE — Progress Notes (Signed)
 Acute visit   Patient: Brandy Wong   DOB: 11/05/1934   89 y.o. Female  MRN: [id number] PCP: Myrla Jon HERO, MD   Chief Complaint  Patient presents with   Hot Flashes    Patient is present due to hot flashes associated with dryness and sleepless nights X several years. Reports she would like to see about receiving a medication that will help her symptoms as she is currently not taking anything.    Subjective    Discussed the use of AI scribe software for clinical note transcription with the patient, who gave verbal consent to proceed.  History of Present Illness   Brandy Wong is an 89 year old female who presents with hot flashes and sleep disturbances.  She experiences hot flashes and sleep disturbances, often getting only about three hours of sleep per night. She has frequent nighttime urination, sometimes up to three times per night, and suspects an overactive bladder. She is taking daily medication for UTIs and wants to discontinue it if the vaginal cream is effective. She recalls a previous medication with many side effects similar to her current symptoms, which she decided not to take. There is no stress or 'white coat syndrome' affecting her blood pressure.        Review of Systems  Objective    BP 124/70 (BP Location: Left Arm, Patient Position: Sitting, Cuff Size: Normal)   Pulse 80   Ht 5' 1 (1.549 m)   Wt 120 lb 6.4 oz (54.6 kg)   SpO2 100%   BMI 22.75 kg/m  Physical Exam Vitals reviewed.  Constitutional:      General: She is not in acute distress.    Appearance: She is well-developed.  HENT:     Head: Normocephalic and atraumatic.  Eyes:     General: No scleral icterus.    Conjunctiva/sclera: Conjunctivae normal.  Cardiovascular:     Rate and Rhythm: Normal rate and regular rhythm.  Pulmonary:     Effort: Pulmonary effort is normal. No respiratory distress.  Skin:    General: Skin is warm and dry.     Findings: No rash.  Neurological:      Mental Status: She is alert and oriented to person, place, and time.  Psychiatric:        Behavior: Behavior normal.       No results found for any visits on 09/10/23.  Assessment & Plan     Problem List Items Addressed This Visit       Cardiovascular and Mediastinum   Hot flashes - Primary     Other   History of recurrent UTIs   Other Visit Diagnoses       Genitourinary symptoms               Genitourinary symptoms of menopause She experiences vaginal dryness, hot flashes, and sleep disturbances. Interested in vaginal estrogen cream, which may help with vaginal dryness and UTI prevention but not systemic symptoms like hot flashes and fatigue. Open to hormone replacement therapy if needed. - Prescribe Estrace (vaginal estrogen cream) 0.01% to be used nightly for two weeks, then three times a week. - Discuss potential transition to hormone replacement therapy if symptoms persist. - Educate on proper application of the cream using the applicator.  Recurrent urinary tract infections She is currently on daily medication for prevention. Vaginal estrogen cream may reduce UTI frequency by improving vaginal health. - Continue current UTI prevention medication until the  effectiveness of the vaginal estrogen cream is assessed. - Reassess UTI frequency after starting vaginal estrogen cream.  Nocturia and possible overactive bladder Reports nocturia, waking up three times a night, and suspects an overactive bladder. Vaginal estrogen cream may help, but further evaluation may be needed if symptoms persist. - Monitor nocturia symptoms after starting vaginal estrogen cream. - Consider further evaluation for overactive bladder if symptoms do not improve.  Elevated blood pressure, intermittent Exhibits intermittent elevated blood pressure readings, likely influenced by stress or activity. Blood pressure normalizes upon re-evaluation during the visit. - Recheck blood pressure at the end  of the visit to confirm normalization. - Monitor blood pressure readings at future visits.       Meds ordered this encounter  Medications   estradiol (ESTRACE VAGINAL) 0.1 MG/GM vaginal cream    Sig: Place 1 Applicatorful vaginally 3 (three) times a week.    Dispense:  42.5 g    Refill:  12     Return in about 3 months (around 12/11/2023) for CPE.      Jon Eva, MD  [facility] [phone] (phone) [phone] (fax)  [facility]

## 2023-09-15 ENCOUNTER — Ambulatory Visit: Attending: Neurology | Admitting: Physical Therapy

## 2023-09-15 ENCOUNTER — Encounter: Payer: Self-pay | Admitting: Physical Therapy

## 2023-09-15 ENCOUNTER — Other Ambulatory Visit: Payer: Self-pay

## 2023-09-15 DIAGNOSIS — R269 Unspecified abnormalities of gait and mobility: Secondary | ICD-10-CM | POA: Insufficient documentation

## 2023-09-15 DIAGNOSIS — R2681 Unsteadiness on feet: Secondary | ICD-10-CM | POA: Insufficient documentation

## 2023-09-15 DIAGNOSIS — R531 Weakness: Secondary | ICD-10-CM | POA: Diagnosis not present

## 2023-09-15 NOTE — Therapy (Signed)
 OUTPATIENT PHYSICAL THERAPY NEURO EVALUATION   Patient Name: Brandy Wong MRN: [id number] DOB:[birth year], 89 y.o., female Today's Date: 09/15/2023   PCP: Myrla Jon HERO, MD REFERRING PROVIDER: Maree Jannett POUR, MD   END OF SESSION:   PT End of Session - 09/15/23 1146     Visit Number 1    Number of Visits 24    Date for PT Re-Evaluation 12/08/23    PT Start Time 1145    PT Stop Time 1235    PT Time Calculation (min) 50 min    Equipment Utilized During Treatment Gait belt    Activity Tolerance Patient tolerated treatment well    Behavior During Therapy WFL for tasks assessed/performed          Past Medical History:  Diagnosis Date   Arthritis    Cancer (HCC) 2013   R breast, s/p radiation and lumpectomy   Cataract    R eye   Chronic cystitis    Female bladder prolapse    Frequent urination    GERD (gastroesophageal reflux disease)    Hypertension    Insomnia    Melanoma (HCC)    left upper limb   Squamous cell carcinoma in situ    Past Surgical History:  Procedure Laterality Date   APPENDECTOMY     BREAST EXCISIONAL BIOPSY Left    benign   BREAST EXCISIONAL BIOPSY Left    benign   BREAST SURGERY Right 2013   lumpectomy post positive breast ca, radiation   CATARACT EXTRACTION     LAPAROSCOPIC VAGINAL HYSTERECTOMY WITH SALPINGO OOPHORECTOMY     TUBAL LIGATION     Patient Active Problem List   Diagnosis Date Noted   Hot flashes 08/04/2023   Hyperlipidemia 06/23/2023   History of recurrent UTIs 06/23/2023   Chronic cystitis 06/23/2023   Melanoma of skin (HCC) 06/23/2023   Osteoporosis 06/23/2022   Syncope 06/23/2022   Senile purpura (HCC) 12/19/2021   Vasomotor symptoms due to menopause 07/29/2021   Chronic cough 11/15/2020   Non-healing wound of lower extremity 11/15/2020   Synovial cyst of hand 11/15/2020   Insomnia 11/08/2018   Balance problem 11/08/2018   GERD (gastroesophageal reflux disease) 10/31/2016   Essential hypertension 10/31/2016    Avitaminosis D 10/31/2016   History of uterine prolapse 05/19/2013   History of breast cancer 11/05/2012   SUI (stress urinary incontinence, female) 03/18/2012   Pelvic relaxation due to enterocele, vaginal 12/16/2011    ONSET DATE: ~1-2 years ago  REFERRING DIAG: G62.9 (ICD-10-CM) - Polyneuropathy, unspecified   THERAPY DIAG:  Unsteadiness on feet  Generalized weakness  Abnormality of gait  Rationale for Evaluation and Treatment: Rehabilitation  SUBJECTIVE:  SUBJECTIVE STATEMENT:  Pt states she had previously been going to a personal trainer prior to seeing Dr. Maree in May 2025. Pt states she believes the personal trainer was too much for her because she had to rest for the remainder of the day after each session; therefore, she has discontinued going to the personal trainer.   Pt states MD believes her symptoms are a result of post-polio. Pt states when she had polio at 14 or 89y.o., she went to rehab for 4 months, and came home and was completely independent without aids/braces. Pt states she golfed for ~50years to stay active, reports she stopped playing ~3 years ago due to concern of falling on uneven/compliant surface of grass. Pt states now she walks her dog 2x/day for exercise. Pt states when she starts the walk, she is doing good, but then she quickly notices fatigue in her B LE muscles and worsening of her balance. Pt states she has recently been diagnosed with polyneuropathy.   Pt states balance and B LE lower leg weakness (below her knees) are her greatest deficits.   Reports she also does yoga and tried to perform tree pose, standing on 1 foot, which made her realize her balance deficits.   Pt states she performs the below exercises at least every other day:  Single leg stance Yoga  stretches in standing and sitting  Sun Pose Childs pose Forward and lateral foot taps Stopped doing deep knee bends due to pain, and MD recommended she discontinue this exercise Supine bicycle ab exercises Standing heel raises Seated piriformis/trunk rotation stretch Quadruped bird dogs Prone Superman  Pt states she gets on/off the floor to do her exercises and is able to do this successfully without support from a chair/counter.  Pt reports she has noticed she is weak when going to step-up onto the 1 step to enter/exit her back porch.  Pt reports increased postural sway during gait and will tend to lean towards the side her purse is on her shoulder.   Pt states she also doesn't sleep well, which impacts what she feels up to doing each day.   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 Severe sensory motor polyneuropathy confirmed by nerve conduction study. Sensory neuropathy contributes to balance issues due to impaired pressure sensation in feet, affecting her ability to make micro adjustments for balance. Lack of vibration sensation in feet noted, affecting balance.  Chronic denervation in limbs confirmed by EMG (05/2023). Polio diagnosed in the 1950s, likely contributing to current weakness and fatigue. Symptoms include leg weakness, fatigue, and balance issues. Post-polio syndrome contributes to muscle weakness, affecting her ability to prevent falls. Emphasized the need for progressive muscle loading and avoiding overexertion.   PAIN:  Are you having pain? No, but pt does states she will sometimes have back pain if she stands for too long (especially when working in her kitchen)  PRECAUTIONS: Fall  RED  FLAGS: None   WEIGHT BEARING RESTRICTIONS: No  FALLS: Has patient fallen in last 6 months? No, but pt states sometimes she stumbles when getting out of the bed too quickly to get to the bathroom  LIVING ENVIRONMENT: Lives with: lives alone Lives in: Other 1 story townhouse Stairs: enters through garage mostly, which has ramped entrance; but does have 1 step to enter/exit  front and back doors of home Has following equipment at home: Single point cane, Grab bars, and built in shower seat  PLOF: Independent, but has recently hired support to help with household tasks  PATIENT GOALS: Improve balance and strength legs  OBJECTIVE:  Note: Objective measures were completed at Evaluation unless otherwise noted.  DIAGNOSTIC FINDINGS:  Per MD note on 07/14/2023 NCS/EMG lower extremity (05/12/23) Impression: This is an abnormal electrodiagnostic study consistent with 1) generalized severe sensorimotor polyneuropathy. 2) EMG changes in both lower extremities shows signs of chronic denervation.  COGNITION: Overall cognitive status: Within functional limits for tasks assessed   SENSATION: Able to sense touch on screen, but pt reports LEs feel numb in some places and had NCS/EMG nerve conduction showing decreased sensation MD note stating absent vibratory sense  COORDINATION: WFL, abnormal movement patterns due to weakness, but not cerebellar incoordination  EDEMA:  Not formally assessed, none observed  MUSCLE TONE: Not formally assessed  MUSCLE LENGTH: Not formally assessed  DTRs:  Not formally assessed  POSTURE: rounded shoulders, forward head, and posterior pelvic tilt  LOWER EXTREMITY ROM:     Active   WFL in B LEs for functional mobility tasks  Right Eval Left Eval  Hip flexion    Hip extension    Hip abduction    Hip adduction    Hip internal rotation    Hip external rotation    Knee flexion    Knee extension    Ankle dorsiflexion    Ankle plantarflexion     Ankle inversion    Ankle eversion     (Blank rows = not tested)  LOWER EXTREMITY MMT:    MMT Right Eval Left Eval  Hip flexion 3+ 3+  Hip extension    Hip abduction    Hip adduction    Hip internal rotation    Hip external rotation    Knee flexion 4- 4  Knee extension 4 4  Ankle dorsiflexion 3+ 3+  Ankle plantarflexion 4- 4-  Ankle inversion    Ankle eversion    (Blank rows = not tested)  Manual Muscle Test Scale 0/5 = No muscle contraction can be seen or felt 1/5 = Contraction can be felt, but there is no motion 2-/5 = Part moves through incomplete ROM w/ gravity decreased 2/5 = Part moves through complete ROM w/ gravity decreased 2+/5 = Part moves through incomplete ROM (<50%) against gravity or through complete ROM w/ gravity 3-/5 = Part moves through incomplete ROM (>50%) against gravity 3/5 = Part moves through complete ROM against gravity 3+/5 = Part moves through complete ROM against gravity/slight resistance 4-/5= Holds test position against slight to moderate pressure 4/5 = Part moves through complete ROM against gravity/moderate resistance 4+/5= Holds test position against moderate to strong pressure 5/5 = Part moves through complete ROM against gravity/full resistance  BED MOBILITY:  Not tested  TRANSFERS: Sit to stand: Complete Independence and Modified independence  Assistive device utilized: None     Stand to sit: Complete Independence and Modified independence  Assistive device utilized: None     Chair to chair: Complete Independence and Modified independence  Assistive device utilized: None       RAMP:  Not tested  CURB:  Not tested  STAIRS: Findings: Level of Assistance: CGA, Stair Negotiation Technique: Alternating Pattern  with Bilateral Rails, Number of Stairs: 4, Height of Stairs: 6in   , and Comments: decreased ability to power up during ascent and decreased eccentric control on  descent with pt relying on B UE support on the  railings GAIT: Findings: Gait Characteristics: step through pattern, decreased step length- Right, decreased step length- Left, decreased hip/knee flexion- Right, decreased hip/knee flexion- Left, decreased trunk rotation, and wide BOS, Distance walked: ~151ft, Assistive device utilized:None, Level of assistance: SBA, and Comments: increased arm swing bilaterally, lack of trunk rotation, lack of full hip/knee flexion during swing with partial stiff legged gait pattern., slightly wider BOS    FUNCTIONAL TESTS:  5 times sit to stand: 9.77 seconds from green chair, no UE support 6 minute walk test: need to assess 10 meter walk test:  0.86 m/s, no AD Berg Balance Scale: need to assess  Functional gait assessment: need to assess    PATIENT SURVEYS:  ABC scale: The Activities-Specific Balance Confidence (ABC) Scale 0% 10 20 30  40 50 60 70 80 90 100% No confidence<->completely confident  "How confident are you that you will not lose your balance or become unsteady when you . . .   Date tested 09/15/2023  Walk around the house 80%  2. Walk up or down stairs 30%  3. Bend over and pick up a slipper from in front of a closet floor 90%  4. Reach for a small can off a shelf at eye level 90%  5. Stand on tip toes and reach for something above your head 80%  6. Stand on a chair and reach for something 0%  7. Sweep the floor 90%  8. Walk outside the house to a car parked in the driveway 50%  9. Get into or out of a car 90%  10. Walk across a parking lot to the mall 90%  11. Walk up or down a ramp 100%  12. Walk in a crowded mall where people rapidly walk past you 60%  13. Are bumped into by people as you walk through the mall 50%  14. Step onto or off of an escalator while you are holding onto the railing 50%  15. Step onto or off an escalator while holding onto parcels such that you cannot hold onto the railing 100%  16. Walk outside on icy sidewalks 100%  Total: #/16 71.875%                                                                                                                                  TREATMENT DATE: 09/15/2023  10 Meter Walk Test: Patient instructed to walk 10 meters (32.8 ft) as quickly and as safely as possible at their normal speed x2 and at a fast speed x2. Time measured from 2 meter mark to 8 meter mark to accommodate ramp-up and ramp-down.  Normal speed 1: 0.83 m/s (12.00 sec) Normal speed 2: 0.89 m/s (11.16sec) Average Normal speed: 0.86 m/s Fast speed 1: 1.14 m/s (8.76 sec) - pt states I never walk this fast  Fast speed 2: 1.04 m/s (9.56 sec) Average Fast speed: 1.09  m/s Cut off scores: <0.4 m/s = household Ambulator, 0.4-0.8 m/s = limited community Ambulator, >0.8 m/s = community Ambulator, >1.2 m/s = crossing a street, <1.0 = increased fall risk MCID 0.05 m/s (small), 0.13 m/s (moderate), 0.06 m/s (significant)  (ANPTA Core Set of Outcome Measures for Adults with Neurologic Conditions, 2018)    Five times Sit to Stand Test (FTSS) "Stand up and sit down as quickly as possible 5 times, keeping your arms folded across your chest."    TIME: 9.77 seconds from green chair, no UE support  Times > 13.6 seconds is associated with increased disability and morbidity (Guralnik, 2000) Times > 15 seconds is predictive of recurrent falls in healthy individuals aged 57 and older (Buatois, et al., 2008) Normal performance values in community dwelling individuals aged 14 and older (Bohannon, 2006): 60-69 years: 11.4 seconds 70-79 years: 12.6 seconds 80-89 years: 14.8 seconds  MCID: >= 2.3 seconds for Vestibular Disorders (Meretta, 2006)   PATIENT EDUCATION: Education details: PT POC, initial HEP, findings of assessment Person educated: Patient Education method: Explanation and Handouts Education comprehension: verbalized understanding and needs further education  HOME EXERCISE PROGRAM:  Access Code: [email or web address] URL: [email or web address] Date:  09/15/2023 Prepared by: Connell Kiss  Exercises - Seated Ankle Dorsiflexion with Anchored Resistance  - 1 x daily - 7 x weekly - 2 sets - 10 reps - Standing March with Counter Support  - 1 x daily - 7 x weekly - 2 sets - 10 reps    GOALS: Goals reviewed with patient? Yes  SHORT TERM GOALS: Target date: 10/27/2023  Patient will be independent with home exercise program to improve strength/mobility for better functional independence with ADLs and community level activities.  Baseline: initiated on 09/15/2023 Goal status: INITIAL  LONG TERM GOALS: Target date: 12/08/2023   Patient will increase Berg Balance score to > 51/56 to demonstrate improved balance and decreased fall risk during functional activities and ADLs.  Baseline: need to assess Goal status: INITIAL  2.  Patient will increase six minute walk test distance to >1036ft for progression to community level ambulation, demonstrating improved gait endurance.  Baseline: need to assess Goal status: INITIAL  3.  Patient will increase 10 meter walk test to >1.32m/s as to improve gait speed for better community ambulation and to reduce fall risk. Baseline: 0.86 m/s Goal status: INITIAL  4.  Patient will improve ABC scale score >80% to demonstrate increased confidence with functional mobility and ADLs. Baseline: 71.875% Goal status: INITIAL  5.  Patient will increase Functional Gait Assessment (FGA) score to >20/30 as to reduce fall risk and improve dynamic gait safety with community ambulation.  Baseline: need to assess Goal status: INITIAL   ASSESSMENT:  CLINICAL IMPRESSION: Patient is an 89 y.o. female who was seen today for physical therapy evaluation and treatment for imbalance and B LE weakness likely resulting from post-polio syndrome. Patient demonstrates significant B LE weakness as noted on MMT assessment specifically in hip flexors and ankle DFs; however, demonstrates adequate functional strength in quads and glutes on  5xSTS. Patient demonstrates increased fall risk as noted by decreased gait speed on and reports of decreased confidence with functional mobility on ABC scale. Patient reports she notices she is quick to fatigue with impaired muscle endurance also resulting in worsening of her imbalance. Ms. Jian will benefit from further skilled PT to improve these deficits in order to increase QOL, decrease fall risk, and ease/safety with ADLs and community activities.   OBJECTIVE IMPAIRMENTS: Abnormal gait,  decreased activity tolerance, decreased balance, decreased endurance, decreased mobility, difficulty walking, decreased strength, impaired sensation, and pain.   ACTIVITY LIMITATIONS: carrying, lifting, bending, standing, squatting, stairs, transfers, bathing, dressing, reach over head, and locomotion level  PARTICIPATION LIMITATIONS: meal prep, cleaning, laundry, shopping, community activity, and yard work  PERSONAL FACTORS: Age, Fitness, Time since onset of injury/illness/exacerbation, and 3+ comorbidities: Post-Polio Syndrome, Osteoporosis, Arthritis, GERD, HTN, Insomnia, and Melanoma are also affecting patient's functional outcome.   REHAB POTENTIAL: Good  CLINICAL DECISION MAKING: Evolving/moderate complexity  EVALUATION COMPLEXITY: Moderate  PLAN:  PT FREQUENCY: 1-2x/week   PT DURATION: 12 weeks  PLANNED INTERVENTIONS: [id number]- PT Re-evaluation, 97750- Physical Performance Testing, 97110-Therapeutic exercises, 97530- Therapeutic activity, [id number]- Neuromuscular re-education, 97535- Self Care, [id number]- Manual therapy, [id number]- Gait training, [phone]- Orthotic Initial, [phone]- Orthotic/Prosthetic subsequent, [phone]- Canalith repositioning, [phone]- Electrical stimulation (manual), Patient/Family education, Balance training, Stair training, Joint mobilization, Vestibular training, DME instructions, Cryotherapy, Moist heat, and Biofeedback  PLAN FOR NEXT SESSION:  - walk test - Berg - FGA -  follow-up on initial HEP       Wash Nienhaus, PT, DPT, NCS, CSRS Physical Therapist - [place]  [facility]  1:37 PM 09/15/23

## 2023-09-16 NOTE — Therapy (Incomplete)
 OUTPATIENT PHYSICAL THERAPY NEURO TREATMENT   Patient Name: Brandy Wong MRN: [id number] DOB:1934-03-30, 89 y.o., female Today's Date: 09/16/2023   PCP: Myrla Jon HERO, MD REFERRING PROVIDER: Maree Jannett POUR, MD   END OF SESSION:     Past Medical History:  Diagnosis Date   Arthritis    Cancer [facility]) 2013   R breast, s/p radiation and lumpectomy   Cataract    R eye   Chronic cystitis    Female bladder prolapse    Frequent urination    GERD (gastroesophageal reflux disease)    Hypertension    Insomnia    Melanoma (HCC)    left upper limb   Squamous cell carcinoma in situ    Past Surgical History:  Procedure Laterality Date   APPENDECTOMY     BREAST EXCISIONAL BIOPSY Left    benign   BREAST EXCISIONAL BIOPSY Left    benign   BREAST SURGERY Right 2013   lumpectomy post positive breast ca, radiation   CATARACT EXTRACTION     LAPAROSCOPIC VAGINAL HYSTERECTOMY WITH SALPINGO OOPHORECTOMY     TUBAL LIGATION     Patient Active Problem List   Diagnosis Date Noted   Hot flashes 08/04/2023   Hyperlipidemia 06/23/2023   History of recurrent UTIs 06/23/2023   Chronic cystitis 06/23/2023   Melanoma of skin (HCC) 06/23/2023   Osteoporosis 06/23/2022   Syncope 06/23/2022   Senile purpura (HCC) 12/19/2021   Vasomotor symptoms due to menopause 07/29/2021   Chronic cough 11/15/2020   Non-healing wound of lower extremity 11/15/2020   Synovial cyst of hand 11/15/2020   Insomnia 11/08/2018   Balance problem 11/08/2018   GERD (gastroesophageal reflux disease) 10/31/2016   Essential hypertension 10/31/2016   Avitaminosis D 10/31/2016   History of uterine prolapse 05/19/2013   History of breast cancer 11/05/2012   SUI (stress urinary incontinence, female) 03/18/2012   Pelvic relaxation due to enterocele, vaginal 12/16/2011    ONSET DATE: ~1-2 years ago  REFERRING DIAG: G62.9 (ICD-10-CM) - Polyneuropathy, unspecified   THERAPY DIAG:  No diagnosis found.  Rationale  for Evaluation and Treatment: Rehabilitation  SUBJECTIVE:                                                                                                                                                                                             SUBJECTIVE STATEMENT:  ***   Per eval:  Pt states she had previously been going to a personal trainer prior to seeing Dr. Maree in May 2025. Pt states she believes the personal trainer was too much for her because she had to rest for the  remainder of the day after each session; therefore, she has discontinued going to the personal trainer.   Pt states MD believes her symptoms are a result of post-polio. Pt states when she had polio at 14 or 89y.o., she went to rehab for 4 months, and came home and was completely independent without aids/braces. Pt states she golfed for ~50years to stay active, reports she stopped playing ~3 years ago due to concern of falling on uneven/compliant surface of grass. Pt states now she walks her dog 2x/day for exercise. Pt states when she starts the walk, she is doing good, but then she quickly notices fatigue in her B LE muscles and worsening of her balance. Pt states she has recently been diagnosed with polyneuropathy.   Pt states balance and B LE lower leg weakness (below her knees) are her greatest deficits.   Reports she also does yoga and tried to perform tree pose, standing on 1 foot, which made her realize her balance deficits.   Pt states she performs the below exercises at least every other day:  Single leg stance Yoga stretches in standing and sitting  Sun Pose Childs pose Forward and lateral foot taps Stopped doing deep knee bends due to pain, and MD recommended she discontinue this exercise Supine bicycle ab exercises Standing heel raises Seated piriformis/trunk rotation stretch Quadruped bird dogs Prone Superman  Pt states she gets on/off the floor to do her exercises and is able to do this  successfully without support from a chair/counter.  Pt reports she has noticed she is weak when going to step-up onto the 1 step to enter/exit her back porch.  Pt reports increased postural sway during gait and will tend to lean towards the side her purse is on her shoulder.   Pt states she also doesn't sleep well, which impacts what she feels up to doing each day.   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 Severe sensory motor polyneuropathy confirmed by nerve conduction study. Sensory neuropathy contributes to balance issues due to impaired pressure sensation in feet, affecting her ability to make micro adjustments for balance. Lack of vibration sensation in feet noted, affecting balance.  Chronic denervation in limbs confirmed by EMG (05/2023). Polio diagnosed in the 1950s, likely contributing to current weakness and fatigue. Symptoms include leg weakness, fatigue, and balance issues. Post-polio syndrome contributes to muscle weakness, affecting her ability to prevent falls. Emphasized the need for progressive muscle loading and avoiding overexertion.   PAIN:  Are you having pain? No, but pt does states she will sometimes have back pain if she stands for too long (especially when working in her kitchen)  PRECAUTIONS: Fall  RED FLAGS: None   WEIGHT BEARING RESTRICTIONS: No  FALLS: Has patient fallen in last 6 months? No, but pt states sometimes she stumbles when getting out of the bed too quickly to get to the bathroom  LIVING ENVIRONMENT: Lives with: lives alone Lives in: Other 1 story townhouse Stairs: enters through garage mostly, which has ramped entrance; but does have 1 step to enter/exit front and back doors of  home Has following equipment at home: Single point cane, Grab bars, and built in shower seat  PLOF: Independent, but has recently hired support to help with household tasks  PATIENT GOALS: Improve balance and strength  legs  OBJECTIVE:  Note: Objective measures were completed at Evaluation unless otherwise noted.  DIAGNOSTIC FINDINGS:  Per MD note on 07/14/2023 NCS/EMG lower extremity (05/12/23) Impression: This is an abnormal electrodiagnostic study consistent with 1) generalized severe sensorimotor polyneuropathy. 2) EMG changes in both lower extremities shows signs of chronic denervation.  COGNITION: Overall cognitive status: Within functional limits for tasks assessed   SENSATION: Able to sense touch on screen, but pt reports LEs feel numb in some places and had NCS/EMG nerve conduction showing decreased sensation MD note stating absent vibratory sense  COORDINATION: WFL, abnormal movement patterns due to weakness, but not cerebellar incoordination  EDEMA:  Not formally assessed, none observed  MUSCLE TONE: Not formally assessed  MUSCLE LENGTH: Not formally assessed  DTRs:  Not formally assessed  POSTURE: rounded shoulders, forward head, and posterior pelvic tilt  LOWER EXTREMITY ROM:     Active   WFL in B LEs for functional mobility tasks  Right Eval Left Eval  Hip flexion    Hip extension    Hip abduction    Hip adduction    Hip internal rotation    Hip external rotation    Knee flexion    Knee extension    Ankle dorsiflexion    Ankle plantarflexion    Ankle inversion    Ankle eversion     (Blank rows = not tested)  LOWER EXTREMITY MMT:    MMT Right Eval Left Eval  Hip flexion 3+ 3+  Hip extension    Hip abduction    Hip adduction    Hip internal rotation    Hip external rotation    Knee flexion 4- 4  Knee extension 4 4  Ankle dorsiflexion 3+ 3+  Ankle plantarflexion 4- 4-  Ankle inversion    Ankle eversion    (Blank rows = not  tested)  Manual Muscle Test Scale 0/5 = No muscle contraction can be seen or felt 1/5 = Contraction can be felt, but there is no motion 2-/5 = Part moves through incomplete ROM w/ gravity decreased 2/5 = Part moves through complete ROM w/ gravity decreased 2+/5 = Part moves through incomplete ROM (<50%) against gravity or through complete ROM w/ gravity 3-/5 = Part moves through incomplete ROM (>50%) against gravity 3/5 = Part moves through complete ROM against gravity 3+/5 = Part moves through complete ROM against gravity/slight resistance 4-/5= Holds test position against slight to moderate pressure 4/5 = Part moves through complete ROM against gravity/moderate resistance 4+/5= Holds test position against moderate to strong pressure 5/5 = Part moves through complete ROM against gravity/full resistance  BED MOBILITY:  Not tested  TRANSFERS: Sit to stand: Complete Independence and Modified independence  Assistive device utilized: None     Stand to sit: Complete Independence and Modified independence  Assistive device utilized: None     Chair to chair: Complete Independence and Modified independence  Assistive device utilized: None       RAMP:  Not tested  CURB:  Not tested  STAIRS: Findings: Level of Assistance: CGA, Stair Negotiation Technique: Alternating Pattern  with Bilateral Rails, Number of Stairs: 4, Height of Stairs: 6in   , and Comments: decreased ability to power up during ascent and decreased eccentric control on descent with pt relying on B UE support on the railings GAIT: Findings: Gait Characteristics: step through pattern, decreased step length- Right, decreased step length- Left, decreased hip/knee flexion- Right, decreased hip/knee flexion- Left, decreased trunk rotation, and wide BOS, Distance  walked: ~124ft, Assistive device utilized:None, Level of assistance: SBA, and Comments: increased arm swing bilaterally, lack of trunk rotation, lack of full hip/knee flexion  during swing with partial stiff legged gait pattern., slightly wider BOS    FUNCTIONAL TESTS:  5 times sit to stand: 9.77 seconds from green chair, no UE support 6 minute walk test: need to assess 10 meter walk test:  0.86 m/s, no AD Berg Balance Scale: need to assess  Functional gait assessment: need to assess    PATIENT SURVEYS:  ABC scale: The Activities-Specific Balance Confidence (ABC) Scale 0% 10 20 30  40 50 60 70 80 90 100% No confidence<->completely confident  "How confident are you that you will not lose your balance or become unsteady when you . . .   Date tested 09/15/2023  Walk around the house 80%  2. Walk up or down stairs 30%  3. Bend over and pick up a slipper from in front of a closet floor 90%  4. Reach for a small can off a shelf at eye level 90%  5. Stand on tip toes and reach for something above your head 80%  6. Stand on a chair and reach for something 0%  7. Sweep the floor 90%  8. Walk outside the house to a car parked in the driveway 50%  9. Get into or out of a car 90%  10. Walk across a parking lot to the mall 90%  11. Walk up or down a ramp 100%  12. Walk in a crowded mall where people rapidly walk past you 60%  13. Are bumped into by people as you walk through the mall 50%  14. Step onto or off of an escalator while you are holding onto the railing 50%  15. Step onto or off an escalator while holding onto parcels such that you cannot hold onto the railing 100%  16. Walk outside on icy sidewalks 100%  Total: #/16 71.875%                                                                                                                                 TREATMENT DATE: 09/16/2023  Performance Measures: 6 Min Walk Test:  Instructed patient to ambulate as quickly and as safely as possible for 6 minutes using LRAD. Patient was allowed to take standing rest breaks without stopping the test, but if the patient required a sitting rest break the clock would be  stopped and the test would be over.  Results: *** feet (*** meters, Avg speed ***m/s) using a *** with ***. Results indicate that the patient has reduced endurance with ambulation compared to age matched norms.  Age Matched Norms: 35-69 yo M: 77 F: 38, 41-79 yo M: 66 F: 471, 7-89 yo M: 417 F: 392 MDC: 58.21 meters (190.98 feet) or 50 meters (ANPTA Core Set of Outcome Measures for Adults with Neurologic Conditions, 2018)  FGA: BERG:  PATIENT EDUCATION:  Education details: PT POC, initial HEP, findings of assessment Person educated: Patient Education method: Explanation and Handouts Education comprehension: verbalized understanding and needs further education  HOME EXERCISE PROGRAM:  Access Code: [email or web address] URL: [email or web address] Date: 09/15/2023 Prepared by: Connell Kiss  Exercises - Seated Ankle Dorsiflexion with Anchored Resistance  - 1 x daily - 7 x weekly - 2 sets - 10 reps - Standing March with Counter Support  - 1 x daily - 7 x weekly - 2 sets - 10 reps    GOALS: Goals reviewed with patient? Yes  SHORT TERM GOALS: Target date: 10/27/2023  Patient will be independent with home exercise program to improve strength/mobility for better functional independence with ADLs and community level activities.  Baseline: initiated on 09/15/2023 Goal status: INITIAL  LONG TERM GOALS: Target date: 12/08/2023   Patient will increase Berg Balance score to > 51/56 to demonstrate improved balance and decreased fall risk during functional activities and ADLs.  Baseline: need to assess Goal status: INITIAL  2.  Patient will increase six minute walk test distance to >1081ft for progression to community level ambulation, demonstrating improved gait endurance.  Baseline: need to assess Goal status: INITIAL  3.  Patient will increase 10 meter walk test to >1.65m/s as to improve gait speed for better community ambulation and to reduce fall risk. Baseline: 0.86 m/s Goal  status: INITIAL  4.  Patient will improve ABC scale score >80% to demonstrate increased confidence with functional mobility and ADLs. Baseline: 71.875% Goal status: INITIAL  5.  Patient will increase Functional Gait Assessment (FGA) score to >20/30 as to reduce fall risk and improve dynamic gait safety with community ambulation.  Baseline: need to assess Goal status: INITIAL   ASSESSMENT:  CLINICAL IMPRESSION: *** . Ms. Tiedeman will benefit from further skilled PT to improve these deficits in order to increase QOL, decrease fall risk, and ease/safety with ADLs and community activities.   OBJECTIVE IMPAIRMENTS: Abnormal gait, decreased activity tolerance, decreased balance, decreased endurance, decreased mobility, difficulty walking, decreased strength, impaired sensation, and pain.   ACTIVITY LIMITATIONS: carrying, lifting, bending, standing, squatting, stairs, transfers, bathing, dressing, reach over head, and locomotion level  PARTICIPATION LIMITATIONS: meal prep, cleaning, laundry, shopping, community activity, and yard work  PERSONAL FACTORS: Age, Fitness, Time since onset of injury/illness/exacerbation, and 3+ comorbidities: Post-Polio Syndrome, Osteoporosis, Arthritis, GERD, HTN, Insomnia, and Melanoma are also affecting patient's functional outcome.   REHAB POTENTIAL: Good  CLINICAL DECISION MAKING: Evolving/moderate complexity  EVALUATION COMPLEXITY: Moderate  PLAN:  PT FREQUENCY: 1-2x/week   PT DURATION: 12 weeks  PLANNED INTERVENTIONS: [id number]- PT Re-evaluation, 97750- Physical Performance Testing, 97110-Therapeutic exercises, 97530- Therapeutic activity, 97112- Neuromuscular re-education, 97535- Self Care, [id number]- Manual therapy, [phone]- Gait training, [phone]- Orthotic Initial, [phone]- Orthotic/Prosthetic subsequent, [phone]- Canalith repositioning, [phone]- Electrical stimulation (manual), Patient/Family education, Balance training, Stair training, Joint mobilization, Vestibular  training, DME instructions, Cryotherapy, Moist heat, and Biofeedback  PLAN FOR NEXT SESSION:  - walk test - Berg - FGA - follow-up on initial HEP       Muhannad Bignell  Leopoldo, PT, DPT Physical Therapist - [facility]  Outpatient Physical Therapy- Main Campus [phone]    9:51 AM 09/16/23

## 2023-09-17 ENCOUNTER — Ambulatory Visit

## 2023-09-17 DIAGNOSIS — R531 Weakness: Secondary | ICD-10-CM

## 2023-09-17 DIAGNOSIS — R2681 Unsteadiness on feet: Secondary | ICD-10-CM

## 2023-09-17 DIAGNOSIS — R269 Unspecified abnormalities of gait and mobility: Secondary | ICD-10-CM | POA: Diagnosis not present

## 2023-09-17 NOTE — Therapy (Signed)
 OUTPATIENT PHYSICAL THERAPY NEURO TREATMENT   Patient Name: Brandy Wong MRN: [id number] DOB:1934-11-08, 89 y.o., female Today's Date: 09/17/2023   PCP: Myrla Jon HERO, MD REFERRING PROVIDER: Maree Jannett POUR, MD   END OF SESSION:   PT End of Session - 09/17/23 1308     Visit Number 2    Number of Visits 24    Date for PT Re-Evaluation 12/08/23    PT Start Time 1315    PT Stop Time 1357    PT Time Calculation (min) 42 min    Equipment Utilized During Treatment Gait belt    Activity Tolerance Patient tolerated treatment well    Behavior During Therapy WFL for tasks assessed/performed           Past Medical History:  Diagnosis Date   Arthritis    Cancer (HCC) 2013   R breast, s/p radiation and lumpectomy   Cataract    R eye   Chronic cystitis    Female bladder prolapse    Frequent urination    GERD (gastroesophageal reflux disease)    Hypertension    Insomnia    Melanoma (HCC)    left upper limb   Squamous cell carcinoma in situ    Past Surgical History:  Procedure Laterality Date   APPENDECTOMY     BREAST EXCISIONAL BIOPSY Left    benign   BREAST EXCISIONAL BIOPSY Left    benign   BREAST SURGERY Right 2013   lumpectomy post positive breast ca, radiation   CATARACT EXTRACTION     LAPAROSCOPIC VAGINAL HYSTERECTOMY WITH SALPINGO OOPHORECTOMY     TUBAL LIGATION     Patient Active Problem List   Diagnosis Date Noted   Hot flashes 08/04/2023   Hyperlipidemia 06/23/2023   History of recurrent UTIs 06/23/2023   Chronic cystitis 06/23/2023   Melanoma of skin (HCC) 06/23/2023   Osteoporosis 06/23/2022   Syncope 06/23/2022   Senile purpura (HCC) 12/19/2021   Vasomotor symptoms due to menopause 07/29/2021   Chronic cough 11/15/2020   Non-healing wound of lower extremity 11/15/2020   Synovial cyst of hand 11/15/2020   Insomnia 11/08/2018   Balance problem 11/08/2018   GERD (gastroesophageal reflux disease) 10/31/2016   Essential hypertension  10/31/2016   Avitaminosis D 10/31/2016   History of uterine prolapse 05/19/2013   History of breast cancer 11/05/2012   SUI (stress urinary incontinence, female) 03/18/2012   Pelvic relaxation due to enterocele, vaginal 12/16/2011    ONSET DATE: ~1-2 years ago  REFERRING DIAG: G62.9 (ICD-10-CM) - Polyneuropathy, unspecified   THERAPY DIAG:  Unsteadiness on feet  Generalized weakness  Abnormality of gait  Rationale for Evaluation and Treatment: Rehabilitation  SUBJECTIVE:  SUBJECTIVE STATEMENT:  Pt reports that she felt fine after evaluation. Pt reports that her energy levels are low today and she did not sleep well last night.    FROM EVAL: Pt states MD believes her symptoms are a result of post-polio. Pt states when she had polio at 14 or 89y.o., she went to rehab for 4 months, and came home and was completely independent without aids/braces. Pt states she golfed for ~50years to stay active, reports she stopped playing ~3 years ago due to concern of falling on uneven/compliant surface of grass. Pt states now she walks her dog 2x/day for exercise. Pt states when she starts the walk, she is doing good, but then she quickly notices fatigue in her B LE muscles and worsening of her balance. Pt states she has recently been diagnosed with polyneuropathy.   Pt states balance and B LE lower leg weakness (below her knees) are her greatest deficits.   Reports she also does yoga and tried to perform tree pose, standing on 1 foot, which made her realize her balance deficits.   Pt states she performs the below exercises at least every other day:  Single leg stance Yoga stretches in standing and sitting  Sun Pose Childs pose Forward and lateral foot taps Stopped doing deep knee bends due to pain, and MD  recommended she discontinue this exercise Supine bicycle ab exercises Standing heel raises Seated piriformis/trunk rotation stretch Quadruped bird dogs Prone Superman  Pt states she gets on/off the floor to do her exercises and is able to do this successfully without support from a chair/counter.  Pt reports she has noticed she is weak when going to step-up onto the 1 step to enter/exit her back porch.  Pt reports increased postural sway during gait and will tend to lean towards the side her purse is on her shoulder.   Pt states she also doesn't sleep well, which impacts what she feels up to doing each day.   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 Severe sensory motor polyneuropathy confirmed by nerve conduction study. Sensory neuropathy contributes to balance issues due to impaired pressure sensation in feet, affecting her ability to make micro adjustments for balance. Lack of vibration sensation in feet noted, affecting balance.  Chronic denervation in limbs confirmed by EMG (05/2023). Polio diagnosed in the 1950s, likely contributing to current weakness and fatigue. Symptoms include leg weakness, fatigue, and balance issues. Post-polio syndrome contributes to muscle weakness, affecting her ability to prevent falls. Emphasized the need for progressive muscle loading and avoiding overexertion.   PAIN:  Are you having pain? No, but pt does states she will sometimes have back pain if she stands for too long (especially when working in her kitchen)  PRECAUTIONS: Fall  RED FLAGS: None   WEIGHT BEARING RESTRICTIONS: No  FALLS: Has patient fallen in last 6 months? No, but pt states sometimes she stumbles when getting out of  the bed too quickly to get to the bathroom  LIVING ENVIRONMENT: Lives with: lives alone Lives in: Other 1 story townhouse Stairs: enters through garage mostly, which has ramped entrance; but does have 1 step to enter/exit front and back doors of home Has following equipment at home: Single point cane, Grab bars, and built in shower seat  PLOF: Independent, but has  recently hired support to help with household tasks  PATIENT GOALS: Improve balance and strength legs  OBJECTIVE:  Note: Objective measures were completed at Evaluation unless otherwise noted.  DIAGNOSTIC FINDINGS:  Per MD note on 07/14/2023 NCS/EMG lower extremity (05/12/23) Impression: This is an abnormal electrodiagnostic study consistent with 1) generalized severe sensorimotor polyneuropathy. 2) EMG changes in both lower extremities shows signs of chronic denervation.  COGNITION: Overall cognitive status: Within functional limits for tasks assessed   SENSATION: Able to sense touch on screen, but pt reports LEs feel numb in some places and had NCS/EMG nerve conduction showing decreased sensation MD note stating absent vibratory sense  COORDINATION: WFL, abnormal movement patterns due to weakness, but not cerebellar incoordination  EDEMA:  Not formally assessed, none observed  MUSCLE TONE: Not formally assessed  MUSCLE LENGTH: Not formally assessed  DTRs:  Not formally assessed  POSTURE: rounded shoulders, forward head, and posterior pelvic tilt  LOWER EXTREMITY ROM:     Active   WFL in B LEs for functional mobility tasks  Right Eval Left Eval  Hip flexion    Hip extension    Hip abduction    Hip adduction    Hip internal rotation    Hip external rotation    Knee flexion    Knee extension    Ankle dorsiflexion    Ankle plantarflexion    Ankle inversion    Ankle eversion     (Blank rows = not tested)  LOWER EXTREMITY MMT:    MMT Right Eval Left Eval  Hip flexion 3+ 3+  Hip extension     Hip abduction    Hip adduction    Hip internal rotation    Hip external rotation    Knee flexion 4- 4  Knee extension 4 4  Ankle dorsiflexion 3+ 3+  Ankle plantarflexion 4- 4-  Ankle inversion    Ankle eversion    (Blank rows = not tested)  Manual Muscle Test Scale 0/5 = No muscle contraction can be seen or felt 1/5 = Contraction can be felt, but there is no motion 2-/5 = Part moves through incomplete ROM w/ gravity decreased 2/5 = Part moves through complete ROM w/ gravity decreased 2+/5 = Part moves through incomplete ROM (<50%) against gravity or through complete ROM w/ gravity 3-/5 = Part moves through incomplete ROM (>50%) against gravity 3/5 = Part moves through complete ROM against gravity 3+/5 = Part moves through complete ROM against gravity/slight resistance 4-/5= Holds test position against slight to moderate pressure 4/5 = Part moves through complete ROM against gravity/moderate resistance 4+/5= Holds test position against moderate to strong pressure 5/5 = Part moves through complete ROM against gravity/full resistance  BED MOBILITY:  Not tested  TRANSFERS: Sit to stand: Complete Independence and Modified independence  Assistive device utilized: None     Stand to sit: Complete Independence and Modified independence  Assistive device utilized: None     Chair to chair: Complete Independence and Modified independence  Assistive device utilized: None       RAMP:  Not tested  CURB:  Not tested  STAIRS: Findings: Level of Assistance: CGA, Stair Negotiation Technique: Alternating Pattern  with Bilateral Rails, Number of Stairs: 4, Height of Stairs: 6in   , and Comments: decreased ability to power up during ascent and decreased eccentric control on descent with pt relying on B UE support on the railings GAIT: Findings: Gait Characteristics: step through pattern, decreased step length- Right, decreased step length- Left,  decreased hip/knee flexion- Right, decreased  hip/knee flexion- Left, decreased trunk rotation, and wide BOS, Distance walked: ~171ft, Assistive device utilized:None, Level of assistance: SBA, and Comments: increased arm swing bilaterally, lack of trunk rotation, lack of full hip/knee flexion during swing with partial stiff legged gait pattern., slightly wider BOS    FUNCTIONAL TESTS:  5 times sit to stand: 9.77 seconds from green chair, no UE support 6 minute walk test: 915 ft on 7/10 with no AD 10 meter walk test:  0.86 m/s, no AD Berg Balance Scale: 45/56 on 7/10 Functional gait assessment: 16/30 on 7/10   PATIENT SURVEYS:  ABC scale: The Activities-Specific Balance Confidence (ABC) Scale 0% 10 20 30  40 50 60 70 80 90 100% No confidence<->completely confident  "How confident are you that you will not lose your balance or become unsteady when you . . .   Date tested 09/15/2023- corrected on 7/10 per pt request  Walk around the house 80%  2. Walk up or down stairs 30%  3. Bend over and pick up a slipper from in front of a closet floor 90%  4. Reach for a small can off a shelf at eye level 90%  5. Stand on tip toes and reach for something above your head 80%  6. Stand on a chair and reach for something 0%  7. Sweep the floor 90%  8. Walk outside the house to a car parked in the driveway 50%  9. Get into or out of a car 90%  10. Walk across a parking lot to the mall 90%  11. Walk up or down a ramp 100%  12. Walk in a crowded mall where people rapidly walk past you 60%  13. Are bumped into by people as you walk through the mall 50%  14. Step onto or off of an escalator while you are holding onto the railing 20%  15. Step onto or off an escalator while holding onto parcels such that you cannot hold onto the railing 0%  16. Walk outside on icy sidewalks 0%  Total: #/16 57.5%                                                                                                                                 TREATMENT DATE:  09/17/2023  Physical Performance Measures: 6 Min Walk Test:  Instructed patient to ambulate as quickly and as safely as possible for 6 minutes using LRAD. Patient was allowed to take standing rest breaks without stopping the test, but if the patient required a sitting rest break the clock would be stopped and the test would be over.  Results: 915 feet (278.89 meters) using no AD with CGA. Results indicate that the patient has reduced endurance with ambulation compared to age matched norms.  Age Matched Norms: 23-69 yo M: 59 F: 77, 66-79 yo M: 21 F: 471, 24-89 yo M: 417 F: 392 MDC: 58.21 meters (190.98 feet)  or 50 meters (ANPTA Core Set of Outcome Measures for Adults with Neurologic Conditions, 2018)  BERG Balance Scale & Functional Gait Assessment:   [facility] PT Assessment - 09/17/23 0001       Berg Balance Test   Sit to Stand Able to stand without using hands and stabilize independently    Standing Unsupported Able to stand safely 2 minutes    Sitting with Back Unsupported but Feet Supported on Floor or Stool Able to sit safely and securely 2 minutes    Stand to Sit Controls descent by using hands    Transfers Able to transfer safely, definite need of hands    Standing Unsupported with Eyes Closed Able to stand 10 seconds safely    Standing Unsupported with Feet Together Able to place feet together independently and stand for 1 minute with supervision    From Standing, Reach Forward with Outstretched Arm Can reach forward >12 cm safely (5)    From Standing Position, Pick up Object from Floor Able to pick up shoe, needs supervision    From Standing Position, Turn to Look Behind Over each Shoulder Looks behind from both sides and weight shifts well    Turn 360 Degrees Able to turn 360 degrees safely one side only in 4 seconds or less    Standing Unsupported, Alternately Place Feet on Step/Stool Able to stand independently and safely and complete 8 steps in 20 seconds    Standing Unsupported,  One Foot in Front Able to take small step independently and hold 30 seconds    Standing on One Leg Tries to lift leg/unable to hold 3 seconds but remains standing independently    Total Score 45      Functional Gait  Assessment   Gait assessed  Yes    Gait Level Surface Walks 20 ft in less than 7 sec but greater than 5.5 sec, uses assistive device, slower speed, mild gait deviations, or deviates 6-10 in outside of the 12 in walkway width.    Change in Gait Speed Makes only minor adjustments to walking speed, or accomplishes a change in speed with significant gait deviations, deviates 10-15 in outside the 12 in walkway width, or changes speed but loses balance but is able to recover and continue walking.    Gait with Horizontal Head Turns Performs head turns smoothly with slight change in gait velocity (eg, minor disruption to smooth gait path), deviates 6-10 in outside 12 in walkway width, or uses an assistive device.    Gait with Vertical Head Turns Performs task with slight change in gait velocity (eg, minor disruption to smooth gait path), deviates 6 - 10 in outside 12 in walkway width or uses assistive device    Gait and Pivot Turn Pivot turns safely within 3 sec and stops quickly with no loss of balance.    Step Over Obstacle Is able to step over one shoe box (4.5 in total height) but must slow down and adjust steps to clear box safely. May require verbal cueing.    Gait with Narrow Base of Support Ambulates less than 4 steps heel to toe or cannot perform without assistance.    Gait with Eyes Closed Walks 20 ft, slow speed, abnormal gait pattern, evidence for imbalance, deviates 10-15 in outside 12 in walkway width. Requires more than 9 sec to ambulate 20 ft.    Ambulating Backwards Walks 20 ft, uses assistive device, slower speed, mild gait deviations, deviates 6-10 in outside 12 in walkway width.  Steps Alternating feet, must use rail.    Total Score 16          Ther-ex: Seated: -  Marches x15 each LE - LAQ x10 each LE - Adduction squeeze with rainbow ball x15   PATIENT EDUCATION: Education details: PT POC, initial HEP, findings of assessment Person educated: Patient Education method: Explanation and Handouts Education comprehension: verbalized understanding and needs further education  HOME EXERCISE PROGRAM:  Access Code: [email or web address] URL: [email or web address] Date: 09/15/2023 Prepared by: Connell Kiss  Exercises - Seated Ankle Dorsiflexion with Anchored Resistance  - 1 x daily - 7 x weekly - 2 sets - 10 reps - Standing March with Counter Support  - 1 x daily - 7 x weekly - 2 sets - 10 reps    GOALS: Goals reviewed with patient? Yes  SHORT TERM GOALS: Target date: 10/27/2023  Patient will be independent with home exercise program to improve strength/mobility for better functional independence with ADLs and community level activities.  Baseline: initiated on 09/15/2023 Goal status: INITIAL  LONG TERM GOALS: Target date: 12/08/2023   Patient will increase Berg Balance score to > 51/56 to demonstrate improved balance and decreased fall risk during functional activities and ADLs.  Baseline: 45/56 on 7/10 Goal status: INITIAL  2.  Patient will increase six minute walk test distance to >1037ft for progression to community level ambulation, demonstrating improved gait endurance.  Baseline: 915 ft on 7/10 with no AD Goal status: INITIAL  3.  Patient will increase 10 meter walk test to >1.44m/s as to improve gait speed for better community ambulation and to reduce fall risk. Baseline: 0.86 m/s Goal status: INITIAL  4.  Patient will improve ABC scale score >80% to demonstrate increased confidence with functional mobility and ADLs. Baseline: 57.5% corrected on 7/10 Goal status: INITIAL  5.  Patient will increase Functional Gait Assessment (FGA) score to >20/30 as to reduce fall risk and improve dynamic gait safety with community ambulation.   Baseline: 16/30 on 7/10 Goal status: INITIAL   ASSESSMENT:  CLINICAL IMPRESSION: Pt tolerated functional assessments performed today with minimal increase in knee pain throughout session. Pt's performance on is indicative of a slight endurance deficit compared to age-matched norms. Dynamic balance measures performed today revealed pt is at an increased risk of falls and demonstrated particular difficulty with tasks requiring increased reliance on single-leg stance. Future sessions to focus on improving pt's tolerance to single-leg weight bearing and increase overall LE strength and endurance. Pt remained engaged throughout session and is motivated to improve her current condition. Ms. Zaun will benefit from further skilled PT to improve these deficits in order to increase QOL, decrease fall risk, and ease/safety with ADLs and community activities.   OBJECTIVE IMPAIRMENTS: Abnormal gait, decreased activity tolerance, decreased balance, decreased endurance, decreased mobility, difficulty walking, decreased strength, impaired sensation, and pain.   ACTIVITY LIMITATIONS: carrying, lifting, bending, standing, squatting, stairs, transfers, bathing, dressing, reach over head, and locomotion level  PARTICIPATION LIMITATIONS: meal prep, cleaning, laundry, shopping, community activity, and yard work  PERSONAL FACTORS: Age, Fitness, Time since onset of injury/illness/exacerbation, and 3+ comorbidities: Post-Polio Syndrome, Osteoporosis, Arthritis, GERD, HTN, Insomnia, and Melanoma are also affecting patient's functional outcome.   REHAB POTENTIAL: Good  CLINICAL DECISION MAKING: Evolving/moderate complexity  EVALUATION COMPLEXITY: Moderate  PLAN:  PT FREQUENCY: 1-2x/week   PT DURATION: 12 weeks  PLANNED INTERVENTIONS: 97164- PT Re-evaluation, 97750- Physical Performance Testing, 97110-Therapeutic exercises, 97530- Therapeutic activity, [id number]- Neuromuscular re-education, 97535- Self Care,  97140- Manual therapy,  02883- Gait training, 02239- Orthotic Initial, [phone]- Orthotic/Prosthetic subsequent, [phone]- Canalith repositioning, [id number]- Electrical stimulation (manual), Patient/Family education, Balance training, Stair training, Joint mobilization, Vestibular training, DME instructions, Cryotherapy, Moist heat, and Biofeedback  PLAN FOR NEXT SESSION:  LE strengthening within limits of knee pain, dynamic balance training, weight shifting tasks to improve tolerance to single-leg stance     [place] Jr, SPT  This entire session was performed under direct supervision and direction of a licensed Estate agent . I have personally read, edited and approve of the note as written.  Marina  Leopoldo, PT, DPT Physical Therapist - [facility]  Outpatient Physical Therapy- Main Campus [phone]       2:14 PM 09/17/23

## 2023-09-22 ENCOUNTER — Ambulatory Visit: Admitting: Physical Therapy

## 2023-09-22 DIAGNOSIS — R531 Weakness: Secondary | ICD-10-CM

## 2023-09-22 DIAGNOSIS — R269 Unspecified abnormalities of gait and mobility: Secondary | ICD-10-CM | POA: Diagnosis not present

## 2023-09-22 DIAGNOSIS — R2681 Unsteadiness on feet: Secondary | ICD-10-CM

## 2023-09-22 NOTE — Therapy (Signed)
 OUTPATIENT PHYSICAL THERAPY NEURO TREATMENT   Patient Name: Brandy Wong MRN: [id number] DOB:12/08/1934, 89 y.o., female Today's Date: 09/22/2023   PCP: Myrla Jon HERO, MD REFERRING PROVIDER: Maree Jannett POUR, MD   END OF SESSION:   PT End of Session - 09/22/23 1317     Visit Number 3    Number of Visits 24    Date for PT Re-Evaluation 12/08/23    PT Start Time 1317    PT Stop Time 1358    PT Time Calculation (min) 41 min    Equipment Utilized During Treatment Gait belt    Activity Tolerance Patient tolerated treatment well    Behavior During Therapy WFL for tasks assessed/performed            Past Medical History:  Diagnosis Date   Arthritis    Cancer (HCC) 2013   R breast, s/p radiation and lumpectomy   Cataract    R eye   Chronic cystitis    Female bladder prolapse    Frequent urination    GERD (gastroesophageal reflux disease)    Hypertension    Insomnia    Melanoma (HCC)    left upper limb   Squamous cell carcinoma in situ    Past Surgical History:  Procedure Laterality Date   APPENDECTOMY     BREAST EXCISIONAL BIOPSY Left    benign   BREAST EXCISIONAL BIOPSY Left    benign   BREAST SURGERY Right 2013   lumpectomy post positive breast ca, radiation   CATARACT EXTRACTION     LAPAROSCOPIC VAGINAL HYSTERECTOMY WITH SALPINGO OOPHORECTOMY     TUBAL LIGATION     Patient Active Problem List   Diagnosis Date Noted   Hot flashes 08/04/2023   Hyperlipidemia 06/23/2023   History of recurrent UTIs 06/23/2023   Chronic cystitis 06/23/2023   Melanoma of skin (HCC) 06/23/2023   Osteoporosis 06/23/2022   Syncope 06/23/2022   Senile purpura (HCC) 12/19/2021   Vasomotor symptoms due to menopause 07/29/2021   Chronic cough 11/15/2020   Non-healing wound of lower extremity 11/15/2020   Synovial cyst of hand 11/15/2020   Insomnia 11/08/2018   Balance problem 11/08/2018   GERD (gastroesophageal reflux disease) 10/31/2016   Essential hypertension  10/31/2016   Avitaminosis D 10/31/2016   History of uterine prolapse 05/19/2013   History of breast cancer 11/05/2012   SUI (stress urinary incontinence, female) 03/18/2012   Pelvic relaxation due to enterocele, vaginal 12/16/2011    ONSET DATE: ~1-2 years ago  REFERRING DIAG: G62.9 (ICD-10-CM) - Polyneuropathy, unspecified   THERAPY DIAG:  Unsteadiness on feet  Generalized weakness  Abnormality of gait  Rationale for Evaluation and Treatment: Rehabilitation  SUBJECTIVE:  SUBJECTIVE STATEMENT:  Pt reports she is a little wobbly today. Pt states she did a lot of stuff this morning and that may be why. Reports when walking this morning she was wanting to drift but she feels it was likely due to increased humidity and fatigue. Pt reports after last 2 therapy sessions she has had a nuisance type pain in bilateral knees.   Pt states when donning her LB clothing while standing, she has a harder time lifting L LE to get her foot into her pants compared to R LE.  Pt discusses how she is performing seated ankle DF exercise and reports difficulty finding something to mount the theraband to.   FROM EVAL: Pt states MD believes her symptoms are a result of post-polio. Pt states when she had polio at 14 or 89y.o., she went to rehab for 4 months, and came home and was completely independent without aids/braces. Pt states she golfed for ~50years to stay active, reports she stopped playing ~3 years ago due to concern of falling on uneven/compliant surface of grass. Pt states now she walks her dog 2x/day for exercise. Pt states when she starts the walk, she is doing good, but then she quickly notices fatigue in her B LE muscles and worsening of her balance. Pt states she has recently been diagnosed with  polyneuropathy.   Pt states balance and B LE lower leg weakness (below her knees) are her greatest deficits.   Reports she also does yoga and tried to perform tree pose, standing on 1 foot, which made her realize her balance deficits.   Pt states she performs the below exercises at least every other day:  Single leg stance Yoga stretches in standing and sitting  Sun Pose Childs pose Forward and lateral foot taps Stopped doing deep knee bends due to pain, and MD recommended she discontinue this exercise Supine bicycle ab exercises Standing heel raises Seated piriformis/trunk rotation stretch Quadruped bird dogs Prone Superman  Pt states she gets on/off the floor to do her exercises and is able to do this successfully without support from a chair/counter.  Pt reports she has noticed she is weak when going to step-up onto the 1 step to enter/exit her back porch.  Pt reports increased postural sway during gait and will tend to lean towards the side her purse is on her shoulder.   Pt states she also doesn't sleep well, which impacts what she feels up to doing each day.   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 Severe sensory motor polyneuropathy confirmed by nerve conduction study. Sensory neuropathy contributes to balance issues due to impaired pressure sensation in feet, affecting her ability to make micro adjustments for balance. Lack of vibration sensation in feet noted, affecting balance.  Chronic denervation in limbs confirmed by EMG (05/2023). Polio diagnosed in the 1950s, likely contributing to current weakness and fatigue. Symptoms include leg weakness, fatigue, and balance issues. Post-polio syndrome contributes to muscle  weakness, affecting her ability to prevent falls. Emphasized the need for progressive muscle loading and avoiding overexertion.   PAIN:  Are you having pain? No, but pt does states she will sometimes have back pain if she stands for too long (especially when working in her kitchen)  PRECAUTIONS: Fall  RED FLAGS: None   WEIGHT  BEARING RESTRICTIONS: No  FALLS: Has patient fallen in last 6 months? No, but pt states sometimes she stumbles when getting out of the bed too quickly to get to the bathroom  LIVING ENVIRONMENT: Lives with: lives alone Lives in: Other 1 story townhouse Stairs: enters through garage mostly, which has ramped entrance; but does have 1 step to enter/exit front and back doors of home Has following equipment at home: Single point cane, Grab bars, and built in shower seat  PLOF: Independent, but has recently hired support to help with household tasks  PATIENT GOALS: Improve balance and strength legs  OBJECTIVE:  Note: Objective measures were completed at Evaluation unless otherwise noted.  DIAGNOSTIC FINDINGS:  Per MD note on 07/14/2023 NCS/EMG lower extremity (05/12/23) Impression: This is an abnormal electrodiagnostic study consistent with 1) generalized severe sensorimotor polyneuropathy. 2) EMG changes in both lower extremities shows signs of chronic denervation.  COGNITION: Overall cognitive status: Within functional limits for tasks assessed   SENSATION: Able to sense touch on screen, but pt reports LEs feel numb in some places and had NCS/EMG nerve conduction showing decreased sensation MD note stating absent vibratory sense  COORDINATION: WFL, abnormal movement patterns due to weakness, but not cerebellar incoordination  EDEMA:  Not formally assessed, none observed  MUSCLE TONE: Not formally assessed  MUSCLE LENGTH: Not formally assessed  DTRs:  Not formally assessed  POSTURE: rounded shoulders, forward head, and posterior pelvic  tilt  LOWER EXTREMITY ROM:     Active   WFL in B LEs for functional mobility tasks  Right Eval Left Eval  Hip flexion    Hip extension    Hip abduction    Hip adduction    Hip internal rotation    Hip external rotation    Knee flexion    Knee extension    Ankle dorsiflexion    Ankle plantarflexion    Ankle inversion    Ankle eversion     (Blank rows = not tested)  LOWER EXTREMITY MMT:    MMT Right Eval Left Eval  Hip flexion 3+ 3+  Hip extension    Hip abduction    Hip adduction    Hip internal rotation    Hip external rotation    Knee flexion 4- 4  Knee extension 4 4  Ankle dorsiflexion 3+ 3+  Ankle plantarflexion 4- 4-  Ankle inversion    Ankle eversion    (Blank rows = not tested)  Manual Muscle Test Scale 0/5 = No muscle contraction can be seen or felt 1/5 = Contraction can be felt, but there is no motion 2-/5 = Part moves through incomplete ROM w/ gravity decreased 2/5 = Part moves through complete ROM w/ gravity decreased 2+/5 = Part moves through incomplete ROM (<50%) against gravity or through complete ROM w/ gravity 3-/5 = Part moves through incomplete ROM (>50%) against gravity 3/5 = Part moves through complete ROM against gravity 3+/5 = Part moves through complete ROM against gravity/slight resistance 4-/5= Holds test position against slight to moderate pressure 4/5 = Part moves through complete ROM against gravity/moderate resistance 4+/5= Holds test position against moderate to strong pressure 5/5 = Part moves through complete ROM against gravity/full resistance  BED MOBILITY:  Not tested  TRANSFERS: Sit to stand: Complete Independence and Modified independence  Assistive device utilized: None     Stand to sit: Complete Independence and Modified independence  Assistive device utilized: None     Chair to chair: Complete Independence and Modified independence  Assistive device utilized: None       RAMP:  Not tested  CURB:  Not  tested  STAIRS: Findings: Level of Assistance: CGA, Stair Negotiation Technique: Alternating Pattern  with Bilateral Rails, Number of Stairs: 4, Height of Stairs: 6in   , and Comments: decreased ability to power up during ascent and decreased eccentric control on descent with pt relying on B UE support on the railings GAIT: Findings: Gait Characteristics: step through pattern, decreased step length- Right, decreased step length- Left, decreased hip/knee flexion- Right, decreased hip/knee flexion- Left, decreased trunk rotation, and wide BOS, Distance walked: ~174ft, Assistive device utilized:None, Level of assistance: SBA, and Comments: increased arm swing bilaterally, lack of trunk rotation, lack of full hip/knee flexion during swing with partial stiff legged gait pattern., slightly wider BOS    FUNCTIONAL TESTS:  5 times sit to stand: 9.77 seconds from green chair, no UE support 6 minute walk test: 915 ft on 7/10 with no AD 10 meter walk test:  0.86 m/s, no AD Berg Balance Scale: 45/56 on 7/10 Functional gait assessment: 16/30 on 7/10   PATIENT SURVEYS:  ABC scale: The Activities-Specific Balance Confidence (ABC) Scale 0% 10 20 30  40 50 60 70 80 90 100% No confidence<->completely confident  "How confident are you that you will not lose your balance or become unsteady when you . . .   Date tested 09/15/2023- corrected on 7/10 per pt request  Walk around the house 80%  2. Walk up or down stairs 30%  3. Bend over and pick up a slipper from in front of a closet floor 90%  4. Reach for a small can off a shelf at eye level 90%  5. Stand on tip toes and reach for something above your head 80%  6. Stand on a chair and reach for something 0%  7. Sweep the floor 90%  8. Walk outside the house to a car parked in the driveway 50%  9. Get into or out of a car 90%  10. Walk across a parking lot to the mall 90%  11. Walk up or down a ramp 100%  12. Walk in a crowded mall where people rapidly walk  past you 60%  13. Are bumped into by people as you walk through the mall 50%  14. Step onto or off of an escalator while you are holding onto the railing 20%  15. Step onto or off an escalator while holding onto parcels such that you cannot hold onto the railing 0%  16. Walk outside on icy sidewalks 0%  Total: #/16 57.5%                                                                                                                                 TREATMENT DATE: 09/22/2023  Provided education to patient on proper set-up of ankle DF exercise. Pt reports she feels prepared to advance theraband resistance - provided pt  with GTB.   Gait training 38ft, no AD, with CGA for steadying.   Progressed to head turning to visually scan and identify post-it notes on wall  This results in minor postural sway in each direction Pt recognizes she has difficulty with this and that she will veer towards direction she turns her head   B LE functional strengthening:  Repeated sit<>stands from green chair with airex pad to increase seat height and avoid deep knee bends 2x10reps  Pt reports feeling this in her quads Cuing to control movement into knee extension as she stands because pt locks knees back quickly Repeated step-ups onto 1st 6 stair with contralateral UE support 2x 6reps per LE Demos weakness in L hip on 2nd set with decreased ability to power up with hip extensors and hip abductors Side stepping down/back x3 laps with RTB resistance around knees for increased hip abductor activation (~6 steps each direction)   Pt reports I'm just starting to feel my knees...it is barely noticeable.  Standing balance interventions, at balance bar for safety: Single leg stance with contralateral LE forward and lateral foot taps to hedgehog targets 2x 8 reps standing on L LE - pt with greater difficulty maintaining standing balance on L LE due to hip weakness Requires frequent L UE support Improved motor  plan and balance on 2nd set after performing on R  X8reps standing on R LE with improved L foot clearance and balance due to increased R hip strength Requires min A for balance throughout - this was a great challenge for patient and she would benefit from additional interventions similar and progressing from this   PATIENT EDUCATION: Education details: PT POC, initial HEP, findings of assessment Person educated: Patient Education method: Explanation and Handouts Education comprehension: verbalized understanding and needs further education  HOME EXERCISE PROGRAM:  Access Code: [email or web address] URL: [email or web address] Date: 09/15/2023 Prepared by: Connell Kiss  Exercises - Seated Ankle Dorsiflexion with Anchored Resistance  - 1 x daily - 7 x weekly - 2 sets - 10 reps - Standing March with Counter Support  - 1 x daily - 7 x weekly - 2 sets - 10 reps    GOALS: Goals reviewed with patient? Yes  SHORT TERM GOALS: Target date: 10/27/2023  Patient will be independent with home exercise program to improve strength/mobility for better functional independence with ADLs and community level activities.  Baseline: initiated on 09/15/2023 Goal status: INITIAL  LONG TERM GOALS: Target date: 12/08/2023   Patient will increase Berg Balance score to > 51/56 to demonstrate improved balance and decreased fall risk during functional activities and ADLs.  Baseline: 45/56 on 7/10 Goal status: INITIAL  2.  Patient will increase six minute walk test distance to >1066ft for progression to community level ambulation, demonstrating improved gait endurance.  Baseline: 915 ft on 7/10 with no AD Goal status: INITIAL  3.  Patient will increase 10 meter walk test to >1.2m/s as to improve gait speed for better community ambulation and to reduce fall risk. Baseline: 0.86 m/s Goal status: INITIAL  4.  Patient will improve ABC scale score >80% to demonstrate increased confidence with functional  mobility and ADLs. Baseline: 57.5% corrected on 7/10 Goal status: INITIAL  5.  Patient will increase Functional Gait Assessment (FGA) score to >20/30 as to reduce fall risk and improve dynamic gait safety with community ambulation.  Baseline: 16/30 on 7/10 Goal status: INITIAL   ASSESSMENT:  CLINICAL IMPRESSION:  Therapy session focused on dynamic gait training, functional B  LE strengthening, and dynamic standing balance. Patient with minor imbalance during gait training when performing head turns and states she notices she will veer when turning her head while walking in community. Patient demonstrates L hip extensor and hip abductor weakness resulting in impaired standing balance on L LE as well as impaired ability to power-up through L LE during stair navigation. Patient will benefit from progression of further single leg standing balance interventions with contralateral LE movements/foot taps. Pt remained engaged throughout session and is motivated to improve her current condition. Ms. Lobban will benefit from further skilled PT to improve these deficits in order to increase QOL, decrease fall risk, and ease/safety with ADLs and community activities.   OBJECTIVE IMPAIRMENTS: Abnormal gait, decreased activity tolerance, decreased balance, decreased endurance, decreased mobility, difficulty walking, decreased strength, impaired sensation, and pain.   ACTIVITY LIMITATIONS: carrying, lifting, bending, standing, squatting, stairs, transfers, bathing, dressing, reach over head, and locomotion level  PARTICIPATION LIMITATIONS: meal prep, cleaning, laundry, shopping, community activity, and yard work  PERSONAL FACTORS: Age, Fitness, Time since onset of injury/illness/exacerbation, and 3+ comorbidities: Post-Polio Syndrome, Osteoporosis, Arthritis, GERD, HTN, Insomnia, and Melanoma are also affecting patient's functional outcome.   REHAB POTENTIAL: Good  CLINICAL DECISION MAKING: Evolving/moderate  complexity  EVALUATION COMPLEXITY: Moderate  PLAN:  PT FREQUENCY: 1-2x/week   PT DURATION: 12 weeks  PLANNED INTERVENTIONS: [id number]- PT Re-evaluation, 97750- Physical Performance Testing, 97110-Therapeutic exercises, 97530- Therapeutic activity, [id number]- Neuromuscular re-education, 97535- Self Care, [id number]- Manual therapy, [id number]- Gait training, [phone]- Orthotic Initial, [phone]- Orthotic/Prosthetic subsequent, [phone]- Canalith repositioning, [phone]- Electrical stimulation (manual), Patient/Family education, Balance training, Stair training, Joint mobilization, Vestibular training, DME instructions, Cryotherapy, Moist heat, and Biofeedback  PLAN FOR NEXT SESSION:  B LE strengthening within limits of knee pain Specifically L hip extensors and abductors Ankle DFs Hip flexors  Dynamic balance training weight shifting tasks to improve tolerance to single-leg stance - contralateral foot taps Dynamic gait training with head turns   Connell Kiss, PT, DPT, NCS, CSRS Physical Therapist - [place]  [facility]  2:00 PM 09/22/23

## 2023-09-29 ENCOUNTER — Ambulatory Visit: Admitting: Physical Therapy

## 2023-09-29 DIAGNOSIS — R531 Weakness: Secondary | ICD-10-CM | POA: Diagnosis not present

## 2023-09-29 DIAGNOSIS — R269 Unspecified abnormalities of gait and mobility: Secondary | ICD-10-CM

## 2023-09-29 DIAGNOSIS — R2681 Unsteadiness on feet: Secondary | ICD-10-CM

## 2023-09-29 NOTE — Therapy (Signed)
 OUTPATIENT PHYSICAL THERAPY NEURO TREATMENT   Patient Name: Brandy Wong MRN: [id number] DOB:December 19, 1934, 89 y.o., female Today's Date: 09/29/2023   PCP: Myrla Jon HERO, MD REFERRING PROVIDER: Maree Jannett POUR, MD   END OF SESSION:   PT End of Session - 09/29/23 1153     Visit Number 4    Number of Visits 24    Date for PT Re-Evaluation 12/08/23    PT Start Time 1150    PT Stop Time 1237    PT Time Calculation (min) 47 min    Equipment Utilized During Treatment Gait belt    Activity Tolerance Patient tolerated treatment well    Behavior During Therapy WFL for tasks assessed/performed             Past Medical History:  Diagnosis Date   Arthritis    Cancer (HCC) 2013   R breast, s/p radiation and lumpectomy   Cataract    R eye   Chronic cystitis    Female bladder prolapse    Frequent urination    GERD (gastroesophageal reflux disease)    Hypertension    Insomnia    Melanoma (HCC)    left upper limb   Squamous cell carcinoma in situ    Past Surgical History:  Procedure Laterality Date   APPENDECTOMY     BREAST EXCISIONAL BIOPSY Left    benign   BREAST EXCISIONAL BIOPSY Left    benign   BREAST SURGERY Right 2013   lumpectomy post positive breast ca, radiation   CATARACT EXTRACTION     LAPAROSCOPIC VAGINAL HYSTERECTOMY WITH SALPINGO OOPHORECTOMY     TUBAL LIGATION     Patient Active Problem List   Diagnosis Date Noted   Hot flashes 08/04/2023   Hyperlipidemia 06/23/2023   History of recurrent UTIs 06/23/2023   Chronic cystitis 06/23/2023   Melanoma of skin (HCC) 06/23/2023   Osteoporosis 06/23/2022   Syncope 06/23/2022   Senile purpura (HCC) 12/19/2021   Vasomotor symptoms due to menopause 07/29/2021   Chronic cough 11/15/2020   Non-healing wound of lower extremity 11/15/2020   Synovial cyst of hand 11/15/2020   Insomnia 11/08/2018   Balance problem 11/08/2018   GERD (gastroesophageal reflux disease) 10/31/2016   Essential hypertension  10/31/2016   Avitaminosis D 10/31/2016   History of uterine prolapse 05/19/2013   History of breast cancer 11/05/2012   SUI (stress urinary incontinence, female) 03/18/2012   Pelvic relaxation due to enterocele, vaginal 12/16/2011    ONSET DATE: ~1-2 years ago  REFERRING DIAG: G62.9 (ICD-10-CM) - Polyneuropathy, unspecified   THERAPY DIAG:  Unsteadiness on feet  Generalized weakness  Abnormality of gait  Rationale for Evaluation and Treatment: Rehabilitation  SUBJECTIVE:  SUBJECTIVE STATEMENT:  Pt reports she is feeling a little wobbly today again. States she noticed it the most when walking down the long hallway into therapy clinic. Patient reports the shoes she has on today are really grippy and they catch the floor easily, slightly tripping her, but she is able to catch herself.  Pt reports HEP is going well. Pt states her R knee was feeling a little sore on medial joint line after last session, but it went away that same day. States she previously had tendonitis in that area.  Denies falls. Denies pain.     FROM EVAL: Pt states MD believes her symptoms are a result of post-polio. Pt states when she had polio at 14 or 89y.o., she went to rehab for 4 months, and came home and was completely independent without aids/braces. Pt states she golfed for ~50years to stay active, reports she stopped playing ~3 years ago due to concern of falling on uneven/compliant surface of grass. Pt states now she walks her dog 2x/day for exercise. Pt states when she starts the walk, she is doing good, but then she quickly notices fatigue in her B LE muscles and worsening of her balance. Pt states she has recently been diagnosed with polyneuropathy.   Pt states balance and B LE lower leg weakness (below her knees)  are her greatest deficits.   Reports she also does yoga and tried to perform tree pose, standing on 1 foot, which made her realize her balance deficits.   Pt states she performs the below exercises at least every other day:  Single leg stance Yoga stretches in standing and sitting  Sun Pose Childs pose Forward and lateral foot taps Stopped doing deep knee bends due to pain, and MD recommended she discontinue this exercise Supine bicycle ab exercises Standing heel raises Seated piriformis/trunk rotation stretch Quadruped bird dogs Prone Superman  Pt states she gets on/off the floor to do her exercises and is able to do this successfully without support from a chair/counter.  Pt reports she has noticed she is weak when going to step-up onto the 1 step to enter/exit her back porch.  Pt reports increased postural sway during gait and will tend to lean towards the side her purse is on her shoulder.   Pt states she also doesn't sleep well, which impacts what she feels up to doing each day.   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 Severe sensory motor polyneuropathy confirmed by nerve conduction study. Sensory neuropathy contributes to balance issues due to impaired pressure sensation in feet, affecting her ability to make micro adjustments for balance. Lack of vibration sensation in feet noted, affecting balance.  Chronic denervation in limbs confirmed by EMG (05/2023). Polio diagnosed in the 1950s, likely contributing to current weakness and fatigue. Symptoms include leg weakness, fatigue, and balance issues. Post-polio syndrome contributes to muscle weakness, affecting her ability to prevent falls. Emphasized the need for  progressive muscle loading and avoiding overexertion.   PAIN:  Are you having pain? No, but pt does states she will sometimes have back pain if she stands for too long (especially when working in her kitchen)  PRECAUTIONS: Fall  RED FLAGS: None   WEIGHT BEARING RESTRICTIONS: No  FALLS: Has patient fallen in last 6 months? No, but pt  states sometimes she stumbles when getting out of the bed too quickly to get to the bathroom  LIVING ENVIRONMENT: Lives with: lives alone Lives in: Other 1 story townhouse Stairs: enters through garage mostly, which has ramped entrance; but does have 1 step to enter/exit front and back doors of home Has following equipment at home: Single point cane, Grab bars, and built in shower seat  PLOF: Independent, but has recently hired support to help with household tasks  PATIENT GOALS: Improve balance and strength legs  OBJECTIVE:  Note: Objective measures were completed at Evaluation unless otherwise noted.  DIAGNOSTIC FINDINGS:  Per MD note on 07/14/2023 NCS/EMG lower extremity (05/12/23) Impression: This is an abnormal electrodiagnostic study consistent with 1) generalized severe sensorimotor polyneuropathy. 2) EMG changes in both lower extremities shows signs of chronic denervation.  COGNITION: Overall cognitive status: Within functional limits for tasks assessed   SENSATION: Able to sense touch on screen, but pt reports LEs feel numb in some places and had NCS/EMG nerve conduction showing decreased sensation MD note stating absent vibratory sense  COORDINATION: WFL, abnormal movement patterns due to weakness, but not cerebellar incoordination  EDEMA:  Not formally assessed, none observed  MUSCLE TONE: Not formally assessed  MUSCLE LENGTH: Not formally assessed  DTRs:  Not formally assessed  POSTURE: rounded shoulders, forward head, and posterior pelvic tilt  LOWER EXTREMITY ROM:     Active   WFL in B LEs for functional  mobility tasks  Right Eval Left Eval  Hip flexion    Hip extension    Hip abduction    Hip adduction    Hip internal rotation    Hip external rotation    Knee flexion    Knee extension    Ankle dorsiflexion    Ankle plantarflexion    Ankle inversion    Ankle eversion     (Blank rows = not tested)  LOWER EXTREMITY MMT:    MMT Right Eval Left Eval  Hip flexion 3+ 3+  Hip extension    Hip abduction    Hip adduction    Hip internal rotation    Hip external rotation    Knee flexion 4- 4  Knee extension 4 4  Ankle dorsiflexion 3+ 3+  Ankle plantarflexion 4- 4-  Ankle inversion    Ankle eversion    (Blank rows = not tested)  Manual Muscle Test Scale 0/5 = No muscle contraction can be seen or felt 1/5 = Contraction can be felt, but there is no motion 2-/5 = Part moves through incomplete ROM w/ gravity decreased 2/5 = Part moves through complete ROM w/ gravity decreased 2+/5 = Part moves through incomplete ROM (<50%) against gravity or through complete ROM w/ gravity 3-/5 = Part moves through incomplete ROM (>50%) against gravity 3/5 = Part moves through complete ROM against gravity 3+/5 = Part moves through complete ROM against gravity/slight resistance 4-/5= Holds test position against slight to moderate pressure 4/5 = Part moves through complete ROM against gravity/moderate resistance 4+/5= Holds test position against moderate to strong pressure 5/5 = Part moves through complete ROM against gravity/full resistance  BED MOBILITY:  Not tested  TRANSFERS: Sit to stand: Complete Independence and Modified independence  Assistive device utilized: None     Stand to sit: Complete Independence and Modified independence  Assistive device utilized: None     Chair to chair: Complete Independence and Modified independence  Assistive device utilized: None       RAMP:  Not tested  CURB:  Not tested  STAIRS: Findings: Level of Assistance: CGA, Stair Negotiation Technique:  Alternating Pattern  with Bilateral Rails, Number of Stairs: 4, Height of Stairs: 6in   , and Comments: decreased ability to power up during ascent and decreased eccentric control on descent with pt relying on B UE support on the railings GAIT: Findings: Gait Characteristics: step through pattern, decreased step length- Right, decreased step length- Left, decreased hip/knee flexion- Right, decreased hip/knee flexion- Left, decreased trunk rotation, and wide BOS, Distance walked: ~111ft, Assistive device utilized:None, Level of assistance: SBA, and Comments: increased arm swing bilaterally, lack of trunk rotation, lack of full hip/knee flexion during swing with partial stiff legged gait pattern., slightly wider BOS    FUNCTIONAL TESTS:  5 times sit to stand: 9.77 seconds from green chair, no UE support 6 minute walk test: 915 ft on 7/10 with no AD 10 meter walk test:  0.86 m/s, no AD Berg Balance Scale: 45/56 on 7/10 Functional gait assessment: 16/30 on 7/10   PATIENT SURVEYS:  ABC scale: The Activities-Specific Balance Confidence (ABC) Scale 0% 10 20 30  40 50 60 70 80 90 100% No confidence<->completely confident  "How confident are you that you will not lose your balance or become unsteady when you . . .   Date tested 09/15/2023- corrected on 7/10 per pt request  Walk around the house 80%  2. Walk up or down stairs 30%  3. Bend over and pick up a slipper from in front of a closet floor 90%  4. Reach for a small can off a shelf at eye level 90%  5. Stand on tip toes and reach for something above your head 80%  6. Stand on a chair and reach for something 0%  7. Sweep the floor 90%  8. Walk outside the house to a car parked in the driveway 50%  9. Get into or out of a car 90%  10. Walk across a parking lot to the mall 90%  11. Walk up or down a ramp 100%  12. Walk in a crowded mall where people rapidly walk past you 60%  13. Are bumped into by people as you walk through the mall 50%  14.  Step onto or off of an escalator while you are holding onto the railing 20%  15. Step onto or off an escalator while holding onto parcels such that you cannot hold onto the railing 0%  16. Walk outside on icy sidewalks 0%  Total: #/16 57.5%                                                                                                                                 TREATMENT DATE: 09/29/2023  Patient reports she has tried different techniques to improve ankle DF exercise on HEP and is continuing to problem solve how to set it up properly at home.  Forward gait training 35ft, no AD, with CGA for  steadying.   Dual-task of head turning to visually scan and identify post-it notes on wall  This results in minor postural sway in each direction Pt recognizes she has difficulty with this and that she will veer towards direction she turns her head  Progressed to cards held up by therapist to promote incresaed head turn movement with pt continuing to have postural sway  Backwards dynamic gait training ~48ft with CGA/light min A for steadying Performing dual-task challenge of head turns to identify post-it notes on Very short, shuffled backwards steps with decreased speed Pt with instability in both directions and this was very challenging   B LE functional strengthening:  Repeated sit<>stands from green chair with airex pad to increase seat height and avoid deep knee bends x10reps then added 3lb dumbbells in each hand 2x10reps Cuing to control movement into knee extension as she stands because pt locks knees back quickly Repeated step-ups onto green step with 2x purple plate with contralateral UE support 2x 6reps per LE Progressed to final 3 reps without UE support on 1st set and then performed 2nd set without UE support - requires min A for balance and lifting support Continues to demo decreased glute activation when stepping up with each LE, but successful bilaterally  Pt reports I'm just  starting to feel my knees...it is barely noticeable. Transitioned side stepping to standing hip abduction exercise RTB resistance 2x15 reps per LE  Standing with B UE support on balance bar and theraband wrapped around her LEs to simulate how she would set-up at home Therapist providing mod cuing for proper form/technique to target hip abductors Added this exercise to pt's HEP and pt demos ability to don/doff theraband using counter support   Standing balance interventions, at balance bar for safety: Single leg stance with contralateral LE forward and lateral foot taps to hedgehog targets x 8 reps standing on L LE - pt with greater difficulty maintaining standing balance on L LE due to hip weakness Able to do without UE support, but frequently tapping foot down to regain balance X8reps standing on R LE with improved L foot clearance and balance due to increased R hip strength Requires CGA/min A for balance throughout - this was a great challenge for patient and she would benefit from additional interventions similar and progressing from this   PATIENT EDUCATION: Education details: PT POC, initial HEP, findings of assessment Person educated: Patient Education method: Explanation and Handouts Education comprehension: verbalized understanding and needs further education  HOME EXERCISE PROGRAM:  Access Code: [email or web address] URL: [email or web address] Date: 09/29/2023 Prepared by: Connell Kiss  Exercises - Seated Ankle Dorsiflexion with Anchored Resistance  - 1 x daily - 7 x weekly - 2 sets - 10 reps - Standing March with Counter Support  - 1 x daily - 7 x weekly - 2 sets - 10 reps - Standing Hip Abduction with Resistance at Ankles and Counter Support  - 1 x daily - 3 x weekly - 2 sets - 10 reps   GOALS: Goals reviewed with patient? Yes  SHORT TERM GOALS: Target date: 10/27/2023  Patient will be independent with home exercise program to improve strength/mobility for better  functional independence with ADLs and community level activities.  Baseline: initiated on 09/15/2023 Goal status: INITIAL  LONG TERM GOALS: Target date: 12/08/2023   Patient will increase Berg Balance score to > 51/56 to demonstrate improved balance and decreased fall risk during functional activities and ADLs.  Baseline: 45/56 on 7/10 Goal status: INITIAL  2.  Patient will increase six minute walk test distance to >1014ft for progression to community level ambulation, demonstrating improved gait endurance.  Baseline: 915 ft on 7/10 with no AD Goal status: INITIAL  3.  Patient will increase 10 meter walk test to >1.69m/s as to improve gait speed for better community ambulation and to reduce fall risk. Baseline: 0.86 m/s Goal status: INITIAL  4.  Patient will improve ABC scale score >80% to demonstrate increased confidence with functional mobility and ADLs. Baseline: 57.5% corrected on 7/10 Goal status: INITIAL  5.  Patient will increase Functional Gait Assessment (FGA) score to >20/30 as to reduce fall risk and improve dynamic gait safety with community ambulation.  Baseline: 16/30 on 7/10 Goal status: INITIAL   ASSESSMENT:  CLINICAL IMPRESSION:  Therapy session focused on dynamic gait training, functional B LE strengthening, and dynamic standing balance. Patient continues to have minor imbalance during gait training when performing head turns, but was able to progress to backwards gait training with this today, which was very challenging. Patient demonstrates L hip extensor and hip abductor weakness resulting in impaired standing balance on L LE as well as impaired ability to power-up through L LE during step-up exercise requiring min A. Patient will benefit from progression of further single leg standing balance interventions with contralateral LE movements/foot taps. Pt remained engaged throughout session and is motivated to improve her current condition. Therapist added standing hip  abduction exercise to pt's HEP to further target these strength deficits. Ms. Peachey will benefit from further skilled PT to improve these deficits in order to increase QOL, decrease fall risk, and ease/safety with ADLs and community activities.   OBJECTIVE IMPAIRMENTS: Abnormal gait, decreased activity tolerance, decreased balance, decreased endurance, decreased mobility, difficulty walking, decreased strength, impaired sensation, and pain.   ACTIVITY LIMITATIONS: carrying, lifting, bending, standing, squatting, stairs, transfers, bathing, dressing, reach over head, and locomotion level  PARTICIPATION LIMITATIONS: meal prep, cleaning, laundry, shopping, community activity, and yard work  PERSONAL FACTORS: Age, Fitness, Time since onset of injury/illness/exacerbation, and 3+ comorbidities: Post-Polio Syndrome, Osteoporosis, Arthritis, GERD, HTN, Insomnia, and Melanoma are also affecting patient's functional outcome.   REHAB POTENTIAL: Good  CLINICAL DECISION MAKING: Evolving/moderate complexity  EVALUATION COMPLEXITY: Moderate  PLAN:  PT FREQUENCY: 1-2x/week   PT DURATION: 12 weeks  PLANNED INTERVENTIONS: [id number]- PT Re-evaluation, 97750- Physical Performance Testing, 97110-Therapeutic exercises, 97530- Therapeutic activity, [id number]- Neuromuscular re-education, 97535- Self Care, [id number]- Manual therapy, [id number]- Gait training, [phone]- Orthotic Initial, [phone]- Orthotic/Prosthetic subsequent, [phone]- Canalith repositioning, [phone]- Electrical stimulation (manual), Patient/Family education, Balance training, Stair training, Joint mobilization, Vestibular training, DME instructions, Cryotherapy, Moist heat, and Biofeedback  PLAN FOR NEXT SESSION:  B LE strengthening within limits of knee pain Specifically L hip extensors and abductors Ankle DFs Hip flexors  Dynamic balance training weight shifting tasks to improve tolerance to single-leg stance - contralateral foot taps Dynamic gait training with  head turns Continue   Carine Nordgren, PT, DPT, NCS, CSRS Physical Therapist - [place]  [facility]  12:40 PM 09/29/23

## 2023-10-01 ENCOUNTER — Ambulatory Visit

## 2023-10-01 DIAGNOSIS — R269 Unspecified abnormalities of gait and mobility: Secondary | ICD-10-CM | POA: Diagnosis not present

## 2023-10-01 DIAGNOSIS — R2681 Unsteadiness on feet: Secondary | ICD-10-CM | POA: Diagnosis not present

## 2023-10-01 DIAGNOSIS — R531 Weakness: Secondary | ICD-10-CM

## 2023-10-01 NOTE — Therapy (Signed)
 OUTPATIENT PHYSICAL THERAPY NEURO TREATMENT   Patient Name: Brandy Wong MRN: [id number] DOB:[birth year], 89 y.o., female Today's Date: 10/01/2023   PCP: Myrla Jon HERO, MD REFERRING PROVIDER: Maree Jannett POUR, MD   END OF SESSION:   PT End of Session - 10/01/23 1529     Visit Number 5    Number of Visits 24    Date for PT Re-Evaluation 12/08/23    PT Start Time 1530    PT Stop Time 1610    PT Time Calculation (min) 40 min    Equipment Utilized During Treatment Gait belt    Activity Tolerance Patient tolerated treatment well    Behavior During Therapy WFL for tasks assessed/performed              Past Medical History:  Diagnosis Date   Arthritis    Cancer (HCC) 2013   R breast, s/p radiation and lumpectomy   Cataract    R eye   Chronic cystitis    Female bladder prolapse    Frequent urination    GERD (gastroesophageal reflux disease)    Hypertension    Insomnia    Melanoma (HCC)    left upper limb   Squamous cell carcinoma in situ    Past Surgical History:  Procedure Laterality Date   APPENDECTOMY     BREAST EXCISIONAL BIOPSY Left    benign   BREAST EXCISIONAL BIOPSY Left    benign   BREAST SURGERY Right 2013   lumpectomy post positive breast ca, radiation   CATARACT EXTRACTION     LAPAROSCOPIC VAGINAL HYSTERECTOMY WITH SALPINGO OOPHORECTOMY     TUBAL LIGATION     Patient Active Problem List   Diagnosis Date Noted   Hot flashes 08/04/2023   Hyperlipidemia 06/23/2023   History of recurrent UTIs 06/23/2023   Chronic cystitis 06/23/2023   Melanoma of skin (HCC) 06/23/2023   Osteoporosis 06/23/2022   Syncope 06/23/2022   Senile purpura (HCC) 12/19/2021   Vasomotor symptoms due to menopause 07/29/2021   Chronic cough 11/15/2020   Non-healing wound of lower extremity 11/15/2020   Synovial cyst of hand 11/15/2020   Insomnia 11/08/2018   Balance problem 11/08/2018   GERD (gastroesophageal reflux disease) 10/31/2016   Essential hypertension  10/31/2016   Avitaminosis D 10/31/2016   History of uterine prolapse 05/19/2013   History of breast cancer 11/05/2012   SUI (stress urinary incontinence, female) 03/18/2012   Pelvic relaxation due to enterocele, vaginal 12/16/2011    ONSET DATE: ~1-2 years ago  REFERRING DIAG: G62.9 (ICD-10-CM) - Polyneuropathy, unspecified   THERAPY DIAG:  Unsteadiness on feet  Generalized weakness  Abnormality of gait  Rationale for Evaluation and Treatment: Rehabilitation  SUBJECTIVE:  SUBJECTIVE STATEMENT: Pt states that she felt okay after previous session. Pt states that she feels okay today, just a bit tired from being up early this morning.     FROM EVAL: Pt states MD believes her symptoms are a result of post-polio. Pt states when she had polio at 14 or 89y.o., she went to rehab for 4 months, and came home and was completely independent without aids/braces. Pt states she golfed for ~50years to stay active, reports she stopped playing ~3 years ago due to concern of falling on uneven/compliant surface of grass. Pt states now she walks her dog 2x/day for exercise. Pt states when she starts the walk, she is doing good, but then she quickly notices fatigue in her B LE muscles and worsening of her balance. Pt states she has recently been diagnosed with polyneuropathy.   Pt states balance and B LE lower leg weakness (below her knees) are her greatest deficits.   Reports she also does yoga and tried to perform tree pose, standing on 1 foot, which made her realize her balance deficits.   Pt states she performs the below exercises at least every other day:  Single leg stance Yoga stretches in standing and sitting  Sun Pose Childs pose Forward and lateral foot taps Stopped doing deep knee bends due to pain, and  MD recommended she discontinue this exercise Supine bicycle ab exercises Standing heel raises Seated piriformis/trunk rotation stretch Quadruped bird dogs Prone Superman  Pt states she gets on/off the floor to do her exercises and is able to do this successfully without support from a chair/counter.  Pt reports she has noticed she is weak when going to step-up onto the 1 step to enter/exit her back porch.  Pt reports increased postural sway during gait and will tend to lean towards the side her purse is on her shoulder.   Pt states she also doesn't sleep well, which impacts what she feels up to doing each day.   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 Severe sensory motor polyneuropathy confirmed by nerve conduction study. Sensory neuropathy contributes to balance issues due to impaired pressure sensation in feet, affecting her ability to make micro adjustments for balance. Lack of vibration sensation in feet noted, affecting balance.  Chronic denervation in limbs confirmed by EMG (05/2023). Polio diagnosed in the 1950s, likely contributing to current weakness and fatigue. Symptoms include leg weakness, fatigue, and balance issues. Post-polio syndrome contributes to muscle weakness, affecting her ability to prevent falls. Emphasized the need for progressive muscle loading and avoiding overexertion.   PAIN:  Are you having pain? No, but pt does states she will sometimes have back pain if she stands for too long (especially when working in her kitchen)  PRECAUTIONS: Fall  RED FLAGS: None   WEIGHT BEARING RESTRICTIONS: No  FALLS: Has patient fallen in last 6 months? No, but pt states sometimes she stumbles when getting out of  the bed too quickly to get to the bathroom  LIVING ENVIRONMENT: Lives with: lives alone Lives in: Other 1 story townhouse Stairs: enters through garage mostly, which has ramped entrance; but does have 1 step to enter/exit front and back doors of home Has following equipment at home: Single point cane, Grab bars, and built in shower seat  PLOF: Independent, but  has recently hired support to help with household tasks  PATIENT GOALS: Improve balance and strength legs  OBJECTIVE:  Note: Objective measures were completed at Evaluation unless otherwise noted.  DIAGNOSTIC FINDINGS:  Per MD note on 07/14/2023 NCS/EMG lower extremity (05/12/23) Impression: This is an abnormal electrodiagnostic study consistent with 1) generalized severe sensorimotor polyneuropathy. 2) EMG changes in both lower extremities shows signs of chronic denervation.  COGNITION: Overall cognitive status: Within functional limits for tasks assessed   SENSATION: Able to sense touch on screen, but pt reports LEs feel numb in some places and had NCS/EMG nerve conduction showing decreased sensation MD note stating absent vibratory sense  COORDINATION: WFL, abnormal movement patterns due to weakness, but not cerebellar incoordination  EDEMA:  Not formally assessed, none observed  MUSCLE TONE: Not formally assessed  MUSCLE LENGTH: Not formally assessed  DTRs:  Not formally assessed  POSTURE: rounded shoulders, forward head, and posterior pelvic tilt  LOWER EXTREMITY ROM:     Active   WFL in B LEs for functional mobility tasks  Right Eval Left Eval  Hip flexion    Hip extension    Hip abduction    Hip adduction    Hip internal rotation    Hip external rotation    Knee flexion    Knee extension    Ankle dorsiflexion    Ankle plantarflexion    Ankle inversion    Ankle eversion     (Blank rows = not tested)  LOWER EXTREMITY MMT:    MMT Right Eval Left Eval  Hip flexion 3+ 3+  Hip extension     Hip abduction    Hip adduction    Hip internal rotation    Hip external rotation    Knee flexion 4- 4  Knee extension 4 4  Ankle dorsiflexion 3+ 3+  Ankle plantarflexion 4- 4-  Ankle inversion    Ankle eversion    (Blank rows = not tested)  Manual Muscle Test Scale 0/5 = No muscle contraction can be seen or felt 1/5 = Contraction can be felt, but there is no motion 2-/5 = Part moves through incomplete ROM w/ gravity decreased 2/5 = Part moves through complete ROM w/ gravity decreased 2+/5 = Part moves through incomplete ROM (<50%) against gravity or through complete ROM w/ gravity 3-/5 = Part moves through incomplete ROM (>50%) against gravity 3/5 = Part moves through complete ROM against gravity 3+/5 = Part moves through complete ROM against gravity/slight resistance 4-/5= Holds test position against slight to moderate pressure 4/5 = Part moves through complete ROM against gravity/moderate resistance 4+/5= Holds test position against moderate to strong pressure 5/5 = Part moves through complete ROM against gravity/full resistance  BED MOBILITY:  Not tested  TRANSFERS: Sit to stand: Complete Independence and Modified independence  Assistive device utilized: None     Stand to sit: Complete Independence and Modified independence  Assistive device utilized: None     Chair to chair: Complete Independence and Modified independence  Assistive device utilized: None       RAMP:  Not tested  CURB:  Not tested  STAIRS: Findings: Level of Assistance: CGA, Stair Negotiation Technique: Alternating Pattern  with Bilateral Rails, Number of Stairs: 4, Height of Stairs: 6in   , and Comments: decreased ability to power up during ascent and decreased eccentric control on descent with pt relying on B UE support on the railings GAIT: Findings: Gait Characteristics: step through pattern, decreased step length- Right, decreased step length-  Left, decreased hip/knee flexion- Right, decreased  hip/knee flexion- Left, decreased trunk rotation, and wide BOS, Distance walked: ~124ft, Assistive device utilized:None, Level of assistance: SBA, and Comments: increased arm swing bilaterally, lack of trunk rotation, lack of full hip/knee flexion during swing with partial stiff legged gait pattern., slightly wider BOS    FUNCTIONAL TESTS:  5 times sit to stand: 9.77 seconds from green chair, no UE support 6 minute walk test: 915 ft on 7/10 with no AD 10 meter walk test:  0.86 m/s, no AD Berg Balance Scale: 45/56 on 7/10 Functional gait assessment: 16/30 on 7/10   PATIENT SURVEYS:  ABC scale: The Activities-Specific Balance Confidence (ABC) Scale 0% 10 20 30  40 50 60 70 80 90 100% No confidence<->completely confident  "How confident are you that you will not lose your balance or become unsteady when you . . .   Date tested 09/15/2023- corrected on 7/10 per pt request  Walk around the house 80%  2. Walk up or down stairs 30%  3. Bend over and pick up a slipper from in front of a closet floor 90%  4. Reach for a small can off a shelf at eye level 90%  5. Stand on tip toes and reach for something above your head 80%  6. Stand on a chair and reach for something 0%  7. Sweep the floor 90%  8. Walk outside the house to a car parked in the driveway 50%  9. Get into or out of a car 90%  10. Walk across a parking lot to the mall 90%  11. Walk up or down a ramp 100%  12. Walk in a crowded mall where people rapidly walk past you 60%  13. Are bumped into by people as you walk through the mall 50%  14. Step onto or off of an escalator while you are holding onto the railing 20%  15. Step onto or off an escalator while holding onto parcels such that you cannot hold onto the railing 0%  16. Walk outside on icy sidewalks 0%  Total: #/16 57.5%                                                                                                                                 TREATMENT DATE:  10/01/2023  Ther-ex: Standing at support bar: - Straight leg abduction x15 each LE with 1.5# AW donned - Straight leg glute kickbacks x15 each LE with 1.5# AW donned  Seated: - LAQ with 1.5# AW donned x15 each LE - Toe taps over orange hurdle with 2.5# AW donned 2x10 each LE - Straight leg rainbows with 2.5# AW donned x10 each LE   - Dynadisc ankle PF/DF x15 each LE - Adduction squeeze with heel raise x15   Neuro Re-ed: Dynamic balance circuit on Airex pad- 30 seconds of each: - Static stance - Narrowed stance - Eyes closed- pt has tendency to lean anteriorly,  intermittent UE support  - Horizontal head turns - Vertical Head turns - Marches- pt required UE support for this task  Ambulate in hallway: -horizontal head turns with cues for reading alphabet from cards 86 ftx 2 sets -horizontal head turns with cues for reading number and symbols from cards 86 ft x 2 sets  -"red light/green light" for sudden initiation/termination of ambulation with close CGA for carryover to natural environment 2x 86 ft   Standing toe taps on hedgehog forward, lateral, backward x10 each LE ; 2 rounds- pt rates task as hard  PATIENT EDUCATION: Education details: PT POC, initial HEP, findings of assessment Person educated: Patient Education method: Explanation and Handouts Education comprehension: verbalized understanding and needs further education  HOME EXERCISE PROGRAM:  Access Code: [email or web address] URL: [email or web address] Date: 09/29/2023 Prepared by: Connell Kiss  Exercises - Seated Ankle Dorsiflexion with Anchored Resistance  - 1 x daily - 7 x weekly - 2 sets - 10 reps - Standing March with Counter Support  - 1 x daily - 7 x weekly - 2 sets - 10 reps - Standing Hip Abduction with Resistance at Ankles and Counter Support  - 1 x daily - 3 x weekly - 2 sets - 10 reps   GOALS: Goals reviewed with patient? Yes  SHORT TERM GOALS: Target date: 10/27/2023  Patient will be  independent with home exercise program to improve strength/mobility for better functional independence with ADLs and community level activities.  Baseline: initiated on 09/15/2023 Goal status: INITIAL  LONG TERM GOALS: Target date: 12/08/2023   Patient will increase Berg Balance score to > 51/56 to demonstrate improved balance and decreased fall risk during functional activities and ADLs.  Baseline: 45/56 on 7/10 Goal status: INITIAL  2.  Patient will increase six minute walk test distance to >1014ft for progression to community level ambulation, demonstrating improved gait endurance.  Baseline: 915 ft on 7/10 with no AD Goal status: INITIAL  3.  Patient will increase 10 meter walk test to >1.37m/s as to improve gait speed for better community ambulation and to reduce fall risk. Baseline: 0.86 m/s Goal status: INITIAL  4.  Patient will improve ABC scale score >80% to demonstrate increased confidence with functional mobility and ADLs. Baseline: 57.5% corrected on 7/10 Goal status: INITIAL  5.  Patient will increase Functional Gait Assessment (FGA) score to >20/30 as to reduce fall risk and improve dynamic gait safety with community ambulation.  Baseline: 16/30 on 7/10 Goal status: INITIAL   ASSESSMENT:  CLINICAL IMPRESSION:  Pt tolerated all PT interventions well without a reported increase in knee pain. Pt continues to demonstrate difficulty with single-leg stance/balance, requiring UE support for all activities targeted at challenging tolerance to SLS. Pt remained engaged throughout session and is motivated to continue improving her balance and strength. Brandy Wong will benefit from further skilled PT to improve these deficits in order to increase QOL, decrease fall risk, and ease/safety with ADLs and community activities.   OBJECTIVE IMPAIRMENTS: Abnormal gait, decreased activity tolerance, decreased balance, decreased endurance, decreased mobility, difficulty walking, decreased  strength, impaired sensation, and pain.   ACTIVITY LIMITATIONS: carrying, lifting, bending, standing, squatting, stairs, transfers, bathing, dressing, reach over head, and locomotion level  PARTICIPATION LIMITATIONS: meal prep, cleaning, laundry, shopping, community activity, and yard work  PERSONAL FACTORS: Age, Fitness, Time since onset of injury/illness/exacerbation, and 3+ comorbidities: Post-Polio Syndrome, Osteoporosis, Arthritis, GERD, HTN, Insomnia, and Melanoma are also affecting patient's functional outcome.   REHAB POTENTIAL: Good  CLINICAL DECISION MAKING:  Evolving/moderate complexity  EVALUATION COMPLEXITY: Moderate  PLAN:  PT FREQUENCY: 1-2x/week   PT DURATION: 12 weeks  PLANNED INTERVENTIONS: [id number]- PT Re-evaluation, 97750- Physical Performance Testing, 97110-Therapeutic exercises, 97530- Therapeutic activity, [id number]- Neuromuscular re-education, 97535- Self Care, [id number]- Manual therapy, [id number]- Gait training, [phone]- Orthotic Initial, [phone]- Orthotic/Prosthetic subsequent, [phone]- Canalith repositioning, [phone]- Electrical stimulation (manual), Patient/Family education, Balance training, Stair training, Joint mobilization, Vestibular training, DME instructions, Cryotherapy, Moist heat, and Biofeedback  PLAN FOR NEXT SESSION:  B LE strengthening within limits of knee pain Specifically L hip extensors and abductors Ankle DFs Hip flexors  Dynamic balance training weight shifting tasks to improve tolerance to single-leg stance - contralateral foot taps Dynamic gait training with head turns   Brandy Wong, SPT  This entire session was performed under direct supervision and direction of a licensed therapist/therapist assistant . I have personally read, edited and approve of the note as written.  Brandy  Wong, PT, DPT Physical Therapist - [facility]  Outpatient Physical Therapy- Main Campus [phone]     5:11 PM 10/01/23

## 2023-10-06 ENCOUNTER — Ambulatory Visit

## 2023-10-06 DIAGNOSIS — R2681 Unsteadiness on feet: Secondary | ICD-10-CM | POA: Diagnosis not present

## 2023-10-06 DIAGNOSIS — R269 Unspecified abnormalities of gait and mobility: Secondary | ICD-10-CM | POA: Diagnosis not present

## 2023-10-06 DIAGNOSIS — R531 Weakness: Secondary | ICD-10-CM

## 2023-10-06 NOTE — Therapy (Signed)
 OUTPATIENT PHYSICAL THERAPY NEURO TREATMENT   Patient Name: Brandy Wong MRN: [id number] DOB:11/01/1934, 89 y.o., female Today's Date: 10/06/2023   PCP: Myrla Jon HERO, MD REFERRING PROVIDER: Maree Jannett POUR, MD   END OF SESSION:   PT End of Session - 10/06/23 1439     Visit Number 6    Number of Visits 24    Date for PT Re-Evaluation 12/08/23    Progress Note Due on Visit 10    PT Start Time 1443    PT Stop Time 1528    PT Time Calculation (min) 45 min    Equipment Utilized During Treatment Gait belt    Activity Tolerance Patient tolerated treatment well    Behavior During Therapy WFL for tasks assessed/performed              Past Medical History:  Diagnosis Date   Arthritis    Cancer (HCC) 2013   R breast, s/p radiation and lumpectomy   Cataract    R eye   Chronic cystitis    Female bladder prolapse    Frequent urination    GERD (gastroesophageal reflux disease)    Hypertension    Insomnia    Melanoma (HCC)    left upper limb   Squamous cell carcinoma in situ    Past Surgical History:  Procedure Laterality Date   APPENDECTOMY     BREAST EXCISIONAL BIOPSY Left    benign   BREAST EXCISIONAL BIOPSY Left    benign   BREAST SURGERY Right 2013   lumpectomy post positive breast ca, radiation   CATARACT EXTRACTION     LAPAROSCOPIC VAGINAL HYSTERECTOMY WITH SALPINGO OOPHORECTOMY     TUBAL LIGATION     Patient Active Problem List   Diagnosis Date Noted   Hot flashes 08/04/2023   Hyperlipidemia 06/23/2023   History of recurrent UTIs 06/23/2023   Chronic cystitis 06/23/2023   Melanoma of skin (HCC) 06/23/2023   Osteoporosis 06/23/2022   Syncope 06/23/2022   Senile purpura (HCC) 12/19/2021   Vasomotor symptoms due to menopause 07/29/2021   Chronic cough 11/15/2020   Non-healing wound of lower extremity 11/15/2020   Synovial cyst of hand 11/15/2020   Insomnia 11/08/2018   Balance problem 11/08/2018   GERD (gastroesophageal reflux disease)  10/31/2016   Essential hypertension 10/31/2016   Avitaminosis D 10/31/2016   History of uterine prolapse 05/19/2013   History of breast cancer 11/05/2012   SUI (stress urinary incontinence, female) 03/18/2012   Pelvic relaxation due to enterocele, vaginal 12/16/2011    ONSET DATE: ~1-2 years ago  REFERRING DIAG: G62.9 (ICD-10-CM) - Polyneuropathy, unspecified   THERAPY DIAG:  Unsteadiness on feet  Generalized weakness  Abnormality of gait  Rationale for Evaluation and Treatment: Rehabilitation  SUBJECTIVE:  SUBJECTIVE STATEMENT: Pt states feeling okay so far today- States may have overdone it today in the heat.     FROM EVAL: Pt states MD believes her symptoms are a result of post-polio. Pt states when she had polio at 14 or 89y.o., she went to rehab for 4 months, and came home and was completely independent without aids/braces. Pt states she golfed for ~50years to stay active, reports she stopped playing ~3 years ago due to concern of falling on uneven/compliant surface of grass. Pt states now she walks her dog 2x/day for exercise. Pt states when she starts the walk, she is doing good, but then she quickly notices fatigue in her B LE muscles and worsening of her balance. Pt states she has recently been diagnosed with polyneuropathy.   Pt states balance and B LE lower leg weakness (below her knees) are her greatest deficits.   Reports she also does yoga and tried to perform tree pose, standing on 1 foot, which made her realize her balance deficits.   Pt states she performs the below exercises at least every other day:  Single leg stance Yoga stretches in standing and sitting  Sun Pose Childs pose Forward and lateral foot taps Stopped doing deep knee bends due to pain, and MD recommended she  discontinue this exercise Supine bicycle ab exercises Standing heel raises Seated piriformis/trunk rotation stretch Quadruped bird dogs Prone Superman  Pt states she gets on/off the floor to do her exercises and is able to do this successfully without support from a chair/counter.  Pt reports she has noticed she is weak when going to step-up onto the 1 step to enter/exit her back porch.  Pt reports increased postural sway during gait and will tend to lean towards the side her purse is on her shoulder.   Pt states she also doesn't sleep well, which impacts what she feels up to doing each day.   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 Severe sensory motor polyneuropathy confirmed by nerve conduction study. Sensory neuropathy contributes to balance issues due to impaired pressure sensation in feet, affecting her ability to make micro adjustments for balance. Lack of vibration sensation in feet noted, affecting balance.  Chronic denervation in limbs confirmed by EMG (05/2023). Polio diagnosed in the 1950s, likely contributing to current weakness and fatigue. Symptoms include leg weakness, fatigue, and balance issues. Post-polio syndrome contributes to muscle weakness, affecting her ability to prevent falls. Emphasized the need for progressive muscle loading and avoiding overexertion.   PAIN:  Are you having pain? No, but pt does states she will sometimes have back pain if she stands for too long (especially when working in her kitchen)  PRECAUTIONS: Fall  RED FLAGS: None   WEIGHT BEARING RESTRICTIONS: No  FALLS: Has patient fallen in last 6 months? No, but pt states sometimes she stumbles when getting out of the bed too  quickly to get to the bathroom  LIVING ENVIRONMENT: Lives with: lives alone Lives in: Other 1 story townhouse Stairs: enters through garage mostly, which has ramped entrance; but does have 1 step to enter/exit front and back doors of home Has following equipment at home: Single point cane, Grab bars, and built in shower seat  PLOF: Independent, but has recently hired support to help with household tasks  PATIENT GOALS: Improve balance and strength legs  OBJECTIVE:  Note: Objective measures were completed at Evaluation unless otherwise noted.  DIAGNOSTIC FINDINGS:  Per MD note on 07/14/2023 NCS/EMG lower extremity (05/12/23) Impression: This is an abnormal electrodiagnostic study consistent with 1) generalized severe sensorimotor polyneuropathy. 2) EMG changes in both lower extremities shows signs of chronic denervation.  COGNITION: Overall cognitive status: Within functional limits for tasks assessed   SENSATION: Able to sense touch on screen, but pt reports LEs feel numb in some places and had NCS/EMG nerve conduction showing decreased sensation MD note stating absent vibratory sense  COORDINATION: WFL, abnormal movement patterns due to weakness, but not cerebellar incoordination  EDEMA:  Not formally assessed, none observed  MUSCLE TONE: Not formally assessed  MUSCLE LENGTH: Not formally assessed  DTRs:  Not formally assessed  POSTURE: rounded shoulders, forward head, and posterior pelvic tilt  LOWER EXTREMITY ROM:     Active   WFL in B LEs for functional mobility tasks  Right Eval Left Eval  Hip flexion    Hip extension    Hip abduction    Hip adduction    Hip internal rotation    Hip external rotation    Knee flexion    Knee extension    Ankle dorsiflexion    Ankle plantarflexion    Ankle inversion    Ankle eversion     (Blank rows = not tested)  LOWER EXTREMITY MMT:    MMT Right Eval Left Eval  Hip flexion 3+ 3+  Hip extension    Hip  abduction    Hip adduction    Hip internal rotation    Hip external rotation    Knee flexion 4- 4  Knee extension 4 4  Ankle dorsiflexion 3+ 3+  Ankle plantarflexion 4- 4-  Ankle inversion    Ankle eversion    (Blank rows = not tested)  Manual Muscle Test Scale 0/5 = No muscle contraction can be seen or felt 1/5 = Contraction can be felt, but there is no motion 2-/5 = Part moves through incomplete ROM w/ gravity decreased 2/5 = Part moves through complete ROM w/ gravity decreased 2+/5 = Part moves through incomplete ROM (<50%) against gravity or through complete ROM w/ gravity 3-/5 = Part moves through incomplete ROM (>50%) against gravity 3/5 = Part moves through complete ROM against gravity 3+/5 = Part moves through complete ROM against gravity/slight resistance 4-/5= Holds test position against slight to moderate pressure 4/5 = Part moves through complete ROM against gravity/moderate resistance 4+/5= Holds test position against moderate to strong pressure 5/5 = Part moves through complete ROM against gravity/full resistance  BED MOBILITY:  Not tested  TRANSFERS: Sit to stand: Complete Independence and Modified independence  Assistive device utilized: None     Stand to sit: Complete Independence and Modified independence  Assistive device utilized: None     Chair to chair: Complete Independence and Modified independence  Assistive device utilized: None       RAMP:  Not tested  CURB:  Not tested  STAIRS: Findings: Level of Assistance: CGA, Stair Negotiation Technique: Alternating Pattern  with Bilateral Rails, Number of Stairs: 4, Height of Stairs: 6in   , and Comments: decreased ability to power up during ascent and decreased eccentric control on descent with pt relying on B UE support on the railings GAIT: Findings: Gait Characteristics: step through pattern, decreased step length- Right, decreased step length- Left, decreased hip/knee flexion- Right, decreased hip/knee  flexion- Left,  decreased trunk rotation, and wide BOS, Distance walked: ~142ft, Assistive device utilized:None, Level of assistance: SBA, and Comments: increased arm swing bilaterally, lack of trunk rotation, lack of full hip/knee flexion during swing with partial stiff legged gait pattern., slightly wider BOS    FUNCTIONAL TESTS:  5 times sit to stand: 9.77 seconds from green chair, no UE support 6 minute walk test: 915 ft on 7/10 with no AD 10 meter walk test:  0.86 m/s, no AD Berg Balance Scale: 45/56 on 7/10 Functional gait assessment: 16/30 on 7/10   PATIENT SURVEYS:  ABC scale: The Activities-Specific Balance Confidence (ABC) Scale 0% 10 20 30  40 50 60 70 80 90 100% No confidence<->completely confident  "How confident are you that you will not lose your balance or become unsteady when you . . .   Date tested 09/15/2023- corrected on 7/10 per pt request  Walk around the house 80%  2. Walk up or down stairs 30%  3. Bend over and pick up a slipper from in front of a closet floor 90%  4. Reach for a small can off a shelf at eye level 90%  5. Stand on tip toes and reach for something above your head 80%  6. Stand on a chair and reach for something 0%  7. Sweep the floor 90%  8. Walk outside the house to a car parked in the driveway 50%  9. Get into or out of a car 90%  10. Walk across a parking lot to the mall 90%  11. Walk up or down a ramp 100%  12. Walk in a crowded mall where people rapidly walk past you 60%  13. Are bumped into by people as you walk through the mall 50%  14. Step onto or off of an escalator while you are holding onto the railing 20%  15. Step onto or off an escalator while holding onto parcels such that you cannot hold onto the railing 0%  16. Walk outside on icy sidewalks 0%  Total: #/16 57.5%                                                                                                                                 TREATMENT DATE: 10/06/2023   Neuro  Re-ed:  -Dynamic high knee march walk in // bars - down and back x 6  -Dynamic step tap alt LE onto 6 step x 20 reps alt LE  Dynamic balance circuit on Airex pad- -staggered standing (front foot on 6 block/back foot on airex pad) then 10 reps of horizontal then vertical (initially unsteady but improves with practice)  - Static stance - feet apart x 30 sec x 2 then closed eyes x 30 sec x 2 (more unsteady)  - Narrowed stance-  - Eyes closed- pt has tendency to lean anteriorly, intermittent UE support  - Marches-no UE support x 20 reps  -Side step over 1/2 foam x 15  reps without UE support.    PATIENT EDUCATION: Education details: PT POC, initial HEP, findings of assessment Person educated: Patient Education method: Explanation and Handouts Education comprehension: verbalized understanding and needs further education  HOME EXERCISE PROGRAM:  Access Code: [email or web address] URL: [email or web address] Date: 09/29/2023 Prepared by: Connell Kiss  Exercises - Seated Ankle Dorsiflexion with Anchored Resistance  - 1 x daily - 7 x weekly - 2 sets - 10 reps - Standing March with Counter Support  - 1 x daily - 7 x weekly - 2 sets - 10 reps - Standing Hip Abduction with Resistance at Ankles and Counter Support  - 1 x daily - 3 x weekly - 2 sets - 10 reps   GOALS: Goals reviewed with patient? Yes  SHORT TERM GOALS: Target date: 10/27/2023  Patient will be independent with home exercise program to improve strength/mobility for better functional independence with ADLs and community level activities.  Baseline: initiated on 09/15/2023 Goal status: INITIAL  LONG TERM GOALS: Target date: 12/08/2023   Patient will increase Berg Balance score to > 51/56 to demonstrate improved balance and decreased fall risk during functional activities and ADLs.  Baseline: 45/56 on 7/10 Goal status: INITIAL  2.  Patient will increase six minute walk test distance to >109ft for progression to  community level ambulation, demonstrating improved gait endurance.  Baseline: 915 ft on 7/10 with no AD Goal status: INITIAL  3.  Patient will increase 10 meter walk test to >1.59m/s as to improve gait speed for better community ambulation and to reduce fall risk. Baseline: 0.86 m/s Goal status: INITIAL  4.  Patient will improve ABC scale score >80% to demonstrate increased confidence with functional mobility and ADLs. Baseline: 57.5% corrected on 7/10 Goal status: INITIAL  5.  Patient will increase Functional Gait Assessment (FGA) score to >20/30 as to reduce fall risk and improve dynamic gait safety with community ambulation.  Baseline: 16/30 on 7/10 Goal status: INITIAL   ASSESSMENT:  CLINICAL IMPRESSION:  Pt continues to perform well overall without report of any increased knee pain. She adapted well to all balance tasks- demonstrating initial unsteadiness yet progressing throughout session. She was challenged with narrowed standing, eyes closed and dynamic head turning and will benefit from further training in these areas.  Ms. Maiden will benefit from further skilled PT to improve these deficits in order to increase QOL, decrease fall risk, and ease/safety with ADLs and community activities.   OBJECTIVE IMPAIRMENTS: Abnormal gait, decreased activity tolerance, decreased balance, decreased endurance, decreased mobility, difficulty walking, decreased strength, impaired sensation, and pain.   ACTIVITY LIMITATIONS: carrying, lifting, bending, standing, squatting, stairs, transfers, bathing, dressing, reach over head, and locomotion level  PARTICIPATION LIMITATIONS: meal prep, cleaning, laundry, shopping, community activity, and yard work  PERSONAL FACTORS: Age, Fitness, Time since onset of injury/illness/exacerbation, and 3+ comorbidities: Post-Polio Syndrome, Osteoporosis, Arthritis, GERD, HTN, Insomnia, and Melanoma are also affecting patient's functional outcome.   REHAB POTENTIAL:  Good  CLINICAL DECISION MAKING: Evolving/moderate complexity  EVALUATION COMPLEXITY: Moderate  PLAN:  PT FREQUENCY: 1-2x/week   PT DURATION: 12 weeks  PLANNED INTERVENTIONS: [id number]- PT Re-evaluation, 97750- Physical Performance Testing, 97110-Therapeutic exercises, 97530- Therapeutic activity, [id number]- Neuromuscular re-education, 97535- Self Care, [id number]- Manual therapy, [id number]- Gait training, [phone]- Orthotic Initial, [phone]- Orthotic/Prosthetic subsequent, [phone]- Canalith repositioning, [phone]- Electrical stimulation (manual), Patient/Family education, Balance training, Stair training, Joint mobilization, Vestibular training, DME instructions, Cryotherapy, Moist heat, and Biofeedback  PLAN FOR NEXT SESSION:  B LE strengthening within limits of knee pain Specifically  L hip extensors and abductors Ankle DFs Hip flexors  Dynamic balance training weight shifting tasks to improve tolerance to single-leg stance - contralateral foot taps Dynamic gait training with head turns   Chyrl London, PT Physical Therapist - [facility]  Outpatient Physical Therapy- [facility] [phone]     3:36 PM 10/06/23

## 2023-10-12 ENCOUNTER — Ambulatory Visit: Attending: Family Medicine

## 2023-10-12 DIAGNOSIS — R269 Unspecified abnormalities of gait and mobility: Secondary | ICD-10-CM | POA: Insufficient documentation

## 2023-10-12 DIAGNOSIS — R2681 Unsteadiness on feet: Secondary | ICD-10-CM | POA: Diagnosis not present

## 2023-10-12 DIAGNOSIS — R531 Weakness: Secondary | ICD-10-CM | POA: Insufficient documentation

## 2023-10-12 NOTE — Therapy (Signed)
 OUTPATIENT PHYSICAL THERAPY NEURO TREATMENT   Patient Name: Brandy Wong MRN: [id number] DOB:Apr 17, 1934, 89 y.o., female Today's Date: 10/12/2023   PCP: Myrla Jon HERO, MD REFERRING PROVIDER: Maree Jannett POUR, MD   END OF SESSION:   PT End of Session - 10/12/23 1402     Visit Number 7    Number of Visits 24    Date for PT Re-Evaluation 12/08/23    Progress Note Due on Visit 10    PT Start Time 1402    PT Stop Time 1445    PT Time Calculation (min) 43 min    Equipment Utilized During Treatment Gait belt    Activity Tolerance Patient tolerated treatment well    Behavior During Therapy WFL for tasks assessed/performed              Past Medical History:  Diagnosis Date   Arthritis    Cancer (HCC) 2013   R breast, s/p radiation and lumpectomy   Cataract    R eye   Chronic cystitis    Female bladder prolapse    Frequent urination    GERD (gastroesophageal reflux disease)    Hypertension    Insomnia    Melanoma (HCC)    left upper limb   Squamous cell carcinoma in situ    Past Surgical History:  Procedure Laterality Date   APPENDECTOMY     BREAST EXCISIONAL BIOPSY Left    benign   BREAST EXCISIONAL BIOPSY Left    benign   BREAST SURGERY Right 2013   lumpectomy post positive breast ca, radiation   CATARACT EXTRACTION     LAPAROSCOPIC VAGINAL HYSTERECTOMY WITH SALPINGO OOPHORECTOMY     TUBAL LIGATION     Patient Active Problem List   Diagnosis Date Noted   Hot flashes 08/04/2023   Hyperlipidemia 06/23/2023   History of recurrent UTIs 06/23/2023   Chronic cystitis 06/23/2023   Melanoma of skin (HCC) 06/23/2023   Osteoporosis 06/23/2022   Syncope 06/23/2022   Senile purpura (HCC) 12/19/2021   Vasomotor symptoms due to menopause 07/29/2021   Chronic cough 11/15/2020   Non-healing wound of lower extremity 11/15/2020   Synovial cyst of hand 11/15/2020   Insomnia 11/08/2018   Balance problem 11/08/2018   GERD (gastroesophageal reflux disease)  10/31/2016   Essential hypertension 10/31/2016   Avitaminosis D 10/31/2016   History of uterine prolapse 05/19/2013   History of breast cancer 11/05/2012   SUI (stress urinary incontinence, female) 03/18/2012   Pelvic relaxation due to enterocele, vaginal 12/16/2011    ONSET DATE: ~1-2 years ago  REFERRING DIAG: G62.9 (ICD-10-CM) - Polyneuropathy, unspecified   THERAPY DIAG:  Unsteadiness on feet  Generalized weakness  Abnormality of gait  Rationale for Evaluation and Treatment: Rehabilitation  SUBJECTIVE:  SUBJECTIVE STATEMENT: Pt reports she had an uneventful weekend.  Pt denies any falls as well.     FROM EVAL: Pt states MD believes her symptoms are a result of post-polio. Pt states when she had polio at 14 or 89y.o., she went to rehab for 4 months, and came home and was completely independent without aids/braces. Pt states she golfed for ~50years to stay active, reports she stopped playing ~3 years ago due to concern of falling on uneven/compliant surface of grass. Pt states now she walks her dog 2x/day for exercise. Pt states when she starts the walk, she is doing good, but then she quickly notices fatigue in her B LE muscles and worsening of her balance. Pt states she has recently been diagnosed with polyneuropathy.   Pt states balance and B LE lower leg weakness (below her knees) are her greatest deficits.   Reports she also does yoga and tried to perform tree pose, standing on 1 foot, which made her realize her balance deficits.   Pt states she performs the below exercises at least every other day:  Single leg stance Yoga stretches in standing and sitting  Sun Pose Childs pose Forward and lateral foot taps Stopped doing deep knee bends due to pain, and MD recommended she discontinue  this exercise Supine bicycle ab exercises Standing heel raises Seated piriformis/trunk rotation stretch Quadruped bird dogs Prone Superman  Pt states she gets on/off the floor to do her exercises and is able to do this successfully without support from a chair/counter.  Pt reports she has noticed she is weak when going to step-up onto the 1 step to enter/exit her back porch.  Pt reports increased postural sway during gait and will tend to lean towards the side her purse is on her shoulder.   Pt states she also doesn't sleep well, which impacts what she feels up to doing each day.   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 Severe sensory motor polyneuropathy confirmed by nerve conduction study. Sensory neuropathy contributes to balance issues due to impaired pressure sensation in feet, affecting her ability to make micro adjustments for balance. Lack of vibration sensation in feet noted, affecting balance.  Chronic denervation in limbs confirmed by EMG (05/2023). Polio diagnosed in the 1950s, likely contributing to current weakness and fatigue. Symptoms include leg weakness, fatigue, and balance issues. Post-polio syndrome contributes to muscle weakness, affecting her ability to prevent falls. Emphasized the need for progressive muscle loading and avoiding overexertion.   PAIN:  Are you having pain? No, but pt does states she will sometimes have back pain if she stands for too long (especially when working in her kitchen)  PRECAUTIONS: Fall  RED FLAGS: None   WEIGHT BEARING RESTRICTIONS: No  FALLS: Has patient fallen in last 6 months? No, but pt states sometimes she stumbles when getting out of the bed too quickly to get to  the bathroom  LIVING ENVIRONMENT: Lives with: lives alone Lives in: Other 1 story townhouse Stairs: enters through garage mostly, which has ramped entrance; but does have 1 step to enter/exit front and back doors of home Has following equipment at home: Single point cane, Grab bars, and built in shower seat  PLOF: Independent, but has recently hired support to help with household tasks  PATIENT GOALS:  Improve balance and strength legs  OBJECTIVE:  Note: Objective measures were completed at Evaluation unless otherwise noted.  DIAGNOSTIC FINDINGS:  Per MD note on 07/14/2023 NCS/EMG lower extremity (05/12/23) Impression: This is an abnormal electrodiagnostic study consistent with 1) generalized severe sensorimotor polyneuropathy. 2) EMG changes in both lower extremities shows signs of chronic denervation.  COGNITION: Overall cognitive status: Within functional limits for tasks assessed   SENSATION: Able to sense touch on screen, but pt reports LEs feel numb in some places and had NCS/EMG nerve conduction showing decreased sensation MD note stating absent vibratory sense  COORDINATION: WFL, abnormal movement patterns due to weakness, but not cerebellar incoordination  EDEMA:  Not formally assessed, none observed  MUSCLE TONE: Not formally assessed  MUSCLE LENGTH: Not formally assessed  DTRs:  Not formally assessed  POSTURE: rounded shoulders, forward head, and posterior pelvic tilt  LOWER EXTREMITY ROM:     Active   WFL in B LEs for functional mobility tasks  Right Eval Left Eval  Hip flexion    Hip extension    Hip abduction    Hip adduction    Hip internal rotation    Hip external rotation    Knee flexion    Knee extension    Ankle dorsiflexion    Ankle plantarflexion    Ankle inversion    Ankle eversion     (Blank rows = not tested)  LOWER EXTREMITY MMT:    MMT Right Eval Left Eval  Hip flexion 3+ 3+  Hip extension    Hip abduction    Hip  adduction    Hip internal rotation    Hip external rotation    Knee flexion 4- 4  Knee extension 4 4  Ankle dorsiflexion 3+ 3+  Ankle plantarflexion 4- 4-  Ankle inversion    Ankle eversion    (Blank rows = not tested)  Manual Muscle Test Scale 0/5 = No muscle contraction can be seen or felt 1/5 = Contraction can be felt, but there is no motion 2-/5 = Part moves through incomplete ROM w/ gravity decreased 2/5 = Part moves through complete ROM w/ gravity decreased 2+/5 = Part moves through incomplete ROM (<50%) against gravity or through complete ROM w/ gravity 3-/5 = Part moves through incomplete ROM (>50%) against gravity 3/5 = Part moves through complete ROM against gravity 3+/5 = Part moves through complete ROM against gravity/slight resistance 4-/5= Holds test position against slight to moderate pressure 4/5 = Part moves through complete ROM against gravity/moderate resistance 4+/5= Holds test position against moderate to strong pressure 5/5 = Part moves through complete ROM against gravity/full resistance  BED MOBILITY:  Not tested  TRANSFERS: Sit to stand: Complete Independence and Modified independence  Assistive device utilized: None     Stand to sit: Complete Independence and Modified independence  Assistive device utilized: None     Chair to chair: Complete Independence and Modified independence  Assistive device utilized: None       RAMP:  Not tested  CURB:  Not tested  STAIRS: Findings: Level of Assistance: CGA, Stair Negotiation Technique: Alternating Pattern  with Bilateral Rails, Number of Stairs: 4, Height of Stairs: 6in   , and Comments: decreased ability to power up during ascent and decreased eccentric control on descent with pt relying on B UE support on the railings GAIT: Findings: Gait Characteristics: step through pattern, decreased step length- Right, decreased step length- Left, decreased hip/knee flexion- Right, decreased hip/knee flexion- Left,  decreased trunk  rotation, and wide BOS, Distance walked: ~166ft, Assistive device utilized:None, Level of assistance: SBA, and Comments: increased arm swing bilaterally, lack of trunk rotation, lack of full hip/knee flexion during swing with partial stiff legged gait pattern., slightly wider BOS    FUNCTIONAL TESTS:  5 times sit to stand: 9.77 seconds from green chair, no UE support 6 minute walk test: 915 ft on 7/10 with no AD 10 meter walk test:  0.86 m/s, no AD Berg Balance Scale: 45/56 on 7/10 Functional gait assessment: 16/30 on 7/10   PATIENT SURVEYS:  ABC scale: The Activities-Specific Balance Confidence (ABC) Scale 0% 10 20 30  40 50 60 70 80 90 100% No confidence<->completely confident  "How confident are you that you will not lose your balance or become unsteady when you . . .   Date tested 09/15/2023- corrected on 7/10 per pt request  Walk around the house 80%  2. Walk up or down stairs 30%  3. Bend over and pick up a slipper from in front of a closet floor 90%  4. Reach for a small can off a shelf at eye level 90%  5. Stand on tip toes and reach for something above your head 80%  6. Stand on a chair and reach for something 0%  7. Sweep the floor 90%  8. Walk outside the house to a car parked in the driveway 50%  9. Get into or out of a car 90%  10. Walk across a parking lot to the mall 90%  11. Walk up or down a ramp 100%  12. Walk in a crowded mall where people rapidly walk past you 60%  13. Are bumped into by people as you walk through the mall 50%  14. Step onto or off of an escalator while you are holding onto the railing 20%  15. Step onto or off an escalator while holding onto parcels such that you cannot hold onto the railing 0%  16. Walk outside on icy sidewalks 0%  Total: #/16 57.5%                                                                                                                                 TREATMENT DATE: 10/12/2023  TherEx:  Seated LAQ  with 2# AW donned, 2x10 each LE Seated marches with 2# AW donned, 2x10 each LE Seated lateral step over hedgehogs for improved hip strengthening, 2x10 each LE STS, 2x10 with verbal and visual cuing for proper form  Pt tends to have anterior lean when in standing and was encouraged to perform with upright poture and tucking buttocks in   Neuro Re-ed:  Ambulation in the long hallway with head turns, x2 laps Ambulation in the long hallway with head nods, x2 laps Retro ambulation in the hallway length of the hallway x2 Lateral steps length of hallway, x1 each direction Ambulation with eyes closed, with L lateral sway consistently, length of hallway x2  Ambulation in hallway with recall of fruits, vegetables, and states, along with subtraction from 90 by 3,  x1 lap for each category    PATIENT EDUCATION: Education details: PT POC, initial HEP, findings of assessment Person educated: Patient Education method: Explanation and Handouts Education comprehension: verbalized understanding and needs further education  HOME EXERCISE PROGRAM:  Access Code: [email or web address] URL: [email or web address] Date: 09/29/2023 Prepared by: Connell Kiss  Exercises - Seated Ankle Dorsiflexion with Anchored Resistance  - 1 x daily - 7 x weekly - 2 sets - 10 reps - Standing March with Counter Support  - 1 x daily - 7 x weekly - 2 sets - 10 reps - Standing Hip Abduction with Resistance at Ankles and Counter Support  - 1 x daily - 3 x weekly - 2 sets - 10 reps   GOALS: Goals reviewed with patient? Yes  SHORT TERM GOALS: Target date: 10/27/2023  Patient will be independent with home exercise program to improve strength/mobility for better functional independence with ADLs and community level activities.  Baseline: initiated on 09/15/2023 Goal status: INITIAL  LONG TERM GOALS: Target date: 12/08/2023   Patient will increase Berg Balance score to > 51/56 to demonstrate improved balance and decreased  fall risk during functional activities and ADLs.  Baseline: 45/56 on 7/10 Goal status: INITIAL  2.  Patient will increase six minute walk test distance to >1064ft for progression to community level ambulation, demonstrating improved gait endurance.  Baseline: 915 ft on 7/10 with no AD Goal status: INITIAL  3.  Patient will increase 10 meter walk test to >1.63m/s as to improve gait speed for better community ambulation and to reduce fall risk. Baseline: 0.86 m/s Goal status: INITIAL  4.  Patient will improve ABC scale score >80% to demonstrate increased confidence with functional mobility and ADLs. Baseline: 57.5% corrected on 7/10 Goal status: INITIAL  5.  Patient will increase Functional Gait Assessment (FGA) score to >20/30 as to reduce fall risk and improve dynamic gait safety with community ambulation.  Baseline: 16/30 on 7/10 Goal status: INITIAL   ASSESSMENT:  CLINICAL IMPRESSION:   Pt responded well to the exercises and was challenged by the math category when ambulating.  When pt was challenged, pt would have slowed gait and a more narrow BOS.  Pt was able to perform well initially, but as she progressed with her walking, she slowed and became more unsteady.   Pt will continue to benefit from skilled therapy to address remaining deficits in order to improve overall QoL and return to PLOF.     OBJECTIVE IMPAIRMENTS: Abnormal gait, decreased activity tolerance, decreased balance, decreased endurance, decreased mobility, difficulty walking, decreased strength, impaired sensation, and pain.   ACTIVITY LIMITATIONS: carrying, lifting, bending, standing, squatting, stairs, transfers, bathing, dressing, reach over head, and locomotion level  PARTICIPATION LIMITATIONS: meal prep, cleaning, laundry, shopping, community activity, and yard work  PERSONAL FACTORS: Age, Fitness, Time since onset of injury/illness/exacerbation, and 3+ comorbidities: Post-Polio Syndrome, Osteoporosis,  Arthritis, GERD, HTN, Insomnia, and Melanoma are also affecting patient's functional outcome.   REHAB POTENTIAL: Good  CLINICAL DECISION MAKING: Evolving/moderate complexity  EVALUATION COMPLEXITY: Moderate  PLAN:  PT FREQUENCY: 1-2x/week   PT DURATION: 12 weeks  PLANNED INTERVENTIONS: [id number]- PT Re-evaluation, 97750- Physical Performance Testing, 97110-Therapeutic exercises, 97530- Therapeutic activity, [id number]- Neuromuscular re-education, 97535- Self Care, [id number]- Manual therapy, [id number]- Gait training, [phone]- Orthotic Initial, [phone]- Orthotic/Prosthetic subsequent, [phone]- Canalith repositioning, [phone]- Electrical stimulation (manual), Patient/Family education, Balance training, Stair training, Joint mobilization, Vestibular training, DME  instructions, Cryotherapy, Moist heat, and Biofeedback  PLAN FOR NEXT SESSION:  B LE strengthening within limits of knee pain Specifically L hip extensors and abductors Ankle DFs Hip flexors  Dynamic balance training weight shifting tasks to improve tolerance to single-leg stance - contralateral foot taps Dynamic gait training with head turns   Fonda Simpers, PT, DPT Physical Therapist - [facility]  10/12/23, 2:02 PM

## 2023-10-15 ENCOUNTER — Ambulatory Visit: Admitting: Physical Therapy

## 2023-10-15 DIAGNOSIS — R269 Unspecified abnormalities of gait and mobility: Secondary | ICD-10-CM

## 2023-10-15 DIAGNOSIS — R2681 Unsteadiness on feet: Secondary | ICD-10-CM

## 2023-10-15 DIAGNOSIS — R531 Weakness: Secondary | ICD-10-CM

## 2023-10-15 NOTE — Therapy (Signed)
 OUTPATIENT PHYSICAL THERAPY NEURO TREATMENT   Patient Name: Brandy Wong MRN: [id number] DOB:June 14, 1934, 89 y.o., female Today's Date: 10/15/2023   PCP: Myrla Jon HERO, MD REFERRING PROVIDER: Maree Jannett POUR, MD   END OF SESSION:   PT End of Session - 10/15/23 1448     Visit Number 8    Number of Visits 24    Date for PT Re-Evaluation 12/08/23    Progress Note Due on Visit 10    PT Start Time 1448    PT Stop Time 1530    PT Time Calculation (min) 42 min    Equipment Utilized During Treatment Gait belt    Activity Tolerance Patient tolerated treatment well    Behavior During Therapy WFL for tasks assessed/performed          Past Medical History:  Diagnosis Date   Arthritis    Cancer (HCC) 2013   R breast, s/p radiation and lumpectomy   Cataract    R eye   Chronic cystitis    Female bladder prolapse    Frequent urination    GERD (gastroesophageal reflux disease)    Hypertension    Insomnia    Melanoma (HCC)    left upper limb   Squamous cell carcinoma in situ    Past Surgical History:  Procedure Laterality Date   APPENDECTOMY     BREAST EXCISIONAL BIOPSY Left    benign   BREAST EXCISIONAL BIOPSY Left    benign   BREAST SURGERY Right 2013   lumpectomy post positive breast ca, radiation   CATARACT EXTRACTION     LAPAROSCOPIC VAGINAL HYSTERECTOMY WITH SALPINGO OOPHORECTOMY     TUBAL LIGATION     Patient Active Problem List   Diagnosis Date Noted   Hot flashes 08/04/2023   Hyperlipidemia 06/23/2023   History of recurrent UTIs 06/23/2023   Chronic cystitis 06/23/2023   Melanoma of skin (HCC) 06/23/2023   Osteoporosis 06/23/2022   Syncope 06/23/2022   Senile purpura (HCC) 12/19/2021   Vasomotor symptoms due to menopause 07/29/2021   Chronic cough 11/15/2020   Non-healing wound of lower extremity 11/15/2020   Synovial cyst of hand 11/15/2020   Insomnia 11/08/2018   Balance problem 11/08/2018   GERD (gastroesophageal reflux disease) 10/31/2016    Essential hypertension 10/31/2016   Avitaminosis D 10/31/2016   History of uterine prolapse 05/19/2013   History of breast cancer 11/05/2012   SUI (stress urinary incontinence, female) 03/18/2012   Pelvic relaxation due to enterocele, vaginal 12/16/2011    ONSET DATE: ~1-2 years ago  REFERRING DIAG: G62.9 (ICD-10-CM) - Polyneuropathy, unspecified   THERAPY DIAG:  Unsteadiness on feet  Generalized weakness  Abnormality of gait  Rationale for Evaluation and Treatment: Rehabilitation  SUBJECTIVE:  SUBJECTIVE STATEMENT:  Pt states this appointment is late in the day for her because she is already pooped. Denies falls, states she is careful when she is mobilizing. States she isn't sure how much better she can expect her balance to get.   FROM EVAL: Pt states MD believes her symptoms are a result of post-polio. Pt states when she had polio at 14 or 89y.o., she went to rehab for 4 months, and came home and was completely independent without aids/braces. Pt states she golfed for ~50years to stay active, reports she stopped playing ~3 years ago due to concern of falling on uneven/compliant surface of grass. Pt states now she walks her dog 2x/day for exercise. Pt states when she starts the walk, she is doing good, but then she quickly notices fatigue in her B LE muscles and worsening of her balance. Pt states she has recently been diagnosed with polyneuropathy.   Pt states balance and B LE lower leg weakness (below her knees) are her greatest deficits.   Reports she also does yoga and tried to perform tree pose, standing on 1 foot, which made her realize her balance deficits.   Pt states she performs the below exercises at least every other day:  Single leg stance Yoga stretches in standing and sitting   Sun Pose Childs pose Forward and lateral foot taps Stopped doing deep knee bends due to pain, and MD recommended she discontinue this exercise Supine bicycle ab exercises Standing heel raises Seated piriformis/trunk rotation stretch Quadruped bird dogs Prone Superman  Pt states she gets on/off the floor to do her exercises and is able to do this successfully without support from a chair/counter.  Pt reports she has noticed she is weak when going to step-up onto the 1 step to enter/exit her back porch.  Pt reports increased postural sway during gait and will tend to lean towards the side her purse is on her shoulder.   Pt states she also doesn't sleep well, which impacts what she feels up to doing each day.   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 Severe sensory motor polyneuropathy confirmed by nerve conduction study. Sensory neuropathy contributes to balance issues due to impaired pressure sensation in feet, affecting her ability to make micro adjustments for balance. Lack of vibration sensation in feet noted, affecting balance.  Chronic denervation in limbs confirmed by EMG (05/2023). Polio diagnosed in the 1950s, likely contributing to current weakness and fatigue. Symptoms include leg weakness, fatigue, and balance issues. Post-polio syndrome contributes to muscle weakness, affecting her ability to prevent falls. Emphasized the need for progressive muscle loading and avoiding overexertion.   PAIN:  Are you having pain? No, but pt does states she will sometimes have back pain if she stands for too long (especially when working in her kitchen)  PRECAUTIONS: Fall  RED FLAGS: None   WEIGHT BEARING RESTRICTIONS:  No  FALLS: Has patient fallen in last 6 months? No, but pt states sometimes she stumbles when getting out of the bed too quickly to get to the bathroom  LIVING ENVIRONMENT: Lives with: lives alone Lives in: Other 1 story townhouse Stairs: enters through garage mostly, which has ramped entrance; but does have 1 step to enter/exit front and back doors of home Has following equipment at home: Single  point cane, Grab bars, and built in shower seat  PLOF: Independent, but has recently hired support to help with household tasks  PATIENT GOALS: Improve balance and strength legs  OBJECTIVE:  Note: Objective measures were completed at Evaluation unless otherwise noted.  DIAGNOSTIC FINDINGS:  Per MD note on 07/14/2023 NCS/EMG lower extremity (05/12/23) Impression: This is an abnormal electrodiagnostic study consistent with 1) generalized severe sensorimotor polyneuropathy. 2) EMG changes in both lower extremities shows signs of chronic denervation.  COGNITION: Overall cognitive status: Within functional limits for tasks assessed   SENSATION: Able to sense touch on screen, but pt reports LEs feel numb in some places and had NCS/EMG nerve conduction showing decreased sensation MD note stating absent vibratory sense  COORDINATION: WFL, abnormal movement patterns due to weakness, but not cerebellar incoordination  EDEMA:  Not formally assessed, none observed  MUSCLE TONE: Not formally assessed  MUSCLE LENGTH: Not formally assessed  DTRs:  Not formally assessed  POSTURE: rounded shoulders, forward head, and posterior pelvic tilt  LOWER EXTREMITY ROM:     Active   WFL in B LEs for functional mobility tasks  Right Eval Left Eval  Hip flexion    Hip extension    Hip abduction    Hip adduction    Hip internal rotation    Hip external rotation    Knee flexion    Knee extension    Ankle dorsiflexion    Ankle plantarflexion    Ankle inversion    Ankle eversion      (Blank rows = not tested)  LOWER EXTREMITY MMT:    MMT Right Eval Left Eval  Hip flexion 3+ 3+  Hip extension    Hip abduction    Hip adduction    Hip internal rotation    Hip external rotation    Knee flexion 4- 4  Knee extension 4 4  Ankle dorsiflexion 3+ 3+  Ankle plantarflexion 4- 4-  Ankle inversion    Ankle eversion    (Blank rows = not tested)  Manual Muscle Test Scale 0/5 = No muscle contraction can be seen or felt 1/5 = Contraction can be felt, but there is no motion 2-/5 = Part moves through incomplete ROM w/ gravity decreased 2/5 = Part moves through complete ROM w/ gravity decreased 2+/5 = Part moves through incomplete ROM (<50%) against gravity or through complete ROM w/ gravity 3-/5 = Part moves through incomplete ROM (>50%) against gravity 3/5 = Part moves through complete ROM against gravity 3+/5 = Part moves through complete ROM against gravity/slight resistance 4-/5= Holds test position against slight to moderate pressure 4/5 = Part moves through complete ROM against gravity/moderate resistance 4+/5= Holds test position against moderate to strong pressure 5/5 = Part moves through complete ROM against gravity/full resistance  BED MOBILITY:  Not tested  TRANSFERS: Sit to stand: Complete Independence and Modified independence  Assistive device utilized: None     Stand to sit: Complete Independence and Modified independence  Assistive device utilized: None     Chair to chair: Complete Independence and Modified independence  Assistive device utilized: None       RAMP:  Not tested  CURB:  Not tested  STAIRS: Findings: Level of Assistance: CGA, Stair Negotiation Technique: Alternating Pattern  with Bilateral Rails, Number of Stairs: 4, Height of Stairs: 6in   , and Comments: decreased ability to power up during ascent and decreased eccentric control on descent with pt relying on B UE support on the railings GAIT:  Findings: Gait Characteristics: step  through pattern, decreased step length- Right, decreased step length- Left, decreased hip/knee flexion- Right, decreased hip/knee flexion- Left, decreased trunk rotation, and wide BOS, Distance walked: ~14ft, Assistive device utilized:None, Level of assistance: SBA, and Comments: increased arm swing bilaterally, lack of trunk rotation, lack of full hip/knee flexion during swing with partial stiff legged gait pattern., slightly wider BOS    FUNCTIONAL TESTS:  5 times sit to stand: 9.77 seconds from green chair, no UE support 6 minute walk test: 915 ft on 7/10 with no AD 10 meter walk test:  0.86 m/s, no AD Berg Balance Scale: 45/56 on 7/10 Functional gait assessment: 16/30 on 7/10   PATIENT SURVEYS:  ABC scale: The Activities-Specific Balance Confidence (ABC) Scale 0% 10 20 30  40 50 60 70 80 90 100% No confidence<->completely confident  "How confident are you that you will not lose your balance or become unsteady when you . . .   Date tested 09/15/2023- corrected on 7/10 per pt request  Walk around the house 80%  2. Walk up or down stairs 30%  3. Bend over and pick up a slipper from in front of a closet floor 90%  4. Reach for a small can off a shelf at eye level 90%  5. Stand on tip toes and reach for something above your head 80%  6. Stand on a chair and reach for something 0%  7. Sweep the floor 90%  8. Walk outside the house to a car parked in the driveway 50%  9. Get into or out of a car 90%  10. Walk across a parking lot to the mall 90%  11. Walk up or down a ramp 100%  12. Walk in a crowded mall where people rapidly walk past you 60%  13. Are bumped into by people as you walk through the mall 50%  14. Step onto or off of an escalator while you are holding onto the railing 20%  15. Step onto or off an escalator while holding onto parcels such that you cannot hold onto the railing 0%  16. Walk outside on icy sidewalks 0%  Total: #/16 57.5%                                                                                                                                  TREATMENT DATE: 10/15/2023  Unless otherwise stated, CGA was provided and gait belt donned in order to ensure pt safety throughout session.  Educated patient on goal of progressing her HEP to include more balance specific exercises that can advance what she is already doing at home, based on her performance on the balance assessments.   Updated patient's HEP to include the below standing balance exercises. Performed 1 set of each:  Lateral Foot taps with Counter Support - 10 reps Educated pt on using books or other stable surface to promote picking her foot up higher rather  than just performing lateral taps to the floor Standing Romberg to 3/4 Tandem Stance - 30 seconds hold each LE Minor sway with this indicating appropriate challenge Narrow Stance with Head Nods and Counter Support - 10 reps Selected this as vertical head nods more challenging than horizontal head rotations    Dynamic stepping balance challenges including:  Lateral side stepping over 1/2 foam roll with foot taps to hedgehogs placed in 4 corners around patient Forward/backwards stepping over 1/2 foam roll with hedgehogs placed in 4 corners around patient *Requires frequent min A for balance and pt also intermittently using UE support on balance bar to maintain upright *this remains challenging for patient having a hard time taking large enough step over the 1/2 foam roll and challenged to lift foot high enough to tap target rather than kick it *educated pt not to perform these at home   Educated pt on plan to perform progress note on visit 10.   PATIENT EDUCATION: Education details: PT POC, initial HEP, findings of assessment Person educated: Patient Education method: Explanation and Handouts Education comprehension: verbalized understanding and needs further education  HOME EXERCISE PROGRAM:  Access Code: [email or web address] URL:  [email or web address] Date: 10/15/2023 Prepared by: Connell Kiss  Exercises - Seated Ankle Dorsiflexion with Anchored Resistance  - 1 x daily - 7 x weekly - 2 sets - 10 reps - Standing March with Counter Support  - 1 x daily - 7 x weekly - 2 sets - 10 reps - Standing Hip Abduction with Resistance at Ankles and Counter Support  - 1 x daily - 3 x weekly - 2 sets - 10 reps - Lateral Foot taps with Counter Support  - 1 x daily - 7 x weekly - 2 sets - 10 reps - Standing Romberg to 3/4 Tandem Stance  - 1 x daily - 7 x weekly - 2 sets - 30 seconds hold - Narrow Stance with Head Nods and Counter Support  - 1 x daily - 7 x weekly - 2 sets - 10 reps   GOALS: Goals reviewed with patient? Yes  SHORT TERM GOALS: Target date: 10/27/2023  Patient will be independent with home exercise program to improve strength/mobility for better functional independence with ADLs and community level activities.  Baseline: initiated on 09/15/2023 Goal status: INITIAL  LONG TERM GOALS: Target date: 12/08/2023   Patient will increase Berg Balance score to > 51/56 to demonstrate improved balance and decreased fall risk during functional activities and ADLs.  Baseline: 45/56 on 7/10 Goal status: INITIAL  2.  Patient will increase six minute walk test distance to >1059ft for progression to community level ambulation, demonstrating improved gait endurance.  Baseline: 915 ft on 7/10 with no AD Goal status: INITIAL  3.  Patient will increase 10 meter walk test to >1.55m/s as to improve gait speed for better community ambulation and to reduce fall risk. Baseline: 0.86 m/s Goal status: INITIAL  4.  Patient will improve ABC scale score >80% to demonstrate increased confidence with functional mobility and ADLs. Baseline: 57.5% corrected on 7/10 Goal status: INITIAL  5.  Patient will increase Functional Gait Assessment (FGA) score to >20/30 as to reduce fall risk and improve dynamic gait safety with community  ambulation.  Baseline: 16/30 on 7/10 Goal status: INITIAL   ASSESSMENT:  CLINICAL IMPRESSION:  Therapy session focused on progressing patient's HEP to include standing balance exercises that are challenging, but yet safe for her to perform independently at home. Patient demonstrates and verbalizes understanding with  therapist providing patient updated HEP. Remainder of therapy session focused on continuing to address patient's balance deficits with stepping over obstacles and lifting foot high enough to tap targets. Patient frequently relying on UE support to maintain balance with these dynamic stepping tasks and would benefit from progression to teach stepping balance recovery strategies instead. Pt will continue to benefit from skilled therapy to address remaining deficits in order to improve overall QoL and return to PLOF.     OBJECTIVE IMPAIRMENTS: Abnormal gait, decreased activity tolerance, decreased balance, decreased endurance, decreased mobility, difficulty walking, decreased strength, impaired sensation, and pain.   ACTIVITY LIMITATIONS: carrying, lifting, bending, standing, squatting, stairs, transfers, bathing, dressing, reach over head, and locomotion level  PARTICIPATION LIMITATIONS: meal prep, cleaning, laundry, shopping, community activity, and yard work  PERSONAL FACTORS: Age, Fitness, Time since onset of injury/illness/exacerbation, and 3+ comorbidities: Post-Polio Syndrome, Osteoporosis, Arthritis, GERD, HTN, Insomnia, and Melanoma are also affecting patient's functional outcome.   REHAB POTENTIAL: Good  CLINICAL DECISION MAKING: Evolving/moderate complexity  EVALUATION COMPLEXITY: Moderate  PLAN:  PT FREQUENCY: 1-2x/week   PT DURATION: 12 weeks  PLANNED INTERVENTIONS: 97164- PT Re-evaluation, 97750- Physical Performance Testing, 97110-Therapeutic exercises, 97530- Therapeutic activity, [id number]- Neuromuscular re-education, 97535- Self Care, [id number]- Manual therapy,  [id number]- Gait training, [phone]- Orthotic Initial, [phone]- Orthotic/Prosthetic subsequent, [phone]- Canalith repositioning, [phone]- Electrical stimulation (manual), Patient/Family education, Balance training, Stair training, Joint mobilization, Vestibular training, DME instructions, Cryotherapy, Moist heat, and Biofeedback  PLAN FOR NEXT SESSION:  Continue to add to patient's HEP to progress the exercises Dynamic balance training weight shifting tasks to improve tolerance to single-leg stance - contralateral foot taps Dynamic gait training with head turns Backwards walking B LE strengthening within limits of knee pain Specifically L hip extensors and abductors Ankle DFs Hip flexors     Santosha Jividen, PT, DPT, NCS, CSRS Physical Therapist - [place]  [facility]  3:33 PM 10/15/23

## 2023-10-19 ENCOUNTER — Ambulatory Visit: Admitting: Physical Therapy

## 2023-10-19 DIAGNOSIS — R2681 Unsteadiness on feet: Secondary | ICD-10-CM

## 2023-10-19 DIAGNOSIS — R531 Weakness: Secondary | ICD-10-CM

## 2023-10-19 DIAGNOSIS — R269 Unspecified abnormalities of gait and mobility: Secondary | ICD-10-CM

## 2023-10-19 NOTE — Therapy (Addendum)
 OUTPATIENT PHYSICAL THERAPY NEURO TREATMENT   Patient Name: Brandy Wong MRN: [id number] DOB:1934-06-15, 89 y.o., female Today's Date: 10/19/2023   PCP: Myrla Jon HERO, MD REFERRING PROVIDER: Maree Jannett POUR, MD   END OF SESSION:   PT End of Session - 10/19/23 1314     Visit Number 9    Number of Visits 24    Date for PT Re-Evaluation 12/08/23    Progress Note Due on Visit 10    PT Start Time 1316    PT Stop Time 1356    PT Time Calculation (min) 40 min    Equipment Utilized During Treatment Gait belt    Activity Tolerance Patient tolerated treatment well    Behavior During Therapy WFL for tasks assessed/performed          Past Medical History:  Diagnosis Date   Arthritis    Cancer (HCC) 2013   R breast, s/p radiation and lumpectomy   Cataract    R eye   Chronic cystitis    Female bladder prolapse    Frequent urination    GERD (gastroesophageal reflux disease)    Hypertension    Insomnia    Melanoma (HCC)    left upper limb   Squamous cell carcinoma in situ    Past Surgical History:  Procedure Laterality Date   APPENDECTOMY     BREAST EXCISIONAL BIOPSY Left    benign   BREAST EXCISIONAL BIOPSY Left    benign   BREAST SURGERY Right 2013   lumpectomy post positive breast ca, radiation   CATARACT EXTRACTION     LAPAROSCOPIC VAGINAL HYSTERECTOMY WITH SALPINGO OOPHORECTOMY     TUBAL LIGATION     Patient Active Problem List   Diagnosis Date Noted   Hot flashes 08/04/2023   Hyperlipidemia 06/23/2023   History of recurrent UTIs 06/23/2023   Chronic cystitis 06/23/2023   Melanoma of skin (HCC) 06/23/2023   Osteoporosis 06/23/2022   Syncope 06/23/2022   Senile purpura (HCC) 12/19/2021   Vasomotor symptoms due to menopause 07/29/2021   Chronic cough 11/15/2020   Non-healing wound of lower extremity 11/15/2020   Synovial cyst of hand 11/15/2020   Insomnia 11/08/2018   Balance problem 11/08/2018   GERD (gastroesophageal reflux disease) 10/31/2016    Essential hypertension 10/31/2016   Avitaminosis D 10/31/2016   History of uterine prolapse 05/19/2013   History of breast cancer 11/05/2012   SUI (stress urinary incontinence, female) 03/18/2012   Pelvic relaxation due to enterocele, vaginal 12/16/2011    ONSET DATE: ~1-2 years ago  REFERRING DIAG: G62.9 (ICD-10-CM) - Polyneuropathy, unspecified   THERAPY DIAG:  Unsteadiness on feet  Generalized weakness  Abnormality of gait  Rationale for Evaluation and Treatment: Rehabilitation  SUBJECTIVE:  SUBJECTIVE STATEMENT:  Pt reports that she had a productive weekend. Was able to get some house chores including bathing her dog (Yorkie) States that she had a few stumbles since last PT sessions, but was able to catch herself.  Reports that she has been intentionally taking longer steps and it has held her reduce stumbling.  No pain reported on this day.     FROM EVAL: Pt states MD believes her symptoms are a result of post-polio. Pt states when she had polio at 14 or 89y.o., she went to rehab for 4 months, and came home and was completely independent without aids/braces. Pt states she golfed for ~50years to stay active, reports she stopped playing ~3 years ago due to concern of falling on uneven/compliant surface of grass. Pt states now she walks her dog 2x/day for exercise. Pt states when she starts the walk, she is doing good, but then she quickly notices fatigue in her B LE muscles and worsening of her balance. Pt states she has recently been diagnosed with polyneuropathy.   Pt states balance and B LE lower leg weakness (below her knees) are her greatest deficits.   Reports she also does yoga and tried to perform tree pose, standing on 1 foot, which made her realize her balance deficits.   Pt states  she performs the below exercises at least every other day:  Single leg stance Yoga stretches in standing and sitting  Sun Pose Childs pose Forward and lateral foot taps Stopped doing deep knee bends due to pain, and MD recommended she discontinue this exercise Supine bicycle ab exercises Standing heel raises Seated piriformis/trunk rotation stretch Quadruped bird dogs Prone Superman  Pt states she gets on/off the floor to do her exercises and is able to do this successfully without support from a chair/counter.  Pt reports she has noticed she is weak when going to step-up onto the 1 step to enter/exit her back porch.  Pt reports increased postural sway during gait and will tend to lean towards the side her purse is on her shoulder.   Pt states she also doesn't sleep well, which impacts what she feels up to doing each day.   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 Severe sensory motor polyneuropathy confirmed by nerve conduction study. Sensory neuropathy contributes to balance issues due to impaired pressure sensation in feet, affecting her ability to make micro adjustments for balance. Lack of vibration sensation in feet noted, affecting balance.  Chronic denervation in limbs confirmed by EMG (05/2023). Polio diagnosed in the 1950s, likely contributing to current weakness and fatigue. Symptoms include leg weakness, fatigue, and balance issues. Post-polio syndrome contributes to muscle weakness, affecting her ability to prevent falls. Emphasized the need for progressive muscle loading and avoiding overexertion.   PAIN:  Are you having pain? No, but pt does states she will sometimes have back pain if she stands for too long  (especially when working in her kitchen)  PRECAUTIONS: Fall  RED FLAGS: None   WEIGHT BEARING RESTRICTIONS: No  FALLS: Has patient fallen in last 6 months? No, but pt states sometimes she stumbles when getting out of the bed too quickly to get to the bathroom  LIVING ENVIRONMENT: Lives with: lives alone Lives in: Other 1 story townhouse Stairs: enters through garage mostly,  which has ramped entrance; but does have 1 step to enter/exit front and back doors of home Has following equipment at home: Single point cane, Grab bars, and built in shower seat  PLOF: Independent, but has recently hired support to help with household tasks  PATIENT GOALS: Improve balance and strength legs  OBJECTIVE:  Note: Objective measures were completed at Evaluation unless otherwise noted.  DIAGNOSTIC FINDINGS:  Per MD note on 07/14/2023 NCS/EMG lower extremity (05/12/23) Impression: This is an abnormal electrodiagnostic study consistent with 1) generalized severe sensorimotor polyneuropathy. 2) EMG changes in both lower extremities shows signs of chronic denervation.  COGNITION: Overall cognitive status: Within functional limits for tasks assessed   SENSATION: Able to sense touch on screen, but pt reports LEs feel numb in some places and had NCS/EMG nerve conduction showing decreased sensation MD note stating absent vibratory sense  COORDINATION: WFL, abnormal movement patterns due to weakness, but not cerebellar incoordination  EDEMA:  Not formally assessed, none observed  MUSCLE TONE: Not formally assessed  MUSCLE LENGTH: Not formally assessed  DTRs:  Not formally assessed  POSTURE: rounded shoulders, forward head, and posterior pelvic tilt  LOWER EXTREMITY ROM:     Active   WFL in B LEs for functional mobility tasks  Right Eval Left Eval  Hip flexion    Hip extension    Hip abduction    Hip adduction    Hip internal rotation    Hip external rotation    Knee flexion     Knee extension    Ankle dorsiflexion    Ankle plantarflexion    Ankle inversion    Ankle eversion     (Blank rows = not tested)  LOWER EXTREMITY MMT:    MMT Right Eval Left Eval  Hip flexion 3+ 3+  Hip extension    Hip abduction    Hip adduction    Hip internal rotation    Hip external rotation    Knee flexion 4- 4  Knee extension 4 4  Ankle dorsiflexion 3+ 3+  Ankle plantarflexion 4- 4-  Ankle inversion    Ankle eversion    (Blank rows = not tested)  Manual Muscle Test Scale 0/5 = No muscle contraction can be seen or felt 1/5 = Contraction can be felt, but there is no motion 2-/5 = Part moves through incomplete ROM w/ gravity decreased 2/5 = Part moves through complete ROM w/ gravity decreased 2+/5 = Part moves through incomplete ROM (<50%) against gravity or through complete ROM w/ gravity 3-/5 = Part moves through incomplete ROM (>50%) against gravity 3/5 = Part moves through complete ROM against gravity 3+/5 = Part moves through complete ROM against gravity/slight resistance 4-/5= Holds test position against slight to moderate pressure 4/5 = Part moves through complete ROM against gravity/moderate resistance 4+/5= Holds test position against moderate to strong pressure 5/5 = Part moves through complete ROM against gravity/full resistance  BED MOBILITY:  Not tested  TRANSFERS: Sit to stand: Complete Independence and Modified independence  Assistive device utilized: None     Stand to sit: Complete Independence and Modified independence  Assistive device utilized: None     Chair to chair: Complete Independence and Modified independence  Assistive device utilized: None       RAMP:  Not tested  CURB:  Not tested  STAIRS: Findings: Level of Assistance: CGA, Stair Negotiation Technique: Alternating Pattern  with Bilateral Rails, Number of Stairs: 4, Height of Stairs: 6in   , and Comments: decreased  ability to power up during ascent and decreased eccentric control  on descent with pt relying on B UE support on the railings GAIT: Findings: Gait Characteristics: step through pattern, decreased step length- Right, decreased step length- Left, decreased hip/knee flexion- Right, decreased hip/knee flexion- Left, decreased trunk rotation, and wide BOS, Distance walked: ~132ft, Assistive device utilized:None, Level of assistance: SBA, and Comments: increased arm swing bilaterally, lack of trunk rotation, lack of full hip/knee flexion during swing with partial stiff legged gait pattern., slightly wider BOS    FUNCTIONAL TESTS:  5 times sit to stand: 9.77 seconds from green chair, no UE support 6 minute walk test: 915 ft on 7/10 with no AD 10 meter walk test:  0.86 m/s, no AD Berg Balance Scale: 45/56 on 7/10 Functional gait assessment: 16/30 on 7/10   PATIENT SURVEYS:  ABC scale: The Activities-Specific Balance Confidence (ABC) Scale 0% 10 20 30  40 50 60 70 80 90 100% No confidence<->completely confident  "How confident are you that you will not lose your balance or become unsteady when you . . .   Date tested 09/15/2023- corrected on 7/10 per pt request  Walk around the house 80%  2. Walk up or down stairs 30%  3. Bend over and pick up a slipper from in front of a closet floor 90%  4. Reach for a small can off a shelf at eye level 90%  5. Stand on tip toes and reach for something above your head 80%  6. Stand on a chair and reach for something 0%  7. Sweep the floor 90%  8. Walk outside the house to a car parked in the driveway 50%  9. Get into or out of a car 90%  10. Walk across a parking lot to the mall 90%  11. Walk up or down a ramp 100%  12. Walk in a crowded mall where people rapidly walk past you 60%  13. Are bumped into by people as you walk through the mall 50%  14. Step onto or off of an escalator while you are holding onto the railing 20%  15. Step onto or off an escalator while holding onto parcels such that you cannot hold onto the  railing 0%  16. Walk outside on icy sidewalks 0%  Total: #/16 57.5%                                                                                                                                 TREATMENT DATE: 10/19/2023  Pt ambulated in to PT gym without assistance. Noted to have mild shuffling gait pattern with ambulation into gym.   Unless otherwise stated, CGA was provided and gait belt donned in order to ensure pt safety throughout session.  Unassisted gait x 497ft with cues for increased step length. Was able to sustain elongated gait pattern throughout with only min cues from PT.   Standing at rail:  Lateral step  to 5 inch step from airex pad x 12 bil  Forward foot tap from airex pad on 5inch step x 12  Single limb foot tap on hedge hog on step x 15 bil standing on airex pad  Single limb foot tap on 2 hedge hogs from airex pad x 12 bil   Step up/down 5inch step x 15 bil tactile cue for improved spinal and parascapular activation  Noted to have improved balance and reduced LOB to the L with  Seated low row x 8 5# DB  Weighted gait with 3# AW x 179ft, then added 6# DB in the L hand x 148ft.  Weighted gait to cary purse on the L shoulder to improve postural muscle activation x 135ft.  Improved posture and reduced veer to the R with added weight in the LUE to force activation of L side parascapular muscles and intercostals on the R side to depress R shoulder.     PATIENT EDUCATION: Education details: Pt educated throughout session about proper posture and technique with exercises. Improved exercise technique, movement at target joints, use of target muscles after min to mod verbal, visual, tactile cues.  Educated to alternate purse from R to L shoulder between walks to allow improved symmetry of core activation throughout day.   Person educated: Patient Education method: Explanation and Handouts Education comprehension: verbalized understanding and needs further  education  HOME EXERCISE PROGRAM:  Access Code: [email or web address] URL: [email or web address] Date: 10/15/2023 Prepared by: Connell Kiss  Exercises - Seated Ankle Dorsiflexion with Anchored Resistance  - 1 x daily - 7 x weekly - 2 sets - 10 reps - Standing March with Counter Support  - 1 x daily - 7 x weekly - 2 sets - 10 reps - Standing Hip Abduction with Resistance at Ankles and Counter Support  - 1 x daily - 3 x weekly - 2 sets - 10 reps - Lateral Foot taps with Counter Support  - 1 x daily - 7 x weekly - 2 sets - 10 reps - Standing Romberg to 3/4 Tandem Stance  - 1 x daily - 7 x weekly - 2 sets - 30 seconds hold - Narrow Stance with Head Nods and Counter Support  - 1 x daily - 7 x weekly - 2 sets - 10 reps   GOALS: Goals reviewed with patient? Yes  SHORT TERM GOALS: Target date: 10/27/2023  Patient will be independent with home exercise program to improve strength/mobility for better functional independence with ADLs and community level activities.  Baseline: initiated on 09/15/2023 Goal status: INITIAL  LONG TERM GOALS: Target date: 12/08/2023   Patient will increase Berg Balance score to > 51/56 to demonstrate improved balance and decreased fall risk during functional activities and ADLs.  Baseline: 45/56 on 7/10 Goal status: INITIAL  2.  Patient will increase six minute walk test distance to >1031ft for progression to community level ambulation, demonstrating improved gait endurance.  Baseline: 915 ft on 7/10 with no AD Goal status: INITIAL  3.  Patient will increase 10 meter walk test to >1.5m/s as to improve gait speed for better community ambulation and to reduce fall risk. Baseline: 0.86 m/s Goal status: INITIAL  4.  Patient will improve ABC scale score >80% to demonstrate increased confidence with functional mobility and ADLs. Baseline: 57.5% corrected on 7/10 Goal status: INITIAL  5.  Patient will increase Functional Gait Assessment (FGA) score to >20/30 as  to reduce fall risk and improve dynamic gait safety with community ambulation.  Baseline: 16/30 on 7/10 Goal status: INITIAL   ASSESSMENT:  CLINICAL IMPRESSION:  PT treatment focused on improved dynamic movement and balance training. Was noted to have improved weight shift with reduced LOB to the L when intentionally activated L side paraspinals and parascapular muscles. Encouraged to alternate which shoulder she carries her purse to encourage improved symmetry of postural muscles.  Pt will continue to benefit from skilled therapy to address remaining deficits in order to improve overall QoL and return to PLOF.     OBJECTIVE IMPAIRMENTS: Abnormal gait, decreased activity tolerance, decreased balance, decreased endurance, decreased mobility, difficulty walking, decreased strength, impaired sensation, and pain.   ACTIVITY LIMITATIONS: carrying, lifting, bending, standing, squatting, stairs, transfers, bathing, dressing, reach over head, and locomotion level  PARTICIPATION LIMITATIONS: meal prep, cleaning, laundry, shopping, community activity, and yard work  PERSONAL FACTORS: Age, Fitness, Time since onset of injury/illness/exacerbation, and 3+ comorbidities: Post-Polio Syndrome, Osteoporosis, Arthritis, GERD, HTN, Insomnia, and Melanoma are also affecting patient's functional outcome.   REHAB POTENTIAL: Good  CLINICAL DECISION MAKING: Evolving/moderate complexity  EVALUATION COMPLEXITY: Moderate  PLAN:  PT FREQUENCY: 1-2x/week   PT DURATION: 12 weeks  PLANNED INTERVENTIONS: [id number]- PT Re-evaluation, 97750- Physical Performance Testing, 97110-Therapeutic exercises, 97530- Therapeutic activity, [id number]- Neuromuscular re-education, 97535- Self Care, [id number]- Manual therapy, [id number]- Gait training, [phone]- Orthotic Initial, [phone]- Orthotic/Prosthetic subsequent, [phone]- Canalith repositioning, [phone]- Electrical stimulation (manual), Patient/Family education, Balance training, Stair training, Joint  mobilization, Vestibular training, DME instructions, Cryotherapy, Moist heat, and Biofeedback  PLAN FOR NEXT SESSION:  Continue to add to patient's HEP to progress the exercises as appropriate  Dynamic balance training weight shifting tasks to improve tolerance to single-leg stance - contralateral foot taps L side parascapular activation.  Dynamic gait training with head turns Backwards walking B LE strengthening within limits of knee pain Specifically L hip extensors and abductors Ankle DFs Hip flexors    Massie Dollar PT, DPT  Physical Therapist - [facility]  [facility]  12:04 PM 10/21/23

## 2023-10-21 ENCOUNTER — Ambulatory Visit

## 2023-10-21 DIAGNOSIS — R2681 Unsteadiness on feet: Secondary | ICD-10-CM | POA: Diagnosis not present

## 2023-10-21 DIAGNOSIS — R269 Unspecified abnormalities of gait and mobility: Secondary | ICD-10-CM | POA: Diagnosis not present

## 2023-10-21 DIAGNOSIS — R531 Weakness: Secondary | ICD-10-CM | POA: Diagnosis not present

## 2023-10-21 NOTE — Therapy (Signed)
 OUTPATIENT PHYSICAL THERAPY NEURO TREATMENT/PHYSICAL THERAPY PROGRESS NOTE   Dates of reporting period  09/15/23   to   10/21/23    Patient Name: Brandy Wong MRN: [id number] DOB:[birth year], 89 y.o., female Today's Date: 10/21/2023   PCP: Myrla Jon HERO, MD REFERRING PROVIDER: Maree Jannett POUR, MD   END OF SESSION:   PT End of Session - 10/21/23 1148     Visit Number 10    Number of Visits 24    Date for PT Re-Evaluation 12/08/23    Progress Note Due on Visit 10    PT Start Time 1148    PT Stop Time 1230    PT Time Calculation (min) 42 min    Equipment Utilized During Treatment Gait belt    Activity Tolerance Patient tolerated treatment well    Behavior During Therapy WFL for tasks assessed/performed          Past Medical History:  Diagnosis Date   Arthritis    Cancer (HCC) 2013   R breast, s/p radiation and lumpectomy   Cataract    R eye   Chronic cystitis    Female bladder prolapse    Frequent urination    GERD (gastroesophageal reflux disease)    Hypertension    Insomnia    Melanoma (HCC)    left upper limb   Squamous cell carcinoma in situ    Past Surgical History:  Procedure Laterality Date   APPENDECTOMY     BREAST EXCISIONAL BIOPSY Left    benign   BREAST EXCISIONAL BIOPSY Left    benign   BREAST SURGERY Right 2013   lumpectomy post positive breast ca, radiation   CATARACT EXTRACTION     LAPAROSCOPIC VAGINAL HYSTERECTOMY WITH SALPINGO OOPHORECTOMY     TUBAL LIGATION     Patient Active Problem List   Diagnosis Date Noted   Hot flashes 08/04/2023   Hyperlipidemia 06/23/2023   History of recurrent UTIs 06/23/2023   Chronic cystitis 06/23/2023   Melanoma of skin (HCC) 06/23/2023   Osteoporosis 06/23/2022   Syncope 06/23/2022   Senile purpura (HCC) 12/19/2021   Vasomotor symptoms due to menopause 07/29/2021   Chronic cough 11/15/2020   Non-healing wound of lower extremity 11/15/2020   Synovial cyst of hand 11/15/2020   Insomnia  11/08/2018   Balance problem 11/08/2018   GERD (gastroesophageal reflux disease) 10/31/2016   Essential hypertension 10/31/2016   Avitaminosis D 10/31/2016   History of uterine prolapse 05/19/2013   History of breast cancer 11/05/2012   SUI (stress urinary incontinence, female) 03/18/2012   Pelvic relaxation due to enterocele, vaginal 12/16/2011    ONSET DATE: ~1-2 years ago  REFERRING DIAG: G62.9 (ICD-10-CM) - Polyneuropathy, unspecified   THERAPY DIAG:  Unsteadiness on feet  Generalized weakness  Abnormality of gait  Rationale for Evaluation and Treatment: Rehabilitation  SUBJECTIVE:  SUBJECTIVE STATEMENT:  Pt reports nothing new and has been watching a lot of tennis and golf.  Pt states her son and his [place]  [place] other areas for 10 days.      FROM EVAL: Pt states MD believes her symptoms are a result of post-polio. Pt states when she had polio at 14 or 89y.o., she went to rehab for 4 months, and came home and was completely independent without aids/braces. Pt states she golfed for ~50years to stay active, reports she stopped playing ~3 years ago due to concern of falling on uneven/compliant surface of grass. Pt states now she walks her dog 2x/day for exercise. Pt states when she starts the walk, she is doing good, but then she quickly notices fatigue in her B LE muscles and worsening of her balance. Pt states she has recently been diagnosed with polyneuropathy.   Pt states balance and B LE lower leg weakness (below her knees) are her greatest deficits.   Reports she also does yoga and tried to perform tree pose, standing on 1 foot, which made her realize her balance deficits.   Pt states she performs the below exercises at least every other day:  Single  leg stance Yoga stretches in standing and sitting  Sun Pose Childs pose Forward and lateral foot taps Stopped doing deep knee bends due to pain, and MD recommended she discontinue this exercise Supine bicycle ab exercises Standing heel raises Seated piriformis/trunk rotation stretch Quadruped bird dogs Prone Superman  Pt states she gets on/off the floor to do her exercises and is able to do this successfully without support from a chair/counter.  Pt reports she has noticed she is weak when going to step-up onto the 1 step to enter/exit her back porch.  Pt reports increased postural sway during gait and will tend to lean towards the side her purse is on her shoulder.   Pt states she also doesn't sleep well, which impacts what she feels up to doing each day.   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 Severe sensory motor polyneuropathy confirmed by nerve conduction study. Sensory neuropathy contributes to balance issues due to impaired pressure sensation in feet, affecting her ability to make micro adjustments for balance. Lack of vibration sensation in feet noted, affecting balance.  Chronic denervation in limbs confirmed by EMG (05/2023). Polio diagnosed in the 1950s, likely contributing to current weakness and fatigue. Symptoms include leg weakness, fatigue, and balance issues. Post-polio syndrome contributes to muscle weakness, affecting her ability to prevent falls. Emphasized the need for progressive muscle loading and avoiding overexertion.   PAIN:  Are you having pain? No, but pt does states she will sometimes have back pain if she stands for too long (especially when working in her kitchen)  PRECAUTIONS: Fall  RED  FLAGS: None   WEIGHT BEARING RESTRICTIONS: No  FALLS: Has patient fallen in last 6 months? No, but pt states sometimes she stumbles when getting out of the bed too quickly to get to the bathroom  LIVING ENVIRONMENT: Lives with: lives alone Lives in: Other 1 story townhouse Stairs: enters through garage mostly, which has ramped entrance; but does have 1 step to enter/exit front and back doors of home Has following equipment at home: Single point  cane, Grab bars, and built in shower seat  PLOF: Independent, but has recently hired support to help with household tasks  PATIENT GOALS: Improve balance and strength legs  OBJECTIVE:  Note: Objective measures were completed at Evaluation unless otherwise noted.  DIAGNOSTIC FINDINGS:  Per MD note on 07/14/2023 NCS/EMG lower extremity (05/12/23) Impression: This is an abnormal electrodiagnostic study consistent with 1) generalized severe sensorimotor polyneuropathy. 2) EMG changes in both lower extremities shows signs of chronic denervation.  COGNITION: Overall cognitive status: Within functional limits for tasks assessed   SENSATION: Able to sense touch on screen, but pt reports LEs feel numb in some places and had NCS/EMG nerve conduction showing decreased sensation MD note stating absent vibratory sense  COORDINATION: WFL, abnormal movement patterns due to weakness, but not cerebellar incoordination  EDEMA:  Not formally assessed, none observed  MUSCLE TONE: Not formally assessed  MUSCLE LENGTH: Not formally assessed  DTRs:  Not formally assessed  POSTURE: rounded shoulders, forward head, and posterior pelvic tilt  LOWER EXTREMITY ROM:     Active   WFL in B LEs for functional mobility tasks  Right Eval Left Eval  Hip flexion    Hip extension    Hip abduction    Hip adduction    Hip internal rotation    Hip external rotation    Knee flexion    Knee extension    Ankle dorsiflexion    Ankle plantarflexion     Ankle inversion    Ankle eversion     (Blank rows = not tested)  LOWER EXTREMITY MMT:    MMT Right Eval Left Eval  Hip flexion 3+ 3+  Hip extension    Hip abduction    Hip adduction    Hip internal rotation    Hip external rotation    Knee flexion 4- 4  Knee extension 4 4  Ankle dorsiflexion 3+ 3+  Ankle plantarflexion 4- 4-  Ankle inversion    Ankle eversion    (Blank rows = not tested)  Manual Muscle Test Scale 0/5 = No muscle contraction can be seen or felt 1/5 = Contraction can be felt, but there is no motion 2-/5 = Part moves through incomplete ROM w/ gravity decreased 2/5 = Part moves through complete ROM w/ gravity decreased 2+/5 = Part moves through incomplete ROM (<50%) against gravity or through complete ROM w/ gravity 3-/5 = Part moves through incomplete ROM (>50%) against gravity 3/5 = Part moves through complete ROM against gravity 3+/5 = Part moves through complete ROM against gravity/slight resistance 4-/5= Holds test position against slight to moderate pressure 4/5 = Part moves through complete ROM against gravity/moderate resistance 4+/5= Holds test position against moderate to strong pressure 5/5 = Part moves through complete ROM against gravity/full resistance  BED MOBILITY:  Not tested  TRANSFERS: Sit to stand: Complete Independence and Modified independence  Assistive device utilized: None     Stand to sit: Complete Independence and Modified independence  Assistive device utilized: None     Chair to chair: Complete Independence and Modified independence  Assistive device utilized: None       RAMP:  Not tested  CURB:  Not tested  STAIRS: Findings: Level of Assistance: CGA, Stair Negotiation Technique: Alternating Pattern  with Bilateral Rails, Number of Stairs: 4, Height of Stairs: 6in   , and Comments: decreased ability to power up during ascent and decreased eccentric control on descent with pt relying on B UE support on the  railings GAIT:  Findings: Gait Characteristics: step through pattern, decreased step length- Right, decreased step length- Left, decreased hip/knee flexion- Right, decreased hip/knee flexion- Left, decreased trunk rotation, and wide BOS, Distance walked: ~15ft, Assistive device utilized:None, Level of assistance: SBA, and Comments: increased arm swing bilaterally, lack of trunk rotation, lack of full hip/knee flexion during swing with partial stiff legged gait pattern., slightly wider BOS    FUNCTIONAL TESTS:  5 times sit to stand: 9.77 seconds from green chair, no UE support 6 minute walk test: 915 ft on 7/10 with no AD 10 meter walk test:  0.86 m/s, no AD Berg Balance Scale: 45/56 on 7/10 Functional gait assessment: 16/30 on 7/10   PATIENT SURVEYS:  ABC scale: The Activities-Specific Balance Confidence (ABC) Scale 0% 10 20 30  40 50 60 70 80 90 100% No confidence<->completely confident  "How confident are you that you will not lose your balance or become unsteady when you . . .   Date tested 09/15/2023- corrected on 7/10 per pt request 10/21/23:  Walk around the house 80% 90%  2. Walk up or down stairs 30% 20%  3. Bend over and pick up a slipper from in front of a closet floor 90% 100%  4. Reach for a small can off a shelf at eye level 90% 100%  5. Stand on tip toes and reach for something above your head 80% 50%  6. Stand on a chair and reach for something 0% 20%  7. Sweep the floor 90% 90%  8. Walk outside the house to a car parked in the driveway 50% 100%  9. Get into or out of a car 90% 100%  10. Walk across a parking lot to the mall 90% 50%  11. Walk up or down a ramp 100% 60%  12. Walk in a crowded mall where people rapidly walk past you 60% 90%  13. Are bumped into by people as you walk through the mall 50% 70%  14. Step onto or off of an escalator while you are holding onto the railing 20% 60%  15. Step onto or off an escalator while holding onto parcels such that you cannot  hold onto the railing 0% 20%  16. Walk outside on icy sidewalks 0% 10%  Total: #/16 57.5% 64.4%                                                                                                                                 TREATMENT DATE: 10/21/2023  Physical Performance Testing:  Pt ambulated in to PT gym without assistance. Noted to have mild shuffling gait pattern with ambulation into gym.   Patient demonstrates moderate increased fall risk as noted by score of  47/56 on Berg Balance Scale.  (<36= high risk for falls, close to 100%; 37-45 significant >80%; 46-51 moderate >50%; 52-55 lower >25%)  Pt participated in Functional Gait Assessment (FGA) with score of 17/30 demonstrating high fall risk (low fall  risk 25-28, medium fall risk 19-24, and high fall risk <19).   6 Min Walk Test:  Instructed patient to ambulate as quickly and as safely as possible for 6 minutes using LRAD. Patient was allowed to take standing rest breaks without stopping the test, but if the patient required a sitting rest break the clock would be stopped and the test would be over.  Results: 1074 feet without AD with supervision/CGA. Results indicate that the patient has reduced endurance with ambulation compared to age matched norms.  Age Matched Norms (in meters): 1-69 yo M: 49 F: 59, 64-79 yo M: 59 F: 471, 58-89 yo M: 417 F: 392 MDC: 58.21 meters (190.98 feet) or 50 meters (ANPTA Core Set of Outcome Measures for Adults with Neurologic Conditions, 2018)    Activities-specific Balance Confidence Scale:  Score: 64.4% Increased risk of falls in community-dwelling, older adults <80% (79.89%)  0% = no confidence - 100% = complete confidence (ANPTA Core Set of Outcome Measures for Adults with Neurologic Conditions, 2018)    PATIENT EDUCATION: Education details: Pt educated throughout session about proper posture and technique with exercises. Improved exercise technique, movement at target joints, use of  target muscles after min to mod verbal, visual, tactile cues.  Educated to alternate purse from R to L shoulder between walks to allow improved symmetry of core activation throughout day.   Person educated: Patient Education method: Explanation and Handouts Education comprehension: verbalized understanding and needs further education  HOME EXERCISE PROGRAM:  Access Code: [email or web address] URL: [email or web address] Date: 10/15/2023 Prepared by: Connell Kiss  Exercises - Seated Ankle Dorsiflexion with Anchored Resistance  - 1 x daily - 7 x weekly - 2 sets - 10 reps - Standing March with Counter Support  - 1 x daily - 7 x weekly - 2 sets - 10 reps - Standing Hip Abduction with Resistance at Ankles and Counter Support  - 1 x daily - 3 x weekly - 2 sets - 10 reps - Lateral Foot taps with Counter Support  - 1 x daily - 7 x weekly - 2 sets - 10 reps - Standing Romberg to 3/4 Tandem Stance  - 1 x daily - 7 x weekly - 2 sets - 30 seconds hold - Narrow Stance with Head Nods and Counter Support  - 1 x daily - 7 x weekly - 2 sets - 10 reps   GOALS: Goals reviewed with patient? Yes  SHORT TERM GOALS: Target date: 10/27/2023  Patient will be independent with home exercise program to improve strength/mobility for better functional independence with ADLs and community level activities.  Baseline: initiated on 09/15/2023 Goal status: INITIAL  LONG TERM GOALS: Target date: 12/08/2023   Patient will increase Berg Balance score to > 51/56 to demonstrate improved balance and decreased fall risk during functional activities and ADLs.  Baseline: 45/56 on 7/10 10/21/23: 47/56 Goal status: INITIAL  2.  Patient will increase six minute walk test distance to >1089ft for progression to community level ambulation, demonstrating improved gait endurance.  Baseline: 915 ft on 7/10 with no AD 10/21/23: 1074' with no AD Goal status: INITIAL  3.  Patient will increase 10 meter walk test to >1.66m/s as to  improve gait speed for better community ambulation and to reduce fall risk. Baseline: 0.86 m/s 10/21/23: 0.95 m/s normal speed; 1.2 m/s at fast speed Goal status: INITIAL  4.  Patient will improve ABC scale score >80% to demonstrate increased confidence with functional mobility and ADLs. Baseline: 57.5% corrected on  7/10 10/21/23: 64.4%  Goal status: INITIAL  5.  Patient will increase Functional Gait Assessment (FGA) score to >20/30 as to reduce fall risk and improve dynamic gait safety with community ambulation.  Baseline: 16/30 on 7/10 10/21/23: 17/30 Goal status: INITIAL   ASSESSMENT:  CLINICAL IMPRESSION:    Pt has made consistent progress towards goals at this time and has improved on all goals, even meeting goal set forth at evaluation.  Pt still demonstrates gait deviations during ambulation attempts and needs to continue to work on SLS in order to improve overall balance and to improve gait mechanics.  Pt likely does not have the glute strength to keep hips level which is also contributing to her unsteadiness.   Pt will continue to benefit from skilled therapy to address remaining deficits in order to improve overall QoL and return to PLOF.      OBJECTIVE IMPAIRMENTS: Abnormal gait, decreased activity tolerance, decreased balance, decreased endurance, decreased mobility, difficulty walking, decreased strength, impaired sensation, and pain.   ACTIVITY LIMITATIONS: carrying, lifting, bending, standing, squatting, stairs, transfers, bathing, dressing, reach over head, and locomotion level  PARTICIPATION LIMITATIONS: meal prep, cleaning, laundry, shopping, community activity, and yard work  PERSONAL FACTORS: Age, Fitness, Time since onset of injury/illness/exacerbation, and 3+ comorbidities: Post-Polio Syndrome, Osteoporosis, Arthritis, GERD, HTN, Insomnia, and Melanoma are also affecting patient's functional outcome.   REHAB POTENTIAL: Good  CLINICAL DECISION MAKING:  Evolving/moderate complexity  EVALUATION COMPLEXITY: Moderate  PLAN:  PT FREQUENCY: 1-2x/week   PT DURATION: 12 weeks  PLANNED INTERVENTIONS: [id number]- PT Re-evaluation, 97750- Physical Performance Testing, 97110-Therapeutic exercises, 97530- Therapeutic activity, [id number]- Neuromuscular re-education, 97535- Self Care, [id number]- Manual therapy, [id number]- Gait training, [phone]- Orthotic Initial, [phone]- Orthotic/Prosthetic subsequent, [phone]- Canalith repositioning, [phone]- Electrical stimulation (manual), Patient/Family education, Balance training, Stair training, Joint mobilization, Vestibular training, DME instructions, Cryotherapy, Moist heat, and Biofeedback  PLAN FOR NEXT SESSION:  Continue to add to patient's HEP to progress the exercises as appropriate  Dynamic balance training weight shifting tasks to improve tolerance to single-leg stance - contralateral foot taps L side parascapular activation.  Dynamic gait training with head turns Backwards walking B LE strengthening within limits of knee pain Specifically L hip extensors and abductors Ankle DFs Hip flexors    Fonda Simpers, PT, DPT Physical Therapist - [place]  [facility]  10/21/23, 1:32 PM

## 2023-10-26 ENCOUNTER — Ambulatory Visit

## 2023-10-26 DIAGNOSIS — R2681 Unsteadiness on feet: Secondary | ICD-10-CM

## 2023-10-26 DIAGNOSIS — R531 Weakness: Secondary | ICD-10-CM

## 2023-10-26 DIAGNOSIS — R269 Unspecified abnormalities of gait and mobility: Secondary | ICD-10-CM | POA: Diagnosis not present

## 2023-10-26 NOTE — Therapy (Signed)
 OUTPATIENT PHYSICAL THERAPY NEURO TREATMENT   Patient Name: Brandy Wong MRN: [id number] DOB:[birth year], 89 y.o., female Today's Date: 10/26/2023   PCP: Myrla Jon HERO, MD REFERRING PROVIDER: Maree Jannett POUR, MD   END OF SESSION:   PT End of Session - 10/26/23 1143     Visit Number 11    Number of Visits 24    Date for PT Re-Evaluation 12/08/23    Progress Note Due on Visit 10    PT Start Time 1145    PT Stop Time 1230    PT Time Calculation (min) 45 min    Equipment Utilized During Treatment Gait belt    Activity Tolerance Patient tolerated treatment well    Behavior During Therapy WFL for tasks assessed/performed          Past Medical History:  Diagnosis Date   Arthritis    Cancer (HCC) 2013   R breast, s/p radiation and lumpectomy   Cataract    R eye   Chronic cystitis    Female bladder prolapse    Frequent urination    GERD (gastroesophageal reflux disease)    Hypertension    Insomnia    Melanoma (HCC)    left upper limb   Squamous cell carcinoma in situ    Past Surgical History:  Procedure Laterality Date   APPENDECTOMY     BREAST EXCISIONAL BIOPSY Left    benign   BREAST EXCISIONAL BIOPSY Left    benign   BREAST SURGERY Right 2013   lumpectomy post positive breast ca, radiation   CATARACT EXTRACTION     LAPAROSCOPIC VAGINAL HYSTERECTOMY WITH SALPINGO OOPHORECTOMY     TUBAL LIGATION     Patient Active Problem List   Diagnosis Date Noted   Hot flashes 08/04/2023   Hyperlipidemia 06/23/2023   History of recurrent UTIs 06/23/2023   Chronic cystitis 06/23/2023   Melanoma of skin (HCC) 06/23/2023   Osteoporosis 06/23/2022   Syncope 06/23/2022   Senile purpura (HCC) 12/19/2021   Vasomotor symptoms due to menopause 07/29/2021   Chronic cough 11/15/2020   Non-healing wound of lower extremity 11/15/2020   Synovial cyst of hand 11/15/2020   Insomnia 11/08/2018   Balance problem 11/08/2018   GERD (gastroesophageal reflux disease) 10/31/2016    Essential hypertension 10/31/2016   Avitaminosis D 10/31/2016   History of uterine prolapse 05/19/2013   History of breast cancer 11/05/2012   SUI (stress urinary incontinence, female) 03/18/2012   Pelvic relaxation due to enterocele, vaginal 12/16/2011    ONSET DATE: ~1-2 years ago  REFERRING DIAG: G62.9 (ICD-10-CM) - Polyneuropathy, unspecified   THERAPY DIAG:  Unsteadiness on feet  Generalized weakness  Abnormality of gait  Rationale for Evaluation and Treatment: Rehabilitation  SUBJECTIVE:  SUBJECTIVE STATEMENT:  Pt reports over doing it last Thursday with a lot of standing/walking. Reports her LE's were really fatigued and when taking a shower that evening she slid off her shower seat gently to the floor and could not get up b/c of the slick floor resorting to crawling to the door and using the towel rack to stand. Pt has questions if maybe her strength/endurance could be related to this incident.     FROM EVAL: Pt states MD believes her symptoms are a result of post-polio. Pt states when she had polio at 14 or 89y.o., she went to rehab for 4 months, and came home and was completely independent without aids/braces. Pt states she golfed for ~50years to stay active, reports she stopped playing ~3 years ago due to concern of falling on uneven/compliant surface of grass. Pt states now she walks her dog 2x/day for exercise. Pt states when she starts the walk, she is doing good, but then she quickly notices fatigue in her B LE muscles and worsening of her balance. Pt states she has recently been diagnosed with polyneuropathy.   Pt states balance and B LE lower leg weakness (below her knees) are her greatest deficits.   Reports she also does yoga and tried to perform tree pose, standing on 1 foot,  which made her realize her balance deficits.   Pt states she performs the below exercises at least every other day:  Single leg stance Yoga stretches in standing and sitting  Sun Pose Childs pose Forward and lateral foot taps Stopped doing deep knee bends due to pain, and MD recommended she discontinue this exercise Supine bicycle ab exercises Standing heel raises Seated piriformis/trunk rotation stretch Quadruped bird dogs Prone Superman  Pt states she gets on/off the floor to do her exercises and is able to do this successfully without support from a chair/counter.  Pt reports she has noticed she is weak when going to step-up onto the 1 step to enter/exit her back porch.  Pt reports increased postural sway during gait and will tend to lean towards the side her purse is on her shoulder.   Pt states she also doesn't sleep well, which impacts what she feels up to doing each day.   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 Severe sensory motor polyneuropathy confirmed by nerve conduction study. Sensory neuropathy contributes to balance issues due to impaired pressure sensation in feet, affecting her ability to make micro adjustments for balance. Lack of vibration sensation in feet noted, affecting balance.  Chronic denervation in limbs confirmed by EMG (05/2023). Polio diagnosed in the 1950s, likely contributing to current weakness and fatigue. Symptoms include leg weakness, fatigue, and balance issues. Post-polio syndrome contributes to muscle weakness, affecting her ability to prevent falls. Emphasized the need for progressive muscle loading and avoiding overexertion.   PAIN:  Are you having pain? No, but pt does states she  will sometimes have back pain if she stands for too long (especially when working in her kitchen)  PRECAUTIONS: Fall  RED FLAGS: None   WEIGHT BEARING RESTRICTIONS: No  FALLS: Has patient fallen in last 6 months? No, but pt states sometimes she stumbles when getting out of the bed too quickly to get to the bathroom  LIVING ENVIRONMENT: Lives with: lives alone Lives  in: Other 1 story townhouse Stairs: enters through garage mostly, which has ramped entrance; but does have 1 step to enter/exit front and back doors of home Has following equipment at home: Single point cane, Grab bars, and built in shower seat  PLOF: Independent, but has recently hired support to help with household tasks  PATIENT GOALS: Improve balance and strength legs  OBJECTIVE:  Note: Objective measures were completed at Evaluation unless otherwise noted.  DIAGNOSTIC FINDINGS:  Per MD note on 07/14/2023 NCS/EMG lower extremity (05/12/23) Impression: This is an abnormal electrodiagnostic study consistent with 1) generalized severe sensorimotor polyneuropathy. 2) EMG changes in both lower extremities shows signs of chronic denervation.  COGNITION: Overall cognitive status: Within functional limits for tasks assessed   SENSATION: Able to sense touch on screen, but pt reports LEs feel numb in some places and had NCS/EMG nerve conduction showing decreased sensation MD note stating absent vibratory sense  COORDINATION: WFL, abnormal movement patterns due to weakness, but not cerebellar incoordination  EDEMA:  Not formally assessed, none observed  MUSCLE TONE: Not formally assessed  MUSCLE LENGTH: Not formally assessed  DTRs:  Not formally assessed  POSTURE: rounded shoulders, forward head, and posterior pelvic tilt  LOWER EXTREMITY ROM:     Active   WFL in B LEs for functional mobility tasks  Right Eval Left Eval  Hip flexion    Hip extension    Hip abduction    Hip adduction    Hip  internal rotation    Hip external rotation    Knee flexion    Knee extension    Ankle dorsiflexion    Ankle plantarflexion    Ankle inversion    Ankle eversion     (Blank rows = not tested)  LOWER EXTREMITY MMT:    MMT Right Eval Left Eval  Hip flexion 3+ 3+  Hip extension    Hip abduction    Hip adduction    Hip internal rotation    Hip external rotation    Knee flexion 4- 4  Knee extension 4 4  Ankle dorsiflexion 3+ 3+  Ankle plantarflexion 4- 4-  Ankle inversion    Ankle eversion    (Blank rows = not tested)  Manual Muscle Test Scale 0/5 = No muscle contraction can be seen or felt 1/5 = Contraction can be felt, but there is no motion 2-/5 = Part moves through incomplete ROM w/ gravity decreased 2/5 = Part moves through complete ROM w/ gravity decreased 2+/5 = Part moves through incomplete ROM (<50%) against gravity or through complete ROM w/ gravity 3-/5 = Part moves through incomplete ROM (>50%) against gravity 3/5 = Part moves through complete ROM against gravity 3+/5 = Part moves through complete ROM against gravity/slight resistance 4-/5= Holds test position against slight to moderate pressure 4/5 = Part moves through complete ROM against gravity/moderate resistance 4+/5= Holds test position against moderate to strong pressure 5/5 = Part moves through complete ROM against gravity/full resistance  BED MOBILITY:  Not tested  TRANSFERS: Sit to stand: Complete Independence and Modified independence  Assistive device utilized: None     Stand to sit: Complete Independence and Modified independence  Assistive device utilized: None     Chair to chair: Complete Independence and Modified independence  Assistive device utilized: None       RAMP:  Not tested  CURB:  Not tested  STAIRS: Findings: Level of Assistance: CGA, Stair Negotiation Technique: Alternating Pattern  with Bilateral Rails, Number of Stairs: 4,  Height of Stairs: 6in   , and Comments: decreased  ability to power up during ascent and decreased eccentric control on descent with pt relying on B UE support on the railings GAIT: Findings: Gait Characteristics: step through pattern, decreased step length- Right, decreased step length- Left, decreased hip/knee flexion- Right, decreased hip/knee flexion- Left, decreased trunk rotation, and wide BOS, Distance walked: ~13ft, Assistive device utilized:None, Level of assistance: SBA, and Comments: increased arm swing bilaterally, lack of trunk rotation, lack of full hip/knee flexion during swing with partial stiff legged gait pattern., slightly wider BOS    FUNCTIONAL TESTS:  5 times sit to stand: 9.77 seconds from green chair, no UE support 6 minute walk test: 915 ft on 7/10 with no AD 10 meter walk test:  0.86 m/s, no AD Berg Balance Scale: 45/56 on 7/10 Functional gait assessment: 16/30 on 7/10   PATIENT SURVEYS:  ABC scale: The Activities-Specific Balance Confidence (ABC) Scale 0% 10 20 30  40 50 60 70 80 90 100% No confidence<->completely confident  "How confident are you that you will not lose your balance or become unsteady when you . . .   Date tested 09/15/2023- corrected on 7/10 per pt request 10/21/23:  Walk around the house 80% 90%  2. Walk up or down stairs 30% 20%  3. Bend over and pick up a slipper from in front of a closet floor 90% 100%  4. Reach for a small can off a shelf at eye level 90% 100%  5. Stand on tip toes and reach for something above your head 80% 50%  6. Stand on a chair and reach for something 0% 20%  7. Sweep the floor 90% 90%  8. Walk outside the house to a car parked in the driveway 50% 100%  9. Get into or out of a car 90% 100%  10. Walk across a parking lot to the mall 90% 50%  11. Walk up or down a ramp 100% 60%  12. Walk in a crowded mall where people rapidly walk past you 60% 90%  13. Are bumped into by people as you walk through the mall 50% 70%  14. Step onto or off of an escalator while you are  holding onto the railing 20% 60%  15. Step onto or off an escalator while holding onto parcels such that you cannot hold onto the railing 0% 20%  16. Walk outside on icy sidewalks 0% 10%  Total: #/16 57.5% 64.4%                                                                                                                                 TREATMENT DATE: 10/26/2023   There.Act:   6 step ups with SUE support: 2x8/side, CGA.   Resisted gait: 3x150' with 2.5# AW's, CGA.   Discussion on how strength/endurance deficits can be related or contributing issue to her balance. Discussed varying LE exercises pt can attempt  to strengthen quads and gluts but her barrier is her B knee pain with CKC exercises.      There.Ex:  Weighted glut bridge with 5# AW at pelvis: 2x15   Standing hip abduction with 2.5# AW's: 2x10/side   PATIENT EDUCATION: Education details: Pt educated throughout session about proper posture and technique with exercises. Improved exercise technique, movement at target joints, use of target muscles after min to mod verbal, visual, tactile cues.  Educated to alternate purse from R to L shoulder between walks to allow improved symmetry of core activation throughout day.   Person educated: Patient Education method: Explanation and Handouts Education comprehension: verbalized understanding and needs further education  HOME EXERCISE PROGRAM:  Access Code: [email or web address] URL: [email or web address] Date: 10/15/2023 Prepared by: Connell Kiss  Exercises - Seated Ankle Dorsiflexion with Anchored Resistance  - 1 x daily - 7 x weekly - 2 sets - 10 reps - Standing March with Counter Support  - 1 x daily - 7 x weekly - 2 sets - 10 reps - Standing Hip Abduction with Resistance at Ankles and Counter Support  - 1 x daily - 3 x weekly - 2 sets - 10 reps - Lateral Foot taps with Counter Support  - 1 x daily - 7 x weekly - 2 sets - 10 reps - Standing Romberg to 3/4 Tandem  Stance  - 1 x daily - 7 x weekly - 2 sets - 30 seconds hold - Narrow Stance with Head Nods and Counter Support  - 1 x daily - 7 x weekly - 2 sets - 10 reps   GOALS: Goals reviewed with patient? Yes  SHORT TERM GOALS: Target date: 10/27/2023  Patient will be independent with home exercise program to improve strength/mobility for better functional independence with ADLs and community level activities.  Baseline: initiated on 09/15/2023 Goal status: INITIAL  LONG TERM GOALS: Target date: 12/08/2023   Patient will increase Berg Balance score to > 51/56 to demonstrate improved balance and decreased fall risk during functional activities and ADLs.  Baseline: 45/56 on 7/10 10/21/23: 47/56 Goal status: INITIAL  2.  Patient will increase six minute walk test distance to >1070ft for progression to community level ambulation, demonstrating improved gait endurance.  Baseline: 915 ft on 7/10 with no AD 10/21/23: 1074' with no AD Goal status: INITIAL  3.  Patient will increase 10 meter walk test to >1.31m/s as to improve gait speed for better community ambulation and to reduce fall risk. Baseline: 0.86 m/s 10/21/23: 0.95 m/s normal speed; 1.2 m/s at fast speed Goal status: INITIAL  4.  Patient will improve ABC scale score >80% to demonstrate increased confidence with functional mobility and ADLs. Baseline: 57.5% corrected on 7/10 10/21/23: 64.4%  Goal status: INITIAL  5.  Patient will increase Functional Gait Assessment (FGA) score to >20/30 as to reduce fall risk and improve dynamic gait safety with community ambulation.  Baseline: 16/30 on 7/10 10/21/23: 17/30 Goal status: INITIAL   ASSESSMENT:  CLINICAL IMPRESSION:  Varied session compared to pt's normal based off of pt's subjective report. Focused on SLS and strength exercises to pt's tolerance. Also trialing other strength exercises and next PT to f/u with tolerance to exercises. Education provided on importance of strength/endurance  exercise dosing along with balance interventions to notice significant improvement. Pt understanding. Pt will continue to benefit from skilled therapy to address remaining deficits in order to improve overall QoL and return to PLOF.        OBJECTIVE IMPAIRMENTS: Abnormal  gait, decreased activity tolerance, decreased balance, decreased endurance, decreased mobility, difficulty walking, decreased strength, impaired sensation, and pain.   ACTIVITY LIMITATIONS: carrying, lifting, bending, standing, squatting, stairs, transfers, bathing, dressing, reach over head, and locomotion level  PARTICIPATION LIMITATIONS: meal prep, cleaning, laundry, shopping, community activity, and yard work  PERSONAL FACTORS: Age, Fitness, Time since onset of injury/illness/exacerbation, and 3+ comorbidities: Post-Polio Syndrome, Osteoporosis, Arthritis, GERD, HTN, Insomnia, and Melanoma are also affecting patient's functional outcome.   REHAB POTENTIAL: Good  CLINICAL DECISION MAKING: Evolving/moderate complexity  EVALUATION COMPLEXITY: Moderate  PLAN:  PT FREQUENCY: 1-2x/week   PT DURATION: 12 weeks  PLANNED INTERVENTIONS: [id number]- PT Re-evaluation, 97750- Physical Performance Testing, 97110-Therapeutic exercises, 97530- Therapeutic activity, [id number]- Neuromuscular re-education, 97535- Self Care, [id number]- Manual therapy, [id number]- Gait training, [phone]- Orthotic Initial, [phone]- Orthotic/Prosthetic subsequent, [phone]- Canalith repositioning, [phone]- Electrical stimulation (manual), Patient/Family education, Balance training, Stair training, Joint mobilization, Vestibular training, DME instructions, Cryotherapy, Moist heat, and Biofeedback  PLAN FOR NEXT SESSION:  Continue to add to patient's HEP to progress the exercises as appropriate  Dynamic balance training weight shifting tasks to improve tolerance to single-leg stance - contralateral foot taps L side parascapular activation.  Dynamic gait training with head  turns Backwards walking B LE strengthening within limits of knee pain Specifically L hip extensors and abductors Ankle DFs Hip flexors   Dorina HERO. Fairly IV, PT, DPT Physical Therapist- [place]  [facility] 10/26/23, 12:37 PM

## 2023-10-28 ENCOUNTER — Ambulatory Visit

## 2023-10-28 DIAGNOSIS — R531 Weakness: Secondary | ICD-10-CM | POA: Diagnosis not present

## 2023-10-28 DIAGNOSIS — D225 Melanocytic nevi of trunk: Secondary | ICD-10-CM | POA: Diagnosis not present

## 2023-10-28 DIAGNOSIS — R269 Unspecified abnormalities of gait and mobility: Secondary | ICD-10-CM | POA: Diagnosis not present

## 2023-10-28 DIAGNOSIS — D485 Neoplasm of uncertain behavior of skin: Secondary | ICD-10-CM | POA: Diagnosis not present

## 2023-10-28 DIAGNOSIS — Z85820 Personal history of malignant melanoma of skin: Secondary | ICD-10-CM | POA: Diagnosis not present

## 2023-10-28 DIAGNOSIS — L570 Actinic keratosis: Secondary | ICD-10-CM | POA: Diagnosis not present

## 2023-10-28 DIAGNOSIS — D2261 Melanocytic nevi of right upper limb, including shoulder: Secondary | ICD-10-CM | POA: Diagnosis not present

## 2023-10-28 DIAGNOSIS — Z85828 Personal history of other malignant neoplasm of skin: Secondary | ICD-10-CM | POA: Diagnosis not present

## 2023-10-28 DIAGNOSIS — D2262 Melanocytic nevi of left upper limb, including shoulder: Secondary | ICD-10-CM | POA: Diagnosis not present

## 2023-10-28 DIAGNOSIS — R2681 Unsteadiness on feet: Secondary | ICD-10-CM | POA: Diagnosis not present

## 2023-10-28 DIAGNOSIS — D2272 Melanocytic nevi of left lower limb, including hip: Secondary | ICD-10-CM | POA: Diagnosis not present

## 2023-10-28 DIAGNOSIS — D044 Carcinoma in situ of skin of scalp and neck: Secondary | ICD-10-CM | POA: Diagnosis not present

## 2023-10-28 NOTE — Therapy (Signed)
 OUTPATIENT PHYSICAL THERAPY NEURO TREATMENT   Patient Name: Brandy Wong MRN: [id number] DOB:1934-05-22, 89 y.o., female Today's Date: 10/28/2023   PCP: Myrla Jon HERO, MD REFERRING PROVIDER: Maree Jannett POUR, MD   END OF SESSION:   PT End of Session - 10/28/23 1400     Visit Number 12    Number of Visits 24    Date for PT Re-Evaluation 12/08/23    Progress Note Due on Visit 10    PT Start Time 1400    PT Stop Time 1445    PT Time Calculation (min) 45 min    Equipment Utilized During Treatment Gait belt    Activity Tolerance Patient tolerated treatment well    Behavior During Therapy WFL for tasks assessed/performed          Past Medical History:  Diagnosis Date   Arthritis    Cancer (HCC) 2013   R breast, s/p radiation and lumpectomy   Cataract    R eye   Chronic cystitis    Female bladder prolapse    Frequent urination    GERD (gastroesophageal reflux disease)    Hypertension    Insomnia    Melanoma (HCC)    left upper limb   Squamous cell carcinoma in situ    Past Surgical History:  Procedure Laterality Date   APPENDECTOMY     BREAST EXCISIONAL BIOPSY Left    benign   BREAST EXCISIONAL BIOPSY Left    benign   BREAST SURGERY Right 2013   lumpectomy post positive breast ca, radiation   CATARACT EXTRACTION     LAPAROSCOPIC VAGINAL HYSTERECTOMY WITH SALPINGO OOPHORECTOMY     TUBAL LIGATION     Patient Active Problem List   Diagnosis Date Noted   Hot flashes 08/04/2023   Hyperlipidemia 06/23/2023   History of recurrent UTIs 06/23/2023   Chronic cystitis 06/23/2023   Melanoma of skin (HCC) 06/23/2023   Osteoporosis 06/23/2022   Syncope 06/23/2022   Senile purpura (HCC) 12/19/2021   Vasomotor symptoms due to menopause 07/29/2021   Chronic cough 11/15/2020   Non-healing wound of lower extremity 11/15/2020   Synovial cyst of hand 11/15/2020   Insomnia 11/08/2018   Balance problem 11/08/2018   GERD (gastroesophageal reflux disease) 10/31/2016    Essential hypertension 10/31/2016   Avitaminosis D 10/31/2016   History of uterine prolapse 05/19/2013   History of breast cancer 11/05/2012   SUI (stress urinary incontinence, female) 03/18/2012   Pelvic relaxation due to enterocele, vaginal 12/16/2011    ONSET DATE: ~1-2 years ago  REFERRING DIAG: G62.9 (ICD-10-CM) - Polyneuropathy, unspecified   THERAPY DIAG:  Unsteadiness on feet  Generalized weakness  Abnormality of gait  Rationale for Evaluation and Treatment: Rehabilitation  SUBJECTIVE:  SUBJECTIVE STATEMENT:  Pt reports no complications other than going to the dermatologist and having a spot removed.  Pt reports her dermatologist recommended her get compression stockings.    FROM EVAL: Pt states MD believes her symptoms are a result of post-polio. Pt states when she had polio at 14 or 89y.o., she went to rehab for 4 months, and came home and was completely independent without aids/braces. Pt states she golfed for ~50years to stay active, reports she stopped playing ~3 years ago due to concern of falling on uneven/compliant surface of grass. Pt states now she walks her dog 2x/day for exercise. Pt states when she starts the walk, she is doing good, but then she quickly notices fatigue in her B LE muscles and worsening of her balance. Pt states she has recently been diagnosed with polyneuropathy.   Pt states balance and B LE lower leg weakness (below her knees) are her greatest deficits.   Reports she also does yoga and tried to perform tree pose, standing on 1 foot, which made her realize her balance deficits.   Pt states she performs the below exercises at least every other day:  Single leg stance Yoga stretches in standing and sitting  Sun Pose Childs pose Forward and lateral foot  taps Stopped doing deep knee bends due to pain, and MD recommended she discontinue this exercise Supine bicycle ab exercises Standing heel raises Seated piriformis/trunk rotation stretch Quadruped bird dogs Prone Superman  Pt states she gets on/off the floor to do her exercises and is able to do this successfully without support from a chair/counter.  Pt reports she has noticed she is weak when going to step-up onto the 1 step to enter/exit her back porch.  Pt reports increased postural sway during gait and will tend to lean towards the side her purse is on her shoulder.   Pt states she also doesn't sleep well, which impacts what she feels up to doing each day.   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 Severe sensory motor polyneuropathy confirmed by nerve conduction study. Sensory neuropathy contributes to balance issues due to impaired pressure sensation in feet, affecting her ability to make micro adjustments for balance. Lack of vibration sensation in feet noted, affecting balance.  Chronic denervation in limbs confirmed by EMG (05/2023). Polio diagnosed in the 1950s, likely contributing to current weakness and fatigue. Symptoms include leg weakness, fatigue, and balance issues. Post-polio syndrome contributes to muscle weakness, affecting her ability to prevent falls. Emphasized the need for progressive muscle loading and avoiding overexertion.   PAIN:  Are you having pain? No, but pt does states she will sometimes have back pain if she stands for too long (especially when working in her kitchen)  PRECAUTIONS: Fall  RED FLAGS: None   WEIGHT BEARING RESTRICTIONS: No  FALLS: Has patient fallen in last 6 months? No, but  pt states sometimes she stumbles when getting out of the bed too quickly to get to the bathroom  LIVING ENVIRONMENT: Lives with: lives alone Lives in: Other 1 story townhouse Stairs: enters through garage mostly, which has ramped entrance; but does have 1 step to enter/exit front and back doors of home Has following equipment at home: Single point cane, Grab bars, and built in shower seat  PLOF: Independent, but has  recently hired support to help with household tasks  PATIENT GOALS: Improve balance and strength legs  OBJECTIVE:  Note: Objective measures were completed at Evaluation unless otherwise noted.  DIAGNOSTIC FINDINGS:  Per MD note on 07/14/2023 NCS/EMG lower extremity (05/12/23) Impression: This is an abnormal electrodiagnostic study consistent with 1) generalized severe sensorimotor polyneuropathy. 2) EMG changes in both lower extremities shows signs of chronic denervation.  COGNITION: Overall cognitive status: Within functional limits for tasks assessed   SENSATION: Able to sense touch on screen, but pt reports LEs feel numb in some places and had NCS/EMG nerve conduction showing decreased sensation MD note stating absent vibratory sense  COORDINATION: WFL, abnormal movement patterns due to weakness, but not cerebellar incoordination  EDEMA:  Not formally assessed, none observed  MUSCLE TONE: Not formally assessed  MUSCLE LENGTH: Not formally assessed  DTRs:  Not formally assessed  POSTURE: rounded shoulders, forward head, and posterior pelvic tilt  LOWER EXTREMITY ROM:     Active   WFL in B LEs for functional mobility tasks  Right Eval Left Eval  Hip flexion    Hip extension    Hip abduction    Hip adduction    Hip internal rotation    Hip external rotation    Knee flexion    Knee extension    Ankle dorsiflexion    Ankle plantarflexion    Ankle inversion    Ankle eversion     (Blank rows = not tested)  LOWER EXTREMITY MMT:    MMT  Right Eval Left Eval  Hip flexion 3+ 3+  Hip extension    Hip abduction    Hip adduction    Hip internal rotation    Hip external rotation    Knee flexion 4- 4  Knee extension 4 4  Ankle dorsiflexion 3+ 3+  Ankle plantarflexion 4- 4-  Ankle inversion    Ankle eversion    (Blank rows = not tested)  Manual Muscle Test Scale 0/5 = No muscle contraction can be seen or felt 1/5 = Contraction can be felt, but there is no motion 2-/5 = Part moves through incomplete ROM w/ gravity decreased 2/5 = Part moves through complete ROM w/ gravity decreased 2+/5 = Part moves through incomplete ROM (<50%) against gravity or through complete ROM w/ gravity 3-/5 = Part moves through incomplete ROM (>50%) against gravity 3/5 = Part moves through complete ROM against gravity 3+/5 = Part moves through complete ROM against gravity/slight resistance 4-/5= Holds test position against slight to moderate pressure 4/5 = Part moves through complete ROM against gravity/moderate resistance 4+/5= Holds test position against moderate to strong pressure 5/5 = Part moves through complete ROM against gravity/full resistance  BED MOBILITY:  Not tested  TRANSFERS: Sit to stand: Complete Independence and Modified independence  Assistive device utilized: None     Stand to sit: Complete Independence and Modified independence  Assistive device utilized: None     Chair to chair: Complete Independence and Modified independence  Assistive device utilized: None       RAMP:  Not tested  CURB:  Not tested  STAIRS: Findings: Level of Assistance: CGA, Stair Negotiation Technique: Alternating Pattern  with Bilateral Rails, Number of Stairs: 4, Height of Stairs: 6in   , and Comments: decreased ability to power up during ascent and decreased eccentric control on descent with pt relying on B UE support on the railings GAIT: Findings: Gait Characteristics: step through pattern, decreased step length- Right, decreased step  length-  Left, decreased hip/knee flexion- Right, decreased hip/knee flexion- Left, decreased trunk rotation, and wide BOS, Distance walked: ~150ft, Assistive device utilized:None, Level of assistance: SBA, and Comments: increased arm swing bilaterally, lack of trunk rotation, lack of full hip/knee flexion during swing with partial stiff legged gait pattern., slightly wider BOS    FUNCTIONAL TESTS:  5 times sit to stand: 9.77 seconds from green chair, no UE support 6 minute walk test: 915 ft on 7/10 with no AD 10 meter walk test:  0.86 m/s, no AD Berg Balance Scale: 45/56 on 7/10 Functional gait assessment: 16/30 on 7/10   PATIENT SURVEYS:  ABC scale: The Activities-Specific Balance Confidence (ABC) Scale 0% 10 20 30  40 50 60 70 80 90 100% No confidence<->completely confident  "How confident are you that you will not lose your balance or become unsteady when you . . .   Date tested 09/15/2023- corrected on 7/10 per pt request 10/21/23:  Walk around the house 80% 90%  2. Walk up or down stairs 30% 20%  3. Bend over and pick up a slipper from in front of a closet floor 90% 100%  4. Reach for a small can off a shelf at eye level 90% 100%  5. Stand on tip toes and reach for something above your head 80% 50%  6. Stand on a chair and reach for something 0% 20%  7. Sweep the floor 90% 90%  8. Walk outside the house to a car parked in the driveway 50% 100%  9. Get into or out of a car 90% 100%  10. Walk across a parking lot to the mall 90% 50%  11. Walk up or down a ramp 100% 60%  12. Walk in a crowded mall where people rapidly walk past you 60% 90%  13. Are bumped into by people as you walk through the mall 50% 70%  14. Step onto or off of an escalator while you are holding onto the railing 20% 60%  15. Step onto or off an escalator while holding onto parcels such that you cannot hold onto the railing 0% 20%  16. Walk outside on icy sidewalks 0% 10%  Total: #/16 57.5% 64.4%                                                                                                                                  TREATMENT DATE: 10/28/2023    TherAct:   6 stair training with BUE support on rails, focusing on eccentric lowering of the LE's, 2x10  6 step ups with no UE support: 2x4/side, CGA.   Cable column walkouts, forward/latera/backward, 7.5#, x5 each direction  STS's with verbal cues for proper form and sitting all the way on buttocks before returning to standing position, 2x10   TherEx:   Standing hip extensions with bent knee, 2x15 each LE    PATIENT EDUCATION: Education details: Pt educated throughout session about proper posture and  technique with exercises. Improved exercise technique, movement at target joints, use of target muscles after min to mod verbal, visual, tactile cues.  Educated to alternate purse from R to L shoulder between walks to allow improved symmetry of core activation throughout day.   Person educated: Patient Education method: Explanation and Handouts Education comprehension: verbalized understanding and needs further education  HOME EXERCISE PROGRAM:  Access Code: [email or web address] URL: [email or web address] Date: 10/15/2023 Prepared by: Connell Kiss  Exercises - Seated Ankle Dorsiflexion with Anchored Resistance  - 1 x daily - 7 x weekly - 2 sets - 10 reps - Standing March with Counter Support  - 1 x daily - 7 x weekly - 2 sets - 10 reps - Standing Hip Abduction with Resistance at Ankles and Counter Support  - 1 x daily - 3 x weekly - 2 sets - 10 reps - Lateral Foot taps with Counter Support  - 1 x daily - 7 x weekly - 2 sets - 10 reps - Standing Romberg to 3/4 Tandem Stance  - 1 x daily - 7 x weekly - 2 sets - 30 seconds hold - Narrow Stance with Head Nods and Counter Support  - 1 x daily - 7 x weekly - 2 sets - 10 reps   GOALS: Goals reviewed with patient? Yes  SHORT TERM GOALS: Target date: 10/27/2023  Patient will be  independent with home exercise program to improve strength/mobility for better functional independence with ADLs and community level activities.  Baseline: initiated on 09/15/2023 Goal status: INITIAL  LONG TERM GOALS: Target date: 12/08/2023   Patient will increase Berg Balance score to > 51/56 to demonstrate improved balance and decreased fall risk during functional activities and ADLs.  Baseline: 45/56 on 7/10 10/21/23: 47/56 Goal status: INITIAL  2.  Patient will increase six minute walk test distance to >1030ft for progression to community level ambulation, demonstrating improved gait endurance.  Baseline: 915 ft on 7/10 with no AD 10/21/23: 1074' with no AD Goal status: INITIAL  3.  Patient will increase 10 meter walk test to >1.64m/s as to improve gait speed for better community ambulation and to reduce fall risk. Baseline: 0.86 m/s 10/21/23: 0.95 m/s normal speed; 1.2 m/s at fast speed Goal status: INITIAL  4.  Patient will improve ABC scale score >80% to demonstrate increased confidence with functional mobility and ADLs. Baseline: 57.5% corrected on 7/10 10/21/23: 64.4%  Goal status: INITIAL  5.  Patient will increase Functional Gait Assessment (FGA) score to >20/30 as to reduce fall risk and improve dynamic gait safety with community ambulation.  Baseline: 16/30 on 7/10 10/21/23: 17/30 Goal status: INITIAL   ASSESSMENT:  CLINICAL IMPRESSION:   Pt performed well with the exercises, however had some difficulty with the resisted cable column walking at first, but was able to safely navigate without any falls.  Pt did require close CGA specifically with the resisted forward ambulation.  Pt ultimately had questions throughout the session on muscles being activated with certain exercises and received education on rationale for those exercises being implemented.   Pt will continue to benefit from skilled therapy to address remaining deficits in order to improve overall QoL and return  to PLOF.      OBJECTIVE IMPAIRMENTS: Abnormal gait, decreased activity tolerance, decreased balance, decreased endurance, decreased mobility, difficulty walking, decreased strength, impaired sensation, and pain.   ACTIVITY LIMITATIONS: carrying, lifting, bending, standing, squatting, stairs, transfers, bathing, dressing, reach over head, and locomotion level  PARTICIPATION LIMITATIONS: meal prep, cleaning,  laundry, shopping, community activity, and yard work  PERSONAL FACTORS: Age, Fitness, Time since onset of injury/illness/exacerbation, and 3+ comorbidities: Post-Polio Syndrome, Osteoporosis, Arthritis, GERD, HTN, Insomnia, and Melanoma are also affecting patient's functional outcome.   REHAB POTENTIAL: Good  CLINICAL DECISION MAKING: Evolving/moderate complexity  EVALUATION COMPLEXITY: Moderate  PLAN:  PT FREQUENCY: 1-2x/week   PT DURATION: 12 weeks  PLANNED INTERVENTIONS: [id number]- PT Re-evaluation, 97750- Physical Performance Testing, 97110-Therapeutic exercises, 97530- Therapeutic activity, [id number]- Neuromuscular re-education, 97535- Self Care, [id number]- Manual therapy, [id number]- Gait training, [phone]- Orthotic Initial, [phone]- Orthotic/Prosthetic subsequent, [phone]- Canalith repositioning, [phone]- Electrical stimulation (manual), Patient/Family education, Balance training, Stair training, Joint mobilization, Vestibular training, DME instructions, Cryotherapy, Moist heat, and Biofeedback  PLAN FOR NEXT SESSION:  Continue to add to patient's HEP to progress the exercises as appropriate  Dynamic balance training weight shifting tasks to improve tolerance to single-leg stance - contralateral foot taps L side parascapular activation.  Dynamic gait training with head turns Backwards walking B LE strengthening within limits of knee pain Specifically L hip extensors and abductors Ankle DFs Hip flexors    Fonda Simpers, PT, DPT Physical Therapist - [place]  [facility]  10/28/23, 3:52 PM

## 2023-11-02 ENCOUNTER — Ambulatory Visit: Admitting: Physical Therapy

## 2023-11-02 DIAGNOSIS — R2681 Unsteadiness on feet: Secondary | ICD-10-CM

## 2023-11-02 DIAGNOSIS — R269 Unspecified abnormalities of gait and mobility: Secondary | ICD-10-CM

## 2023-11-02 DIAGNOSIS — R531 Weakness: Secondary | ICD-10-CM

## 2023-11-02 NOTE — Therapy (Signed)
 OUTPATIENT PHYSICAL THERAPY NEURO TREATMENT   Patient Name: Brandy Wong MRN: [id number] DOB:01-17-1935, 89 y.o., female Today's Date: 11/02/2023   PCP: Myrla Jon HERO, MD REFERRING PROVIDER: Maree Jannett POUR, MD   END OF SESSION:    PT End of Session - 11/02/23 1149     Visit Number 13    Number of Visits 24    Date for PT Re-Evaluation 12/08/23    Progress Note Due on Visit 10    PT Start Time 1150    PT Stop Time 1232    PT Time Calculation (min) 42 min    Equipment Utilized During Treatment Gait belt    Activity Tolerance Patient tolerated treatment well    Behavior During Therapy WFL for tasks assessed/performed           Past Medical History:  Diagnosis Date   Arthritis    Cancer (HCC) 2013   R breast, s/p radiation and lumpectomy   Cataract    R eye   Chronic cystitis    Female bladder prolapse    Frequent urination    GERD (gastroesophageal reflux disease)    Hypertension    Insomnia    Melanoma (HCC)    left upper limb   Squamous cell carcinoma in situ    Past Surgical History:  Procedure Laterality Date   APPENDECTOMY     BREAST EXCISIONAL BIOPSY Left    benign   BREAST EXCISIONAL BIOPSY Left    benign   BREAST SURGERY Right 2013   lumpectomy post positive breast ca, radiation   CATARACT EXTRACTION     LAPAROSCOPIC VAGINAL HYSTERECTOMY WITH SALPINGO OOPHORECTOMY     TUBAL LIGATION     Patient Active Problem List   Diagnosis Date Noted   Hot flashes 08/04/2023   Hyperlipidemia 06/23/2023   History of recurrent UTIs 06/23/2023   Chronic cystitis 06/23/2023   Melanoma of skin (HCC) 06/23/2023   Osteoporosis 06/23/2022   Syncope 06/23/2022   Senile purpura (HCC) 12/19/2021   Vasomotor symptoms due to menopause 07/29/2021   Chronic cough 11/15/2020   Non-healing wound of lower extremity 11/15/2020   Synovial cyst of hand 11/15/2020   Insomnia 11/08/2018   Balance problem 11/08/2018   GERD (gastroesophageal reflux disease)  10/31/2016   Essential hypertension 10/31/2016   Avitaminosis D 10/31/2016   History of uterine prolapse 05/19/2013   History of breast cancer 11/05/2012   SUI (stress urinary incontinence, female) 03/18/2012   Pelvic relaxation due to enterocele, vaginal 12/16/2011    ONSET DATE: ~1-2 years ago  REFERRING DIAG: G62.9 (ICD-10-CM) - Polyneuropathy, unspecified   THERAPY DIAG:   Unsteadiness on feet  Abnormality of gait  Generalized weakness  Rationale for Evaluation and Treatment: Rehabilitation  SUBJECTIVE:  SUBJECTIVE STATEMENT:   Pt states that she did all of her exercises yesterday, and has been trying to do them consistently. Pt reported that she had to go to the dermatologist again to get some spots removed.   Pt reported that she feels like the neuropathy is what's causing her issues. Pt showing spot on LLE of concern. Pt going to inquire about compression stockings in October when she goes for her physical with MD.   Pt reported that she has noticed some positive strength changes, but not much of an improvement in balance. Pt inquiring about going down to 1x/week, POC moving forward.       FROM EVAL: Pt states MD believes her symptoms are a result of post-polio. Pt states when she had polio at 14 or 89y.o., she went to rehab for 4 months, and came home and was completely independent without aids/braces. Pt states she golfed for ~50years to stay active, reports she stopped playing ~3 years ago due to concern of falling on uneven/compliant surface of grass. Pt states now she walks her dog 2x/day for exercise. Pt states when she starts the walk, she is doing good, but then she quickly notices fatigue in her B LE muscles and worsening of her balance. Pt states she has recently been diagnosed with  polyneuropathy.   Pt states balance and B LE lower leg weakness (below her knees) are her greatest deficits.   Reports she also does yoga and tried to perform tree pose, standing on 1 foot, which made her realize her balance deficits.   Pt states she performs the below exercises at least every other day:  Single leg stance Yoga stretches in standing and sitting  Sun Pose Childs pose Forward and lateral foot taps Stopped doing deep knee bends due to pain, and MD recommended she discontinue this exercise Supine bicycle ab exercises Standing heel raises Seated piriformis/trunk rotation stretch Quadruped bird dogs Prone Superman  Pt states she gets on/off the floor to do her exercises and is able to do this successfully without support from a chair/counter.  Pt reports she has noticed she is weak when going to step-up onto the 1 step to enter/exit her back porch.  Pt reports increased postural sway during gait and will tend to lean towards the side her purse is on her shoulder.   Pt states she also doesn't sleep well, which impacts what she feels up to doing each day.   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 Severe sensory motor polyneuropathy confirmed by nerve conduction study. Sensory neuropathy contributes to balance issues due to impaired pressure sensation in feet, affecting her ability to make micro adjustments for balance. Lack of vibration sensation in feet noted, affecting balance.  Chronic denervation in limbs confirmed by EMG (05/2023). Polio diagnosed in the 1950s, likely contributing to current weakness and fatigue. Symptoms include leg weakness, fatigue, and balance issues. Post-polio syndrome contributes to muscle  weakness, affecting her ability to prevent falls. Emphasized the need for progressive muscle loading and avoiding overexertion.   PAIN:  Are you having pain? No, but pt does states she will sometimes have back pain if she stands for too long (especially when working in her kitchen)  PRECAUTIONS: Fall  RED FLAGS: None   WEIGHT BEARING RESTRICTIONS: No  FALLS:  Has patient fallen in last 6 months? No, but pt states sometimes she stumbles when getting out of the bed too quickly to get to the bathroom  LIVING ENVIRONMENT: Lives with: lives alone Lives in: Other 1 story townhouse Stairs: enters through garage mostly, which has ramped entrance; but does have 1 step to enter/exit front and back doors of home Has following equipment at home: Single point cane, Grab bars, and built in shower seat  PLOF: Independent, but has recently hired support to help with household tasks  PATIENT GOALS: Improve balance and strength legs  OBJECTIVE:  Note: Objective measures were completed at Evaluation unless otherwise noted.  DIAGNOSTIC FINDINGS:  Per MD note on 07/14/2023 NCS/EMG lower extremity (05/12/23) Impression: This is an abnormal electrodiagnostic study consistent with 1) generalized severe sensorimotor polyneuropathy. 2) EMG changes in both lower extremities shows signs of chronic denervation.  COGNITION: Overall cognitive status: Within functional limits for tasks assessed   SENSATION: Able to sense touch on screen, but pt reports LEs feel numb in some places and had NCS/EMG nerve conduction showing decreased sensation MD note stating absent vibratory sense  COORDINATION: WFL, abnormal movement patterns due to weakness, but not cerebellar incoordination  EDEMA:  Not formally assessed, none observed  MUSCLE TONE: Not formally assessed  MUSCLE LENGTH: Not formally assessed  DTRs:  Not formally assessed  POSTURE: rounded shoulders, forward head, and posterior pelvic  tilt  LOWER EXTREMITY ROM:     Active   WFL in B LEs for functional mobility tasks  Right Eval Left Eval  Hip flexion    Hip extension    Hip abduction    Hip adduction    Hip internal rotation    Hip external rotation    Knee flexion    Knee extension    Ankle dorsiflexion    Ankle plantarflexion    Ankle inversion    Ankle eversion     (Blank rows = not tested)  LOWER EXTREMITY MMT:    MMT Right Eval Left Eval  Hip flexion 3+ 3+  Hip extension    Hip abduction    Hip adduction    Hip internal rotation    Hip external rotation    Knee flexion 4- 4  Knee extension 4 4  Ankle dorsiflexion 3+ 3+  Ankle plantarflexion 4- 4-  Ankle inversion    Ankle eversion    (Blank rows = not tested)  Manual Muscle Test Scale 0/5 = No muscle contraction can be seen or felt 1/5 = Contraction can be felt, but there is no motion 2-/5 = Part moves through incomplete ROM w/ gravity decreased 2/5 = Part moves through complete ROM w/ gravity decreased 2+/5 = Part moves through incomplete ROM (<50%) against gravity or through complete ROM w/ gravity 3-/5 = Part moves through incomplete ROM (>50%) against gravity 3/5 = Part moves through complete ROM against gravity 3+/5 = Part moves through complete ROM against gravity/slight resistance 4-/5= Holds test position against slight to moderate pressure 4/5 = Part moves through complete ROM against gravity/moderate resistance 4+/5= Holds test position against moderate to strong pressure 5/5 = Part moves through complete ROM against gravity/full resistance  BED MOBILITY:  Not tested  TRANSFERS: Sit to stand: Complete Independence and Modified independence  Assistive device utilized: None     Stand to sit: Complete Independence and Modified independence  Assistive device utilized: None     Chair to chair: Complete Independence and Modified independence  Assistive device utilized: None  RAMP:  Not tested  CURB:  Not  tested  STAIRS: Findings: Level of Assistance: CGA, Stair Negotiation Technique: Alternating Pattern  with Bilateral Rails, Number of Stairs: 4, Height of Stairs: 6in   , and Comments: decreased ability to power up during ascent and decreased eccentric control on descent with pt relying on B UE support on the railings GAIT: Findings: Gait Characteristics: step through pattern, decreased step length- Right, decreased step length- Left, decreased hip/knee flexion- Right, decreased hip/knee flexion- Left, decreased trunk rotation, and wide BOS, Distance walked: ~135ft, Assistive device utilized:None, Level of assistance: SBA, and Comments: increased arm swing bilaterally, lack of trunk rotation, lack of full hip/knee flexion during swing with partial stiff legged gait pattern., slightly wider BOS    FUNCTIONAL TESTS:  5 times sit to stand: 9.77 seconds from green chair, no UE support 6 minute walk test: 915 ft on 7/10 with no AD 10 meter walk test:  0.86 m/s, no AD Berg Balance Scale: 45/56 on 7/10 Functional gait assessment: 16/30 on 7/10   PATIENT SURVEYS:  ABC scale: The Activities-Specific Balance Confidence (ABC) Scale 0% 10 20 30  40 50 60 70 80 90 100% No confidence<->completely confident  "How confident are you that you will not lose your balance or become unsteady when you . . .   Date tested 09/15/2023- corrected on 7/10 per pt request 10/21/23:  Walk around the house 80% 90%  2. Walk up or down stairs 30% 20%  3. Bend over and pick up a slipper from in front of a closet floor 90% 100%  4. Reach for a small can off a shelf at eye level 90% 100%  5. Stand on tip toes and reach for something above your head 80% 50%  6. Stand on a chair and reach for something 0% 20%  7. Sweep the floor 90% 90%  8. Walk outside the house to a car parked in the driveway 50% 100%  9. Get into or out of a car 90% 100%  10. Walk across a parking lot to the mall 90% 50%  11. Walk up or down a ramp 100%  60%  12. Walk in a crowded mall where people rapidly walk past you 60% 90%  13. Are bumped into by people as you walk through the mall 50% 70%  14. Step onto or off of an escalator while you are holding onto the railing 20% 60%  15. Step onto or off an escalator while holding onto parcels such that you cannot hold onto the railing 0% 20%  16. Walk outside on icy sidewalks 0% 10%  Total: #/16 57.5% 64.4%                                                                                                                                 TREATMENT DATE: 11/02/2023 Unless otherwise noted, gait belt donned & CGA provided for interventions.    Dynamic gait  with head turns, progressing to head turns with sudden turns >250' without AD, SBA-CGA Pt without significant LOB with head turns to read cards. Noted deviation in pathway throughout activity, demonstrated caution/decreased speed when turning at end of hallway.  Progressed to sudden L & R turns throughout activity Pt reported that she did not feel unsteady or unbalanced throughout activity Side stepping with 2# AWs taps to hedgehogs When side stepping to R, tap w/ RLE; when side stepping to L, tap w/ LLE; CGA-min A x2 for maintaining upright posture Pt demonstrated decreased confidence during SLS on RLE as evidenced by decreased SLS time compared to LLE Backwards walking with 2# AWs  Over even ground: 3x 15'; verbal cueing for increased step length, decreased speed for increased control during activity Of note, pt with minimal foot clearance; no LOB noted in // bars over PVC: x10, in order to promote increased foot clearance Reviewed POC to transition to 1x/week, adjust HEP accordingly.      PATIENT EDUCATION: Education details: Pt educated throughout session about proper posture and technique with exercises. Improved exercise technique, movement at target joints, use of target muscles after min to mod verbal, visual, tactile cues.  Educated to  alternate purse from R to L shoulder between walks to allow improved symmetry of core activation throughout day.   Person educated: Patient Education method: Explanation and Handouts Education comprehension: verbalized understanding and needs further education  HOME EXERCISE PROGRAM:  Access Code: [email or web address] URL: [email or web address] Date: 10/15/2023 Prepared by: Connell Kiss  Exercises - Seated Ankle Dorsiflexion with Anchored Resistance  - 1 x daily - 7 x weekly - 2 sets - 10 reps - Standing March with Counter Support  - 1 x daily - 7 x weekly - 2 sets - 10 reps - Standing Hip Abduction with Resistance at Ankles and Counter Support  - 1 x daily - 3 x weekly - 2 sets - 10 reps - Lateral Foot taps with Counter Support  - 1 x daily - 7 x weekly - 2 sets - 10 reps - Standing Romberg to 3/4 Tandem Stance  - 1 x daily - 7 x weekly - 2 sets - 30 seconds hold - Narrow Stance with Head Nods and Counter Support  - 1 x daily - 7 x weekly - 2 sets - 10 reps   GOALS: Goals reviewed with patient? Yes  SHORT TERM GOALS: Target date: 10/27/2023  Patient will be independent with home exercise program to improve strength/mobility for better functional independence with ADLs and community level activities.  Baseline: initiated on 09/15/2023 Goal status: INITIAL  LONG TERM GOALS: Target date: 12/08/2023   Patient will increase Berg Balance score to > 51/56 to demonstrate improved balance and decreased fall risk during functional activities and ADLs.  Baseline: 45/56 on 7/10 10/21/23: 47/56 Goal status: INITIAL  2.  Patient will increase six minute walk test distance to >1026ft for progression to community level ambulation, demonstrating improved gait endurance.  Baseline: 915 ft on 7/10 with no AD 10/21/23: 1074' with no AD Goal status: INITIAL  3.  Patient will increase 10 meter walk test to >1.31m/s as to improve gait speed for better community ambulation and to reduce fall  risk. Baseline: 0.86 m/s 10/21/23: 0.95 m/s normal speed; 1.2 m/s at fast speed Goal status: INITIAL  4.  Patient will improve ABC scale score >80% to demonstrate increased confidence with functional mobility and ADLs. Baseline: 57.5% corrected on 7/10 10/21/23: 64.4%  Goal status: INITIAL  5.  Patient will increase Functional Gait Assessment (FGA) score to >20/30 as to reduce fall risk and improve dynamic gait safety with community ambulation.  Baseline: 16/30 on 7/10 10/21/23: 17/30 Goal status: INITIAL   ASSESSMENT:  CLINICAL IMPRESSION:    Pt demonstrated some difficulty with dynamic gait, as evidenced by noted deviation of pathway & decreased speed with sudden turns.  Pt requiring close CGA-minA with lateral stepping & tapping hedgehogs to prevent LOB. Pt had questions this session regarding POC as related to frequency, plan for future sessions.  Pt will continue to benefit from skilled therapy to address remaining deficits in order to improve overall QoL and return to PLOF.      OBJECTIVE IMPAIRMENTS: Abnormal gait, decreased activity tolerance, decreased balance, decreased endurance, decreased mobility, difficulty walking, decreased strength, impaired sensation, and pain.   ACTIVITY LIMITATIONS: carrying, lifting, bending, standing, squatting, stairs, transfers, bathing, dressing, reach over head, and locomotion level  PARTICIPATION LIMITATIONS: meal prep, cleaning, laundry, shopping, community activity, and yard work  PERSONAL FACTORS: Age, Fitness, Time since onset of injury/illness/exacerbation, and 3+ comorbidities: Post-Polio Syndrome, Osteoporosis, Arthritis, GERD, HTN, Insomnia, and Melanoma are also affecting patient's functional outcome.   REHAB POTENTIAL: Good  CLINICAL DECISION MAKING: Evolving/moderate complexity  EVALUATION COMPLEXITY: Moderate  PLAN:  PT FREQUENCY: 1-2x/week   PT DURATION: 12 weeks  PLANNED INTERVENTIONS: [id number]- PT Re-evaluation, 97750-  Physical Performance Testing, 97110-Therapeutic exercises, 97530- Therapeutic activity, [id number]- Neuromuscular re-education, 97535- Self Care, [id number]- Manual therapy, [id number]- Gait training, [phone]- Orthotic Initial, [phone]- Orthotic/Prosthetic subsequent, [phone]- Canalith repositioning, [phone]- Electrical stimulation (manual), Patient/Family education, Balance training, Stair training, Joint mobilization, Vestibular training, DME instructions, Cryotherapy, Moist heat, and Biofeedback  PLAN FOR NEXT SESSION:   Adjust HEP to promote independence/discharge planning  Balance with eyes closed to promote upregulation of sensation secondary to neuropathy Dynamic balance training weight shifting tasks to improve tolerance to single-leg stance - foot taps L side parascapular activation.  B LE strengthening within limits of knee pain Specifically L hip extensors and abductors Ankle DFs Hip flexors    Chiquita Silvan, SPT Physical Therapy Student - [facility]  [facility]  11/02/23, 5:28 PM

## 2023-11-04 ENCOUNTER — Ambulatory Visit: Admitting: Physical Therapy

## 2023-11-04 DIAGNOSIS — R269 Unspecified abnormalities of gait and mobility: Secondary | ICD-10-CM

## 2023-11-04 DIAGNOSIS — R2681 Unsteadiness on feet: Secondary | ICD-10-CM

## 2023-11-04 DIAGNOSIS — R531 Weakness: Secondary | ICD-10-CM | POA: Diagnosis not present

## 2023-11-04 NOTE — Therapy (Signed)
 OUTPATIENT PHYSICAL THERAPY NEURO TREATMENT   Patient Name: Brandy Wong MRN: [id number] DOB:[birth year], 89 y.o., female Today's Date: 11/04/2023   PCP: Myrla Jon HERO, MD REFERRING PROVIDER: Maree Jannett POUR, MD   END OF SESSION:     PT End of Session - 11/04/23 1021     Visit Number 14    Number of Visits 24    Date for PT Re-Evaluation 12/08/23    Progress Note Due on Visit 10    PT Start Time 1022    PT Stop Time 1102    PT Time Calculation (min) 40 min    Equipment Utilized During Treatment Gait belt    Activity Tolerance Patient tolerated treatment well    Behavior During Therapy WFL for tasks assessed/performed            Past Medical History:  Diagnosis Date   Arthritis    Cancer (HCC) 2013   R breast, s/p radiation and lumpectomy   Cataract    R eye   Chronic cystitis    Female bladder prolapse    Frequent urination    GERD (gastroesophageal reflux disease)    Hypertension    Insomnia    Melanoma (HCC)    left upper limb   Squamous cell carcinoma in situ    Past Surgical History:  Procedure Laterality Date   APPENDECTOMY     BREAST EXCISIONAL BIOPSY Left    benign   BREAST EXCISIONAL BIOPSY Left    benign   BREAST SURGERY Right 2013   lumpectomy post positive breast ca, radiation   CATARACT EXTRACTION     LAPAROSCOPIC VAGINAL HYSTERECTOMY WITH SALPINGO OOPHORECTOMY     TUBAL LIGATION     Patient Active Problem List   Diagnosis Date Noted   Hot flashes 08/04/2023   Hyperlipidemia 06/23/2023   History of recurrent UTIs 06/23/2023   Chronic cystitis 06/23/2023   Melanoma of skin (HCC) 06/23/2023   Osteoporosis 06/23/2022   Syncope 06/23/2022   Senile purpura (HCC) 12/19/2021   Vasomotor symptoms due to menopause 07/29/2021   Chronic cough 11/15/2020   Non-healing wound of lower extremity 11/15/2020   Synovial cyst of hand 11/15/2020   Insomnia 11/08/2018   Balance problem 11/08/2018   GERD (gastroesophageal reflux disease)  10/31/2016   Essential hypertension 10/31/2016   Avitaminosis D 10/31/2016   History of uterine prolapse 05/19/2013   History of breast cancer 11/05/2012   SUI (stress urinary incontinence, female) 03/18/2012   Pelvic relaxation due to enterocele, vaginal 12/16/2011    ONSET DATE: ~1-2 years ago  REFERRING DIAG: G62.9 (ICD-10-CM) - Polyneuropathy, unspecified   THERAPY DIAG:    Unsteadiness on feet  Abnormality of gait  Generalized weakness  Rationale for Evaluation and Treatment: Rehabilitation  SUBJECTIVE:  SUBJECTIVE STATEMENT:    Pt stated that she has been more deliberate about completing weight shifting when walking and it has been easier on her legs. Pt stated that she would be afraid to trial walking at the park like she previously did without getting very tired.   Pt states that she felt fine after last session.   Pt reports no updates regarding medical history/medications. Pt stated that she has been completing her exercises. Pt reported that she has been wearing ankle weights during her hip adbuction and marching at the counter.   Follow up with MD around 12/09/23 for spot on LLE.      FROM EVAL: Pt states MD believes her symptoms are a result of post-polio. Pt states when she had polio at 14 or 89y.o., she went to rehab for 4 months, and came home and was completely independent without aids/braces. Pt states she golfed for ~50years to stay active, reports she stopped playing ~3 years ago due to concern of falling on uneven/compliant surface of grass. Pt states now she walks her dog 2x/day for exercise. Pt states when she starts the walk, she is doing good, but then she quickly notices fatigue in her B LE muscles and worsening of her balance. Pt states she has recently been diagnosed with  polyneuropathy.   Pt states balance and B LE lower leg weakness (below her knees) are her greatest deficits.   Reports she also does yoga and tried to perform tree pose, standing on 1 foot, which made her realize her balance deficits.   Pt states she performs the below exercises at least every other day:  Single leg stance Yoga stretches in standing and sitting  Sun Pose Childs pose Forward and lateral foot taps Stopped doing deep knee bends due to pain, and MD recommended she discontinue this exercise Supine bicycle ab exercises Standing heel raises Seated piriformis/trunk rotation stretch Quadruped bird dogs Prone Superman  Pt states she gets on/off the floor to do her exercises and is able to do this successfully without support from a chair/counter.  Pt reports she has noticed she is weak when going to step-up onto the 1 step to enter/exit her back porch.  Pt reports increased postural sway during gait and will tend to lean towards the side her purse is on her shoulder.   Pt states she also doesn't sleep well, which impacts what she feels up to doing each day.   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 Severe sensory motor polyneuropathy confirmed by nerve conduction study. Sensory neuropathy contributes to balance issues due to impaired pressure sensation in feet, affecting her ability to make micro adjustments for balance. Lack of vibration sensation in feet noted, affecting balance.  Chronic denervation in limbs confirmed by EMG (05/2023). Polio diagnosed in the 1950s, likely contributing to current weakness and fatigue. Symptoms include leg weakness, fatigue, and balance issues. Post-polio syndrome contributes to muscle  weakness, affecting her ability to prevent falls. Emphasized the need for progressive muscle loading and avoiding overexertion.   PAIN:  Are you having pain? No, but pt does states she will sometimes have back pain if she stands for too long (especially when working in her kitchen)  PRECAUTIONS: Fall  RED FLAGS: None   WEIGHT BEARING RESTRICTIONS: No  FALLS: Has  patient fallen in last 6 months? No, but pt states sometimes she stumbles when getting out of the bed too quickly to get to the bathroom  LIVING ENVIRONMENT: Lives with: lives alone Lives in: Other 1 story townhouse Stairs: enters through garage mostly, which has ramped entrance; but does have 1 step to enter/exit front and back doors of home Has following equipment at home: Single point cane, Grab bars, and built in shower seat  PLOF: Independent, but has recently hired support to help with household tasks  PATIENT GOALS: Improve balance and strength legs  OBJECTIVE:  Note: Objective measures were completed at Evaluation unless otherwise noted.  DIAGNOSTIC FINDINGS:  Per MD note on 07/14/2023 NCS/EMG lower extremity (05/12/23) Impression: This is an abnormal electrodiagnostic study consistent with 1) generalized severe sensorimotor polyneuropathy. 2) EMG changes in both lower extremities shows signs of chronic denervation.  COGNITION: Overall cognitive status: Within functional limits for tasks assessed   SENSATION: Able to sense touch on screen, but pt reports LEs feel numb in some places and had NCS/EMG nerve conduction showing decreased sensation MD note stating absent vibratory sense  COORDINATION: WFL, abnormal movement patterns due to weakness, but not cerebellar incoordination  EDEMA:  Not formally assessed, none observed  MUSCLE TONE: Not formally assessed  MUSCLE LENGTH: Not formally assessed  DTRs:  Not formally assessed  POSTURE: rounded shoulders, forward head, and posterior pelvic  tilt  LOWER EXTREMITY ROM:     Active   WFL in B LEs for functional mobility tasks  Right Eval Left Eval  Hip flexion    Hip extension    Hip abduction    Hip adduction    Hip internal rotation    Hip external rotation    Knee flexion    Knee extension    Ankle dorsiflexion    Ankle plantarflexion    Ankle inversion    Ankle eversion     (Blank rows = not tested)  LOWER EXTREMITY MMT:    MMT Right Eval Left Eval  Hip flexion 3+ 3+  Hip extension    Hip abduction    Hip adduction    Hip internal rotation    Hip external rotation    Knee flexion 4- 4  Knee extension 4 4  Ankle dorsiflexion 3+ 3+  Ankle plantarflexion 4- 4-  Ankle inversion    Ankle eversion    (Blank rows = not tested)  Manual Muscle Test Scale 0/5 = No muscle contraction can be seen or felt 1/5 = Contraction can be felt, but there is no motion 2-/5 = Part moves through incomplete ROM w/ gravity decreased 2/5 = Part moves through complete ROM w/ gravity decreased 2+/5 = Part moves through incomplete ROM (<50%) against gravity or through complete ROM w/ gravity 3-/5 = Part moves through incomplete ROM (>50%) against gravity 3/5 = Part moves through complete ROM against gravity 3+/5 = Part moves through complete ROM against gravity/slight resistance 4-/5= Holds test position against slight to moderate pressure 4/5 = Part moves through complete ROM against gravity/moderate resistance 4+/5= Holds test position against moderate to strong pressure 5/5 = Part moves through complete ROM against gravity/full resistance  BED MOBILITY:  Not tested  TRANSFERS: Sit to stand: Complete Independence and Modified independence  Assistive device utilized: None     Stand to sit: Complete Independence and Modified independence  Assistive device utilized: None     Chair to chair: Complete Independence and Modified independence  Assistive device utilized: None  RAMP:  Not tested  CURB:  Not  tested  STAIRS: Findings: Level of Assistance: CGA, Stair Negotiation Technique: Alternating Pattern  with Bilateral Rails, Number of Stairs: 4, Height of Stairs: 6in   , and Comments: decreased ability to power up during ascent and decreased eccentric control on descent with pt relying on B UE support on the railings GAIT: Findings: Gait Characteristics: step through pattern, decreased step length- Right, decreased step length- Left, decreased hip/knee flexion- Right, decreased hip/knee flexion- Left, decreased trunk rotation, and wide BOS, Distance walked: ~1110ft, Assistive device utilized:None, Level of assistance: SBA, and Comments: increased arm swing bilaterally, lack of trunk rotation, lack of full hip/knee flexion during swing with partial stiff legged gait pattern., slightly wider BOS    FUNCTIONAL TESTS:  5 times sit to stand: 9.77 seconds from green chair, no UE support 6 minute walk test: 915 ft on 7/10 with no AD 10 meter walk test:  0.86 m/s, no AD Berg Balance Scale: 45/56 on 7/10 Functional gait assessment: 16/30 on 7/10   PATIENT SURVEYS:  ABC scale: The Activities-Specific Balance Confidence (ABC) Scale 0% 10 20 30  40 50 60 70 80 90 100% No confidence<->completely confident  "How confident are you that you will not lose your balance or become unsteady when you . . .   Date tested 09/15/2023- corrected on 7/10 per pt request 10/21/23:  Walk around the house 80% 90%  2. Walk up or down stairs 30% 20%  3. Bend over and pick up a slipper from in front of a closet floor 90% 100%  4. Reach for a small can off a shelf at eye level 90% 100%  5. Stand on tip toes and reach for something above your head 80% 50%  6. Stand on a chair and reach for something 0% 20%  7. Sweep the floor 90% 90%  8. Walk outside the house to a car parked in the driveway 50% 100%  9. Get into or out of a car 90% 100%  10. Walk across a parking lot to the mall 90% 50%  11. Walk up or down a ramp 100%  60%  12. Walk in a crowded mall where people rapidly walk past you 60% 90%  13. Are bumped into by people as you walk through the mall 50% 70%  14. Step onto or off of an escalator while you are holding onto the railing 20% 60%  15. Step onto or off an escalator while holding onto parcels such that you cannot hold onto the railing 0% 20%  16. Walk outside on icy sidewalks 0% 10%  Total: #/16 57.5% 64.4%                                                                                                                                 TREATMENT DATE: 11/04/2023 Unless otherwise noted, gait belt donned & CGA provided for interventions.   Discussed HEP &  adherence. Pt completed following exercises from HEP to assess efficacy & technique, as well as determine if progression was appropriate at this time:  Seated Ankle Dorsiflexion with GTB 2x15 Pt reporting feeling this exercise in the shin Standing March with Counter Support with 2.5# AW, 2x10  Pt continuing to require BUE support Standing Hip Abduction with Counter Support with 2.5# AW, 2x10  Pt continuing to require BUE support Standing Romberg to 3/4 Tandem Stance x1 min. Each LE leading Pt continuing to demonstrate lateral sway with activity, increased sway with L foot leading Trialed tandem stance with each LE leading; pt demonstrated increased sway with activity. Pt educated to continue 3/4 tandem stance for HEP. Feet close together with head nods 10x with hands hovering  Pt able to complete without demostrating instability Trialed 10x horizontal head turns; pt also able to complete without demonstrating instability Pt educated to d/c activity as part of HEP d/t improvements noted with activity   Pt completing following activity to promote SLS & functional strengthening with 2.5 AWs donned:  Stepping LE onto first step, bringing other LE to tap second step 2x10 each LE Trialed with unilat UE support; pt requiring verbal cueing to complete  step through pattern to second step  Pt demonstrating instability with SLS and unilateral support; regressed to activity with BUE support Pt able to complete step through pattern more consistently with less instability with BUE support  Pt educated on adding STSs to HEP: adding blanket, towel, or pillow to increase seat height in order to decrease difficulty. Pt educated to trial activity~2x/week, discontinue activity if exacerbating knee pain.   Discussed POC; adjusted to PT session 1x/week, continue with HEP.       PATIENT EDUCATION: Education details: Pt educated throughout session about proper posture and technique with exercises. Improved exercise technique, movement at target joints, use of target muscles after min to mod verbal, visual, tactile cues.   Person educated: Patient Education method: Explanation and Handouts Education comprehension: verbalized understanding and needs further education  HOME EXERCISE PROGRAM: Access Code: [email or web address] URL: [email or web address] Date: 11/04/2023 Prepared by: Chiquita Silvan  Exercises - Seated Ankle Dorsiflexion with Anchored Resistance  - 1 x daily - 7 x weekly - 2 sets - 10 reps - Standing March with Counter Support  - 1 x daily - 7 x weekly - 2 sets - 10 reps - Standing Hip Abduction with Resistance at Ankles and Counter Support  - 1 x daily - 3 x weekly - 2 sets - 10 reps - Lateral Foot taps with Counter Support  - 1 x daily - 7 x weekly - 2 sets - 10 reps - Standing Romberg to 3/4 Tandem Stance  - 1 x daily - 7 x weekly - 2 sets - 30 seconds hold - Sit to Stand with Arms Crossed  - 2 x weekly - 2 sets - 10 reps   GOALS: Goals reviewed with patient? Yes  SHORT TERM GOALS: Target date: 10/27/2023  Patient will be independent with home exercise program to improve strength/mobility for better functional independence with ADLs and community level activities.  Baseline: initiated on 09/15/2023 Goal status: INITIAL  LONG  TERM GOALS: Target date: 12/08/2023   Patient will increase Berg Balance score to > 51/56 to demonstrate improved balance and decreased fall risk during functional activities and ADLs.  Baseline: 45/56 on 7/10 10/21/23: 47/56 Goal status: INITIAL  2.  Patient will increase six minute walk test distance to >1080ft for progression to community level  ambulation, demonstrating improved gait endurance.  Baseline: 915 ft on 7/10 with no AD 10/21/23: 1074' with no AD Goal status: INITIAL  3.  Patient will increase 10 meter walk test to >1.18m/s as to improve gait speed for better community ambulation and to reduce fall risk. Baseline: 0.86 m/s 10/21/23: 0.95 m/s normal speed; 1.2 m/s at fast speed Goal status: INITIAL  4.  Patient will improve ABC scale score >80% to demonstrate increased confidence with functional mobility and ADLs. Baseline: 57.5% corrected on 7/10 10/21/23: 64.4%  Goal status: INITIAL  5.  Patient will increase Functional Gait Assessment (FGA) score to >20/30 as to reduce fall risk and improve dynamic gait safety with community ambulation.  Baseline: 16/30 on 7/10 10/21/23: 17/30 Goal status: INITIAL   ASSESSMENT:  CLINICAL IMPRESSION:  Today's session focused on assessing HEP and making appropriate modifications to promote independence & discharge planning. Also completed stair stepping to promote SLS & functional strength; patient requiring BUE support at railing d/t balance deficits noted during stance phase. Pt continued to have questions this session regarding POC as related to frequency, plan for future sessions.  Pt will continue to benefit from skilled therapy to address remaining deficits in order to improve overall QoL and return to PLOF.     OBJECTIVE IMPAIRMENTS: Abnormal gait, decreased activity tolerance, decreased balance, decreased endurance, decreased mobility, difficulty walking, decreased strength, impaired sensation, and pain.   ACTIVITY LIMITATIONS:  carrying, lifting, bending, standing, squatting, stairs, transfers, bathing, dressing, reach over head, and locomotion level  PARTICIPATION LIMITATIONS: meal prep, cleaning, laundry, shopping, community activity, and yard work  PERSONAL FACTORS: Age, Fitness, Time since onset of injury/illness/exacerbation, and 3+ comorbidities: Post-Polio Syndrome, Osteoporosis, Arthritis, GERD, HTN, Insomnia, and Melanoma are also affecting patient's functional outcome.   REHAB POTENTIAL: Good  CLINICAL DECISION MAKING: Evolving/moderate complexity  EVALUATION COMPLEXITY: Moderate  PLAN:  PT FREQUENCY: 1-2x/week   PT DURATION: 12 weeks  PLANNED INTERVENTIONS: [id number]- PT Re-evaluation, 97750- Physical Performance Testing, 97110-Therapeutic exercises, 97530- Therapeutic activity, [id number]- Neuromuscular re-education, 97535- Self Care, [id number]- Manual therapy, [id number]- Gait training, [phone]- Orthotic Initial, [phone]- Orthotic/Prosthetic subsequent, [phone]- Canalith repositioning, [phone]- Electrical stimulation (manual), Patient/Family education, Balance training, Stair training, Joint mobilization, Vestibular training, DME instructions, Cryotherapy, Moist heat, and Biofeedback  PLAN FOR NEXT SESSION:   Assess how HEP has been going with addition of STS Add additional strengthening exercises as appropriate Balance with eyes closed to promote upregulation of sensation secondary to neuropathy Dynamic balance training weight shifting tasks to improve tolerance to single-leg stance - foot taps L side parascapular activation.  B LE strengthening within limits of knee pain Specifically L hip extensors and abductors Ankle DFs Hip flexors    Chiquita Silvan, SPT Physical Therapy Student - [place]  [facility] [place]  11/04/23, 11:12 AM

## 2023-11-11 ENCOUNTER — Ambulatory Visit: Attending: Neurology | Admitting: Physical Therapy

## 2023-11-11 DIAGNOSIS — R531 Weakness: Secondary | ICD-10-CM | POA: Insufficient documentation

## 2023-11-11 DIAGNOSIS — R269 Unspecified abnormalities of gait and mobility: Secondary | ICD-10-CM | POA: Diagnosis not present

## 2023-11-11 DIAGNOSIS — R2681 Unsteadiness on feet: Secondary | ICD-10-CM | POA: Insufficient documentation

## 2023-11-11 NOTE — Therapy (Signed)
 OUTPATIENT PHYSICAL THERAPY NEURO TREATMENT   Patient Name: Brandy Wong MRN: [id number] DOB:[birth year], 89 y.o., female Today's Date: 11/11/2023   PCP: Myrla Jon HERO, MD REFERRING PROVIDER: Maree Jannett POUR, MD   END OF SESSION:      PT End of Session - 11/11/23 1152     Visit Number 15    Number of Visits 24    Date for PT Re-Evaluation 12/08/23    Progress Note Due on Visit 10    PT Start Time 1150    PT Stop Time 1230    PT Time Calculation (min) 40 min    Equipment Utilized During Treatment Gait belt    Activity Tolerance Patient tolerated treatment well    Behavior During Therapy WFL for tasks assessed/performed             Past Medical History:  Diagnosis Date   Arthritis    Cancer (HCC) 2013   R breast, s/p radiation and lumpectomy   Cataract    R eye   Chronic cystitis    Female bladder prolapse    Frequent urination    GERD (gastroesophageal reflux disease)    Hypertension    Insomnia    Melanoma (HCC)    left upper limb   Squamous cell carcinoma in situ    Past Surgical History:  Procedure Laterality Date   APPENDECTOMY     BREAST EXCISIONAL BIOPSY Left    benign   BREAST EXCISIONAL BIOPSY Left    benign   BREAST SURGERY Right 2013   lumpectomy post positive breast ca, radiation   CATARACT EXTRACTION     LAPAROSCOPIC VAGINAL HYSTERECTOMY WITH SALPINGO OOPHORECTOMY     TUBAL LIGATION     Patient Active Problem List   Diagnosis Date Noted   Hot flashes 08/04/2023   Hyperlipidemia 06/23/2023   History of recurrent UTIs 06/23/2023   Chronic cystitis 06/23/2023   Melanoma of skin (HCC) 06/23/2023   Osteoporosis 06/23/2022   Syncope 06/23/2022   Senile purpura (HCC) 12/19/2021   Vasomotor symptoms due to menopause 07/29/2021   Chronic cough 11/15/2020   Non-healing wound of lower extremity 11/15/2020   Synovial cyst of hand 11/15/2020   Insomnia 11/08/2018   Balance problem 11/08/2018   GERD (gastroesophageal reflux disease)  10/31/2016   Essential hypertension 10/31/2016   Avitaminosis D 10/31/2016   History of uterine prolapse 05/19/2013   History of breast cancer 11/05/2012   SUI (stress urinary incontinence, female) 03/18/2012   Pelvic relaxation due to enterocele, vaginal 12/16/2011    ONSET DATE: ~1-2 years ago  REFERRING DIAG: G62.9 (ICD-10-CM) - Polyneuropathy, unspecified   THERAPY DIAG:     Unsteadiness on feet  Abnormality of gait  Generalized weakness  Rationale for Evaluation and Treatment: Rehabilitation  SUBJECTIVE:  SUBJECTIVE STATEMENT:    Pt stated that she was able to go to the park and walk further than she previously had without any issues; she notes that she did avoid the very steep hill located at the park. She notes that she has slowly been increasing her distance during her walks around her neighborhood as well.   Pt reported that STSs with blankets/pillow in chair went well, no increased knee pain. HEP has been going well, continuing to use ankle weights with appropriate exercises.   Physical with MD 11/24/23. No updates regarding medical hx/medications.   Pt inquiring regarding hormone medication, if there is benefit regarding strength.    Pt continuing to report noticeable changes related to strength, but has not noticed much of a difference in balance. Pt reported that she noticed increased difficulty with balance when she is more fatigued.  Pt also noted a small improvement in ability to navigate curbs in community.       FROM EVAL: Pt states MD believes her symptoms are a result of post-polio. Pt states when she had polio at 14 or 89y.o., she went to rehab for 4 months, and came home and was completely independent without aids/braces. Pt states she golfed for ~50years to stay active,  reports she stopped playing ~3 years ago due to concern of falling on uneven/compliant surface of grass. Pt states now she walks her dog 2x/day for exercise. Pt states when she starts the walk, she is doing good, but then she quickly notices fatigue in her B LE muscles and worsening of her balance. Pt states she has recently been diagnosed with polyneuropathy.   Pt states balance and B LE lower leg weakness (below her knees) are her greatest deficits.   Reports she also does yoga and tried to perform tree pose, standing on 1 foot, which made her realize her balance deficits.   Pt states she performs the below exercises at least every other day:  Single leg stance Yoga stretches in standing and sitting  Sun Pose Childs pose Forward and lateral foot taps Stopped doing deep knee bends due to pain, and MD recommended she discontinue this exercise Supine bicycle ab exercises Standing heel raises Seated piriformis/trunk rotation stretch Quadruped bird dogs Prone Superman  Pt states she gets on/off the floor to do her exercises and is able to do this successfully without support from a chair/counter.  Pt reports she has noticed she is weak when going to step-up onto the 1 step to enter/exit her back porch.  Pt reports increased postural sway during gait and will tend to lean towards the side her purse is on her shoulder.   Pt states she also doesn't sleep well, which impacts what she feels up to doing each day.   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 Severe sensory motor polyneuropathy confirmed by nerve conduction study. Sensory neuropathy contributes to balance issues due to impaired pressure sensation in feet, affecting her  ability to make micro adjustments for balance. Lack of vibration sensation in feet noted, affecting balance.  Chronic denervation in limbs confirmed by EMG (05/2023). Polio diagnosed in the 1950s, likely contributing to current weakness and fatigue. Symptoms include leg weakness, fatigue, and balance issues. Post-polio syndrome contributes to muscle weakness, affecting her ability to prevent falls. Emphasized the  need for progressive muscle loading and avoiding overexertion.   PAIN:  Are you having pain? No, but pt does states she will sometimes have back pain if she stands for too long (especially when working in her kitchen)  PRECAUTIONS: Fall  RED FLAGS: None   WEIGHT BEARING RESTRICTIONS: No  FALLS: Has patient fallen in last 6 months? No, but pt states sometimes she stumbles when getting out of the bed too quickly to get to the bathroom  LIVING ENVIRONMENT: Lives with: lives alone Lives in: Other 1 story townhouse Stairs: enters through garage mostly, which has ramped entrance; but does have 1 step to enter/exit front and back doors of home Has following equipment at home: Single point cane, Grab bars, and built in shower seat  PLOF: Independent, but has recently hired support to help with household tasks  PATIENT GOALS: Improve balance and strength legs  OBJECTIVE:  Note: Objective measures were completed at Evaluation unless otherwise noted.  DIAGNOSTIC FINDINGS:  Per MD note on 07/14/2023 NCS/EMG lower extremity (05/12/23) Impression: This is an abnormal electrodiagnostic study consistent with 1) generalized severe sensorimotor polyneuropathy. 2) EMG changes in both lower extremities shows signs of chronic denervation.  COGNITION: Overall cognitive status: Within functional limits for tasks assessed   SENSATION: Able to sense touch on screen, but pt reports LEs feel numb in some places and had NCS/EMG nerve conduction showing decreased sensation MD note stating  absent vibratory sense  COORDINATION: WFL, abnormal movement patterns due to weakness, but not cerebellar incoordination  EDEMA:  Not formally assessed, none observed  MUSCLE TONE: Not formally assessed  MUSCLE LENGTH: Not formally assessed  DTRs:  Not formally assessed  POSTURE: rounded shoulders, forward head, and posterior pelvic tilt  LOWER EXTREMITY ROM:     Active   WFL in B LEs for functional mobility tasks  Right Eval Left Eval  Hip flexion    Hip extension    Hip abduction    Hip adduction    Hip internal rotation    Hip external rotation    Knee flexion    Knee extension    Ankle dorsiflexion    Ankle plantarflexion    Ankle inversion    Ankle eversion     (Blank rows = not tested)  LOWER EXTREMITY MMT:    MMT Right Eval Left Eval  Hip flexion 3+ 3+  Hip extension    Hip abduction    Hip adduction    Hip internal rotation    Hip external rotation    Knee flexion 4- 4  Knee extension 4 4  Ankle dorsiflexion 3+ 3+  Ankle plantarflexion 4- 4-  Ankle inversion    Ankle eversion    (Blank rows = not tested)  Manual Muscle Test Scale 0/5 = No muscle contraction can be seen or felt 1/5 = Contraction can be felt, but there is no motion 2-/5 = Part moves through incomplete ROM w/ gravity decreased 2/5 = Part moves through complete ROM w/ gravity decreased 2+/5 = Part moves through incomplete ROM (<50%) against gravity or through complete ROM w/ gravity 3-/5 = Part moves through incomplete ROM (>50%) against gravity 3/5 = Part moves through complete ROM against gravity 3+/5 = Part moves through complete ROM against gravity/slight resistance 4-/5= Holds test position against slight to moderate pressure 4/5 = Part moves through complete ROM against gravity/moderate resistance 4+/5= Holds test position against moderate to strong pressure 5/5 = Part moves through complete ROM against gravity/full resistance  BED MOBILITY:  Not  tested  TRANSFERS: Sit to stand: Complete Independence and Modified independence  Assistive device utilized: None     Stand to sit: Complete Independence and Modified independence  Assistive device utilized: None     Chair to chair: Complete Independence and Modified independence  Assistive device utilized: None       RAMP:  Not tested  CURB:  Not tested  STAIRS: Findings: Level of Assistance: CGA, Stair Negotiation Technique: Alternating Pattern  with Bilateral Rails, Number of Stairs: 4, Height of Stairs: 6in   , and Comments: decreased ability to power up during ascent and decreased eccentric control on descent with pt relying on B UE support on the railings GAIT: Findings: Gait Characteristics: step through pattern, decreased step length- Right, decreased step length- Left, decreased hip/knee flexion- Right, decreased hip/knee flexion- Left, decreased trunk rotation, and wide BOS, Distance walked: ~179ft, Assistive device utilized:None, Level of assistance: SBA, and Comments: increased arm swing bilaterally, lack of trunk rotation, lack of full hip/knee flexion during swing with partial stiff legged gait pattern., slightly wider BOS    FUNCTIONAL TESTS:  5 times sit to stand: 9.77 seconds from green chair, no UE support 6 minute walk test: 915 ft on 7/10 with no AD 10 meter walk test:  0.86 m/s, no AD Berg Balance Scale: 45/56 on 7/10 Functional gait assessment: 16/30 on 7/10   PATIENT SURVEYS:  ABC scale: The Activities-Specific Balance Confidence (ABC) Scale 0% 10 20 30  40 50 60 70 80 90 100% No confidence<->completely confident  "How confident are you that you will not lose your balance or become unsteady when you . . .   Date tested 09/15/2023- corrected on 7/10 per pt request 10/21/23:  Walk around the house 80% 90%  2. Walk up or down stairs 30% 20%  3. Bend over and pick up a slipper from in front of a closet floor 90% 100%  4. Reach for a small can off a shelf at eye  level 90% 100%  5. Stand on tip toes and reach for something above your head 80% 50%  6. Stand on a chair and reach for something 0% 20%  7. Sweep the floor 90% 90%  8. Walk outside the house to a car parked in the driveway 50% 100%  9. Get into or out of a car 90% 100%  10. Walk across a parking lot to the mall 90% 50%  11. Walk up or down a ramp 100% 60%  12. Walk in a crowded mall where people rapidly walk past you 60% 90%  13. Are bumped into by people as you walk through the mall 50% 70%  14. Step onto or off of an escalator while you are holding onto the railing 20% 60%  15. Step onto or off an escalator while holding onto parcels such that you cannot hold onto the railing 0% 20%  16. Walk outside on icy sidewalks 0% 10%  Total: #/16 57.5% 64.4%  TREATMENT DATE: 11/11/2023  Discussed how PT is unaware of benefit of hormone medication for strengthening, deferred questioning to MD. Pt educated that reports of improved sleep & resulting effects on energy levels may be helpful for strengthening.   Pt educated on benefit of continuing to gradually increase distance/time spent walking at park and/or neighborhood. Pt also educated on benefit of HEP for strengthening, especially when performed consistently for optimal benefits.   Pt educated on plan for session to focus on strength, then transition to balance activities to recreate fatigue, resulting in more challenge to balance systems for optimal improvement.   Unless otherwise noted, gait belt donned & CGA provided for interventions.   Stair stepping w 3# AW on BLEs, 2x10 each LE to promote SLS When completing with L LE as stance leg, more difficult as evidenced by increased reliance on BUE support at railing, especially during eccentric portion of movement With R LE as stance leg, pt continuing to require 1 UE  support at railing for eccentric portion of movement For each LE, pt able to progress to no UE support during concentric portion of movement  10x STS with airex pad in green chair for increased seat height > progressed to 10x STS in green chair without airex Pt verbally cued for slowing movement down to decrease reliance on momentum for activity, increase difficulty of movement  Pt not requiring UE support during activity  Tandem stance with eyes open with hands hovering at bar, 1 min. Pt continuing to demo some lateral swaying, continuing to improve from previous sessions  Standing with eyes closed, hands hovering at bar, 2x30sec Pt with initial significant posterior sway for both trials, able to recover independently without physical therapist assistance; SBA for safety.  Pt continuing to sway posterior & anterior throughout both trials, decreased with second trial Pt reported that she could not feel herself swaying; educated on deficit.   Gait with head turns to look at cards, CGA; >250'  Verbal cueing throughout for increased gait speed Pt completing turning at end of hallway with additional caution, as evidenced by increased number of steps & increased time to complete turn Pt demonstrating instability intermittently, especially when looking up & to the side. Pt without significan LOB, not requiring assistance from therapist for balance recovery Pt frequently deviating from pathway, especially with fatigue Pt reported that she felt unsteady during activity, felt activity was a good challenge for her balance     PATIENT EDUCATION: Education details: Pt educated throughout session about proper posture and technique with exercises. Improved exercise technique, movement at target joints, use of target muscles after min to mod verbal, visual, tactile cues.   Person educated: Patient Education method: Explanation and Handouts Education comprehension: verbalized understanding and needs  further education  HOME EXERCISE PROGRAM: Access Code: [email or web address] URL: [email or web address] Date: 11/04/2023 Prepared by: Chiquita Silvan  Exercises - Seated Ankle Dorsiflexion with Anchored Resistance  - 1 x daily - 7 x weekly - 2 sets - 10 reps - Standing March with Counter Support  - 1 x daily - 7 x weekly - 2 sets - 10 reps - Standing Hip Abduction with Resistance at Ankles and Counter Support  - 1 x daily - 3 x weekly - 2 sets - 10 reps - Lateral Foot taps with Counter Support  - 1 x daily - 7 x weekly - 2 sets - 10 reps - Standing Romberg to 3/4 Tandem Stance  - 1 x daily - 7 x weekly - 2  sets - 30 seconds hold - Sit to Stand with Arms Crossed  - 2 x weekly - 2 sets - 10 reps   GOALS: Goals reviewed with patient? Yes  SHORT TERM GOALS: Target date: 10/27/2023  Patient will be independent with home exercise program to improve strength/mobility for better functional independence with ADLs and community level activities.  Baseline: initiated on 09/15/2023 Goal status: INITIAL  LONG TERM GOALS: Target date: 12/08/2023   Patient will increase Berg Balance score to > 51/56 to demonstrate improved balance and decreased fall risk during functional activities and ADLs.  Baseline: 45/56 on 7/10 10/21/23: 47/56 Goal status: INITIAL  2.  Patient will increase six minute walk test distance to >104ft for progression to community level ambulation, demonstrating improved gait endurance.  Baseline: 915 ft on 7/10 with no AD 10/21/23: 1074' with no AD Goal status: INITIAL  3.  Patient will increase 10 meter walk test to >1.55m/s as to improve gait speed for better community ambulation and to reduce fall risk. Baseline: 0.86 m/s 10/21/23: 0.95 m/s normal speed; 1.2 m/s at fast speed Goal status: INITIAL  4.  Patient will improve ABC scale score >80% to demonstrate increased confidence with functional mobility and ADLs. Baseline: 57.5% corrected on 7/10 10/21/23: 64.4%  Goal  status: INITIAL  5.  Patient will increase Functional Gait Assessment (FGA) score to >20/30 as to reduce fall risk and improve dynamic gait safety with community ambulation.  Baseline: 16/30 on 7/10 10/21/23: 17/30 Goal status: INITIAL   ASSESSMENT:  CLINICAL IMPRESSION:     Today's session focused on strength exercises followed by balance activities d/t pt report of decreased balance with fatigue. Pt completed stair stepping to promote SLS & functional strength; pt able to progress with stair stepping to no UE support during concentric portion, relying on UE support only during eccentric portion this date. Pt also able to progress to STS from chair without airex pad for added seat height without increased knee pain. Pt demonstrating instability with eyes closed during standing & dynamic gait with head turns; no significant LOB requiring therapist intervention for balance recovery.  Pt will continue to benefit from skilled therapy to address remaining deficits in order to improve overall QoL and return to PLOF.     OBJECTIVE IMPAIRMENTS: Abnormal gait, decreased activity tolerance, decreased balance, decreased endurance, decreased mobility, difficulty walking, decreased strength, impaired sensation, and pain.   ACTIVITY LIMITATIONS: carrying, lifting, bending, standing, squatting, stairs, transfers, bathing, dressing, reach over head, and locomotion level  PARTICIPATION LIMITATIONS: meal prep, cleaning, laundry, shopping, community activity, and yard work  PERSONAL FACTORS: Age, Fitness, Time since onset of injury/illness/exacerbation, and 3+ comorbidities: Post-Polio Syndrome, Osteoporosis, Arthritis, GERD, HTN, Insomnia, and Melanoma are also affecting patient's functional outcome.   REHAB POTENTIAL: Good  CLINICAL DECISION MAKING: Evolving/moderate complexity  EVALUATION COMPLEXITY: Moderate  PLAN:  PT FREQUENCY: 1-2x/week   PT DURATION: 12 weeks  PLANNED INTERVENTIONS: [id number]-  PT Re-evaluation, 97750- Physical Performance Testing, 97110-Therapeutic exercises, 97530- Therapeutic activity, [id number]- Neuromuscular re-education, 97535- Self Care, [id number]- Manual therapy, [id number]- Gait training, [phone]- Orthotic Initial, [phone]- Orthotic/Prosthetic subsequent, [phone]- Canalith repositioning, [phone]- Electrical stimulation (manual), Patient/Family education, Balance training, Stair training, Joint mobilization, Vestibular training, DME instructions, Cryotherapy, Moist heat, and Biofeedback  PLAN FOR NEXT SESSION:    Resisted gait; blaze pods B LE strengthening within limits of knee pain Specifically L hip extensors and abductors Ankle DFs Hip flexors  Balance with eyes closed to promote upregulation of sensation secondary to neuropathy* Dynamic  balance training after strengthening exercises/when fatigued weight shifting tasks to improve tolerance to single-leg stance - foot taps  Add additional strengthening exercises as appropriate to HEP  Chiquita Silvan, SPT Physical Therapy Student - [facility]  [facility]  11/11/23, 1:35 PM

## 2023-11-16 ENCOUNTER — Ambulatory Visit

## 2023-11-19 ENCOUNTER — Ambulatory Visit

## 2023-11-19 DIAGNOSIS — R531 Weakness: Secondary | ICD-10-CM | POA: Diagnosis not present

## 2023-11-19 DIAGNOSIS — R2681 Unsteadiness on feet: Secondary | ICD-10-CM | POA: Diagnosis not present

## 2023-11-19 DIAGNOSIS — R269 Unspecified abnormalities of gait and mobility: Secondary | ICD-10-CM

## 2023-11-19 NOTE — Therapy (Signed)
 OUTPATIENT PHYSICAL THERAPY NEURO TREATMENT   Patient Name: Brandy Wong MRN: [id number] DOB:Dec 09, 1934, 89 y.o., female Today's Date: 11/19/2023   PCP: Myrla Jon HERO, MD REFERRING PROVIDER: Maree Jannett POUR, MD   END OF SESSION:      PT End of Session - 11/19/23 1536     Visit Number 16    Number of Visits 24    Date for PT Re-Evaluation 12/08/23    Progress Note Due on Visit 10    PT Start Time 1532    PT Stop Time 1615    PT Time Calculation (min) 43 min    Equipment Utilized During Treatment Gait belt    Activity Tolerance Patient tolerated treatment well    Behavior During Therapy WFL for tasks assessed/performed          Past Medical History:  Diagnosis Date   Arthritis    Cancer (HCC) 2013   R breast, s/p radiation and lumpectomy   Cataract    R eye   Chronic cystitis    Female bladder prolapse    Frequent urination    GERD (gastroesophageal reflux disease)    Hypertension    Insomnia    Melanoma (HCC)    left upper limb   Squamous cell carcinoma in situ    Past Surgical History:  Procedure Laterality Date   APPENDECTOMY     BREAST EXCISIONAL BIOPSY Left    benign   BREAST EXCISIONAL BIOPSY Left    benign   BREAST SURGERY Right 2013   lumpectomy post positive breast ca, radiation   CATARACT EXTRACTION     LAPAROSCOPIC VAGINAL HYSTERECTOMY WITH SALPINGO OOPHORECTOMY     TUBAL LIGATION     Patient Active Problem List   Diagnosis Date Noted   Hot flashes 08/04/2023   Hyperlipidemia 06/23/2023   History of recurrent UTIs 06/23/2023   Chronic cystitis 06/23/2023   Melanoma of skin (HCC) 06/23/2023   Osteoporosis 06/23/2022   Syncope 06/23/2022   Senile purpura (HCC) 12/19/2021   Vasomotor symptoms due to menopause 07/29/2021   Chronic cough 11/15/2020   Non-healing wound of lower extremity 11/15/2020   Synovial cyst of hand 11/15/2020   Insomnia 11/08/2018   Balance problem 11/08/2018   GERD (gastroesophageal reflux disease)  10/31/2016   Essential hypertension 10/31/2016   Avitaminosis D 10/31/2016   History of uterine prolapse 05/19/2013   History of breast cancer 11/05/2012   SUI (stress urinary incontinence, female) 03/18/2012   Pelvic relaxation due to enterocele, vaginal 12/16/2011    ONSET DATE: ~1-2 years ago  REFERRING DIAG: G62.9 (ICD-10-CM) - Polyneuropathy, unspecified   THERAPY DIAG:     Unsteadiness on feet  Abnormality of gait  Generalized weakness  Rationale for Evaluation and Treatment: Rehabilitation  SUBJECTIVE:  SUBJECTIVE STATEMENT:     Pt reports she feels this is going to be her last month of therapy.  Pt notes that she feels that later appointments are more difficult because she's wiped by the end of the day.     FROM EVAL: Pt states MD believes her symptoms are a result of post-polio. Pt states when she had polio at 14 or 89y.o., she went to rehab for 4 months, and came home and was completely independent without aids/braces. Pt states she golfed for ~50years to stay active, reports she stopped playing ~3 years ago due to concern of falling on uneven/compliant surface of grass. Pt states now she walks her dog 2x/day for exercise. Pt states when she starts the walk, she is doing good, but then she quickly notices fatigue in her B LE muscles and worsening of her balance. Pt states she has recently been diagnosed with polyneuropathy.   Pt states balance and B LE lower leg weakness (below her knees) are her greatest deficits.   Reports she also does yoga and tried to perform tree pose, standing on 1 foot, which made her realize her balance deficits.   Pt states she performs the below exercises at least every other day:  Single leg stance Yoga stretches in standing and sitting  Sun Pose Childs  pose Forward and lateral foot taps Stopped doing deep knee bends due to pain, and MD recommended she discontinue this exercise Supine bicycle ab exercises Standing heel raises Seated piriformis/trunk rotation stretch Quadruped bird dogs Prone Superman  Pt states she gets on/off the floor to do her exercises and is able to do this successfully without support from a chair/counter.  Pt reports she has noticed she is weak when going to step-up onto the 1 step to enter/exit her back porch.  Pt reports increased postural sway during gait and will tend to lean towards the side her purse is on her shoulder.   Pt states she also doesn't sleep well, which impacts what she feels up to doing each day.   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 Severe sensory motor polyneuropathy confirmed by nerve conduction study. Sensory neuropathy contributes to balance issues due to impaired pressure sensation in feet, affecting her ability to make micro adjustments for balance. Lack of vibration sensation in feet noted, affecting balance.  Chronic denervation in limbs confirmed by EMG (05/2023). Polio diagnosed in the 1950s, likely contributing to current weakness and fatigue. Symptoms include leg weakness, fatigue, and balance issues. Post-polio syndrome contributes to muscle weakness, affecting her ability to prevent falls. Emphasized the need for progressive muscle loading and avoiding overexertion.   PAIN:  Are you having pain? No, but pt does states she will sometimes have back pain if she stands for too long (especially when working in her kitchen)  PRECAUTIONS: Fall  RED FLAGS: None   WEIGHT BEARING RESTRICTIONS: No  FALLS: Has patient  fallen in last 6 months? No, but pt states sometimes she stumbles when getting out of the bed too quickly to get to the bathroom  LIVING ENVIRONMENT: Lives with: lives alone Lives in: Other 1 story townhouse Stairs: enters through garage mostly, which has ramped entrance; but does have 1 step to enter/exit front and back doors of home Has following equipment at home: Single  point cane, Grab bars, and built in shower seat  PLOF: Independent, but has recently hired support to help with household tasks  PATIENT GOALS: Improve balance and strength legs  OBJECTIVE:  Note: Objective measures were completed at Evaluation unless otherwise noted.  DIAGNOSTIC FINDINGS:  Per MD note on 07/14/2023 NCS/EMG lower extremity (05/12/23) Impression: This is an abnormal electrodiagnostic study consistent with 1) generalized severe sensorimotor polyneuropathy. 2) EMG changes in both lower extremities shows signs of chronic denervation.  COGNITION: Overall cognitive status: Within functional limits for tasks assessed   SENSATION: Able to sense touch on screen, but pt reports LEs feel numb in some places and had NCS/EMG nerve conduction showing decreased sensation MD note stating absent vibratory sense  COORDINATION: WFL, abnormal movement patterns due to weakness, but not cerebellar incoordination  EDEMA:  Not formally assessed, none observed  MUSCLE TONE: Not formally assessed  MUSCLE LENGTH: Not formally assessed  DTRs:  Not formally assessed  POSTURE: rounded shoulders, forward head, and posterior pelvic tilt  LOWER EXTREMITY ROM:     Active   WFL in B LEs for functional mobility tasks  Right Eval Left Eval  Hip flexion    Hip extension    Hip abduction    Hip adduction    Hip internal rotation    Hip external rotation    Knee flexion    Knee extension    Ankle dorsiflexion    Ankle plantarflexion    Ankle inversion    Ankle eversion     (Blank rows = not  tested)  LOWER EXTREMITY MMT:    MMT Right Eval Left Eval  Hip flexion 3+ 3+  Hip extension    Hip abduction    Hip adduction    Hip internal rotation    Hip external rotation    Knee flexion 4- 4  Knee extension 4 4  Ankle dorsiflexion 3+ 3+  Ankle plantarflexion 4- 4-  Ankle inversion    Ankle eversion    (Blank rows = not tested)  Manual Muscle Test Scale 0/5 = No muscle contraction can be seen or felt 1/5 = Contraction can be felt, but there is no motion 2-/5 = Part moves through incomplete ROM w/ gravity decreased 2/5 = Part moves through complete ROM w/ gravity decreased 2+/5 = Part moves through incomplete ROM (<50%) against gravity or through complete ROM w/ gravity 3-/5 = Part moves through incomplete ROM (>50%) against gravity 3/5 = Part moves through complete ROM against gravity 3+/5 = Part moves through complete ROM against gravity/slight resistance 4-/5= Holds test position against slight to moderate pressure 4/5 = Part moves through complete ROM against gravity/moderate resistance 4+/5= Holds test position against moderate to strong pressure 5/5 = Part moves through complete ROM against gravity/full resistance  BED MOBILITY:  Not tested  TRANSFERS: Sit to stand: Complete Independence and Modified independence  Assistive device utilized: None     Stand to sit: Complete Independence and Modified independence  Assistive device utilized: None     Chair to chair: Complete Independence and Modified independence  Assistive device utilized: None       RAMP:  Not tested  CURB:  Not tested  STAIRS: Findings: Level of Assistance: CGA, Stair Negotiation Technique: Alternating Pattern  with Bilateral Rails, Number of Stairs: 4, Height of Stairs: 6in   , and Comments: decreased ability to power up during ascent and decreased eccentric control on descent with pt relying on B UE support on the railings GAIT:  Findings: Gait Characteristics: step through pattern,  decreased step length- Right, decreased step length- Left, decreased hip/knee flexion- Right, decreased hip/knee flexion- Left, decreased trunk rotation, and wide BOS, Distance walked: ~111ft, Assistive device utilized:None, Level of assistance: SBA, and Comments: increased arm swing bilaterally, lack of trunk rotation, lack of full hip/knee flexion during swing with partial stiff legged gait pattern., slightly wider BOS    FUNCTIONAL TESTS:  5 times sit to stand: 9.77 seconds from green chair, no UE support 6 minute walk test: 915 ft on 7/10 with no AD 10 meter walk test:  0.86 m/s, no AD Berg Balance Scale: 45/56 on 7/10 Functional gait assessment: 16/30 on 7/10   PATIENT SURVEYS:  ABC scale: The Activities-Specific Balance Confidence (ABC) Scale 0% 10 20 30  40 50 60 70 80 90 100% No confidence<->completely confident  "How confident are you that you will not lose your balance or become unsteady when you . . .   Date tested 09/15/2023- corrected on 7/10 per pt request 10/21/23:  Walk around the house 80% 90%  2. Walk up or down stairs 30% 20%  3. Bend over and pick up a slipper from in front of a closet floor 90% 100%  4. Reach for a small can off a shelf at eye level 90% 100%  5. Stand on tip toes and reach for something above your head 80% 50%  6. Stand on a chair and reach for something 0% 20%  7. Sweep the floor 90% 90%  8. Walk outside the house to a car parked in the driveway 50% 100%  9. Get into or out of a car 90% 100%  10. Walk across a parking lot to the mall 90% 50%  11. Walk up or down a ramp 100% 60%  12. Walk in a crowded mall where people rapidly walk past you 60% 90%  13. Are bumped into by people as you walk through the mall 50% 70%  14. Step onto or off of an escalator while you are holding onto the railing 20% 60%  15. Step onto or off an escalator while holding onto parcels such that you cannot hold onto the railing 0% 20%  16. Walk outside on icy sidewalks 0% 10%   Total: #/16 57.5% 64.4%                                                                                                                                 TREATMENT DATE: 11/19/2023   TherAct: To improve functional movements patterns for everyday tasks  STS's 3x10 without UE support and verbal cuing for butt actually sitting and slowed eccentric lowering to promote self control and improved balance  Ambulation to the cancer center and back (~1000') while wearing 5# AW's and consistent verbal cues for foot clearance to improve endurance levels.   Neuromuscular Re-Ed: To facilitate reeducation of movement, balance, posture, coordination, and/or proprioception/kinesthetic sense.  Static stance on  Airex pad, 30 sec bouts Static stance on Airex pad, eyes closed 30 sec bouts Static NBOS on Airex pad, 30 sec bouts Static NBOS on Airex pad, eyes closed, 30 sec bouts Static staggered stance on Airex pad, 30 sec bouts Static staggered stance on Airex pad, eyes closed, 30 sec bouts      PATIENT EDUCATION: Education details: Pt educated throughout session about proper posture and technique with exercises. Improved exercise technique, movement at target joints, use of target muscles after min to mod verbal, visual, tactile cues.   Person educated: Patient Education method: Explanation and Handouts Education comprehension: verbalized understanding and needs further education  HOME EXERCISE PROGRAM: Access Code: [email or web address] URL: [email or web address] Date: 11/04/2023 Prepared by: Chiquita Silvan  Exercises - Seated Ankle Dorsiflexion with Anchored Resistance  - 1 x daily - 7 x weekly - 2 sets - 10 reps - Standing March with Counter Support  - 1 x daily - 7 x weekly - 2 sets - 10 reps - Standing Hip Abduction with Resistance at Ankles and Counter Support  - 1 x daily - 3 x weekly - 2 sets - 10 reps - Lateral Foot taps with Counter Support  - 1 x daily - 7 x weekly - 2 sets - 10  reps - Standing Romberg to 3/4 Tandem Stance  - 1 x daily - 7 x weekly - 2 sets - 30 seconds hold - Sit to Stand with Arms Crossed  - 2 x weekly - 2 sets - 10 reps   GOALS: Goals reviewed with patient? Yes  SHORT TERM GOALS: Target date: 10/27/2023  Patient will be independent with home exercise program to improve strength/mobility for better functional independence with ADLs and community level activities.  Baseline: initiated on 09/15/2023 Goal status: INITIAL  LONG TERM GOALS: Target date: 12/08/2023   Patient will increase Berg Balance score to > 51/56 to demonstrate improved balance and decreased fall risk during functional activities and ADLs.  Baseline: 45/56 on 7/10 10/21/23: 47/56 Goal status: INITIAL  2.  Patient will increase six minute walk test distance to >1025ft for progression to community level ambulation, demonstrating improved gait endurance.  Baseline: 915 ft on 7/10 with no AD 10/21/23: 1074' with no AD Goal status: INITIAL  3.  Patient will increase 10 meter walk test to >1.46m/s as to improve gait speed for better community ambulation and to reduce fall risk. Baseline: 0.86 m/s 10/21/23: 0.95 m/s normal speed; 1.2 m/s at fast speed Goal status: INITIAL  4.  Patient will improve ABC scale score >80% to demonstrate increased confidence with functional mobility and ADLs. Baseline: 57.5% corrected on 7/10 10/21/23: 64.4%  Goal status: INITIAL  5.  Patient will increase Functional Gait Assessment (FGA) score to >20/30 as to reduce fall risk and improve dynamic gait safety with community ambulation.  Baseline: 16/30 on 7/10 10/21/23: 17/30 Goal status: INITIAL   ASSESSMENT:  CLINICAL IMPRESSION:     Pt performed well with the tasks given and balance was challenged with the use of the airex pads.  Pt did struggle when having to perform with eyes closed, and multiple bouts were utilized, however pt put forth great effort throughout and was able to improve with  subsequent attempts.  Pt noted that she is unable to perform this task at home and appreciative of being able to do it in the clinic although she does not like losing her balance.   Pt will continue to benefit from skilled therapy to address remaining deficits  in order to improve overall QoL and return to PLOF.       OBJECTIVE IMPAIRMENTS: Abnormal gait, decreased activity tolerance, decreased balance, decreased endurance, decreased mobility, difficulty walking, decreased strength, impaired sensation, and pain.   ACTIVITY LIMITATIONS: carrying, lifting, bending, standing, squatting, stairs, transfers, bathing, dressing, reach over head, and locomotion level  PARTICIPATION LIMITATIONS: meal prep, cleaning, laundry, shopping, community activity, and yard work  PERSONAL FACTORS: Age, Fitness, Time since onset of injury/illness/exacerbation, and 3+ comorbidities: Post-Polio Syndrome, Osteoporosis, Arthritis, GERD, HTN, Insomnia, and Melanoma are also affecting patient's functional outcome.   REHAB POTENTIAL: Good  CLINICAL DECISION MAKING: Evolving/moderate complexity  EVALUATION COMPLEXITY: Moderate  PLAN:  PT FREQUENCY: 1-2x/week   PT DURATION: 12 weeks  PLANNED INTERVENTIONS: [id number]- PT Re-evaluation, 97750- Physical Performance Testing, 97110-Therapeutic exercises, 97530- Therapeutic activity, [id number]- Neuromuscular re-education, 97535- Self Care, [id number]- Manual therapy, [id number]- Gait training, [phone]- Orthotic Initial, [phone]- Orthotic/Prosthetic subsequent, [phone]- Canalith repositioning, [phone]- Electrical stimulation (manual), Patient/Family education, Balance training, Stair training, Joint mobilization, Vestibular training, DME instructions, Cryotherapy, Moist heat, and Biofeedback  PLAN FOR NEXT SESSION:     Resisted gait; blaze pods B LE strengthening within limits of knee pain Specifically L hip extensors and abductors Ankle DFs Hip flexors  Balance with eyes closed to promote  upregulation of sensation secondary to neuropathy* Dynamic balance training after strengthening exercises/when fatigued weight shifting tasks to improve tolerance to single-leg stance - foot taps  Add additional strengthening exercises as appropriate to HEP   Fonda Simpers, PT, DPT Physical Therapist - [facility]  [facility]  11/19/23, 5:56 PM

## 2023-11-24 ENCOUNTER — Ambulatory Visit: Admitting: Physical Therapy

## 2023-11-24 ENCOUNTER — Encounter: Payer: Self-pay | Admitting: Family Medicine

## 2023-11-24 ENCOUNTER — Ambulatory Visit: Admitting: Family Medicine

## 2023-11-24 VITALS — BP 140/80 | HR 79 | Ht 61.0 in | Wt 118.0 lb

## 2023-11-24 DIAGNOSIS — R232 Flushing: Secondary | ICD-10-CM

## 2023-11-24 DIAGNOSIS — E559 Vitamin D deficiency, unspecified: Secondary | ICD-10-CM | POA: Diagnosis not present

## 2023-11-24 DIAGNOSIS — Z0000 Encounter for general adult medical examination without abnormal findings: Secondary | ICD-10-CM

## 2023-11-24 DIAGNOSIS — Z23 Encounter for immunization: Secondary | ICD-10-CM

## 2023-11-24 DIAGNOSIS — Z87440 Personal history of urinary (tract) infections: Secondary | ICD-10-CM

## 2023-11-24 DIAGNOSIS — I10 Essential (primary) hypertension: Secondary | ICD-10-CM

## 2023-11-24 DIAGNOSIS — Z853 Personal history of malignant neoplasm of breast: Secondary | ICD-10-CM

## 2023-11-24 DIAGNOSIS — E782 Mixed hyperlipidemia: Secondary | ICD-10-CM

## 2023-11-24 DIAGNOSIS — G629 Polyneuropathy, unspecified: Secondary | ICD-10-CM | POA: Diagnosis not present

## 2023-11-24 DIAGNOSIS — Z0001 Encounter for general adult medical examination with abnormal findings: Secondary | ICD-10-CM

## 2023-11-24 DIAGNOSIS — E871 Hypo-osmolality and hyponatremia: Secondary | ICD-10-CM

## 2023-11-24 DIAGNOSIS — D692 Other nonthrombocytopenic purpura: Secondary | ICD-10-CM

## 2023-11-24 NOTE — Progress Notes (Signed)
 Annual Wellness Visit     Patient: Brandy Wong, Female    DOB: 1935/01/18, 89 y.o.   MRN: [id number] Visit Date: 11/24/2023  Today's Provider: Jon Eva, MD   Chief Complaint  Patient presents with   Annual Exam    AWV Last completed 11/18/22, CPE completed 12/23/22   Subjective    Brandy Wong is a 89 y.o. female who presents today for her Annual Wellness Visit.  Discussed the use of AI scribe software for clinical note transcription with the patient, who gave verbal consent to proceed.  History of Present Illness   Brandy Wong is an 89 year old female who presents with concerns regarding hormone replacement therapy and neuropathy.  She uses vaginal estradiol  cream for menopausal symptoms, including sleep disturbances and hot flashes. There is some improvement in sleep and a significant reduction in hot flashes. The cream also helps with recurrent UTIs and vaginal dryness. She takes nitrofurantoin  for UTI prevention and follows hygiene practices to prevent UTIs.  Neuropathy, diagnosed in February, causes numbness in her legs, especially when wearing shoes. There is pink discoloration and occasional purple-red patches on her legs. She is exploring remedies like turmeric and ginger.  She attends exercise sessions at the hospital to improve leg strength and balance, noticing some improvement in strength but not in balance. She plans to continue exercises at home and has implemented safety measures after a fall incident.             Medications: Outpatient Medications Prior to Visit  Medication Sig   BIOTIN PO Take by mouth.   Cholecalciferol 25 MCG (1000 UT) tablet Take 1 tablet by mouth daily.   estradiol  (ESTRACE  VAGINAL) 0.1 MG/GM vaginal cream Place 1 Applicatorful vaginally 3 (three) times a week.   hydrochlorothiazide  (HYDRODIURIL ) 25 MG tablet Take 1 tablet (25 mg total) by mouth daily.   losartan  (COZAAR ) 50 MG tablet Take 1 tablet (50 mg total) by  mouth daily.   MAGNESIUM PO Take 1 capsule by mouth daily. 500mg    nitrofurantoin  (MACRODANTIN ) 100 MG capsule Take 1 capsule (100 mg total) by mouth daily.   omeprazole  (PRILOSEC) 40 MG capsule TAKE 1 CAPSULE (40 MG TOTAL) BY MOUTH DAILY.   Polyethyl Glycol-Propyl Glycol (SYSTANE OP) Apply 1 drop to eye 4 (four) times daily as needed.   TURMERIC PO Take 1 tablet by mouth daily. 500mg    No facility-administered medications prior to visit.    Allergies  Allergen Reactions   Amoxicillin Rash    Patient Care Team: Eva Jon HERO, MD as PCP - General (Family Medicine) Dasher, Alm LABOR, MD (Dermatology) Mittie Gaskin, MD as Referring Physician (Ophthalmology)  Review of Systems       Objective    Vitals: BP (!) 140/80 (BP Location: Left Arm, Patient Position: Sitting, Cuff Size: Normal)   Pulse 79   Ht 5' 1 (1.549 m)   Wt 118 lb (53.5 kg)   SpO2 96%   BMI 22.30 kg/m      Physical Exam Vitals reviewed.  Constitutional:      General: She is not in acute distress.    Appearance: Normal appearance. She is well-developed. She is not diaphoretic.  HENT:     Head: Normocephalic and atraumatic.     Right Ear: Tympanic membrane, ear canal and external ear normal.     Left Ear: Tympanic membrane, ear canal and external ear normal.     Nose: Nose normal.  Mouth/Throat:     Mouth: Mucous membranes are moist.     Pharynx: Oropharynx is clear. No oropharyngeal exudate.  Eyes:     General: No scleral icterus.    Conjunctiva/sclera: Conjunctivae normal.     Pupils: Pupils are equal, round, and reactive to light.  Neck:     Thyroid: No thyromegaly.  Cardiovascular:     Rate and Rhythm: Normal rate and regular rhythm.     Heart sounds: Normal heart sounds. No murmur heard. Pulmonary:     Effort: Pulmonary effort is normal. No respiratory distress.     Breath sounds: Normal breath sounds. No wheezing or rales.  Abdominal:     General: There is no distension.      Palpations: Abdomen is soft.     Tenderness: There is no abdominal tenderness.  Musculoskeletal:        General: No deformity.     Cervical back: Neck supple.     Right lower leg: No edema.     Left lower leg: No edema.  Lymphadenopathy:     Cervical: No cervical adenopathy.  Skin:    General: Skin is warm and dry.     Findings: No rash.  Neurological:     Mental Status: She is alert and oriented to person, place, and time. Mental status is at baseline.     Gait: Gait normal.  Psychiatric:        Mood and Affect: Mood normal.        Behavior: Behavior normal.        Thought Content: Thought content normal.     Most recent functional status assessment:    11/24/2023    2:04 PM  In your present state of health, do you have any difficulty performing the following activities:  Hearing? 0  Vision? 0  Difficulty concentrating or making decisions? 0  Walking or climbing stairs? 1  Dressing or bathing? 0  Doing errands, shopping? 0  Preparing Food and eating ? N  Using the Toilet? N  In the past six months, have you accidently leaked urine? N  Do you have problems with loss of bowel control? N  Managing your Medications? N  Managing your Finances? N  Housekeeping or managing your Housekeeping? N   Most recent fall risk assessment:    12/23/2022   10:42 AM  Fall Risk   Falls in the past year? 1  Number falls in past yr: 0  Injury with Fall? 1    Most recent depression screenings:    07/28/2023    2:01 PM 12/23/2022   10:42 AM  PHQ 2/9 Scores  PHQ - 2 Score 0 0  PHQ- 9 Score 5 3   Most recent cognitive screening:    11/24/2023    2:10 PM  6CIT Screen  What Year? 0 points  What month? 0 points  What time? 0 points  Count back from 20 0 points  Months in reverse 0 points  Repeat phrase 0 points  Total Score 0 points   Most recent Audit-C alcohol use screening    11/24/2023    2:07 PM  Alcohol Use Disorder Test (AUDIT)  1. How often do you have a drink  containing alcohol? 3  2. How many drinks containing alcohol do you have on a typical day when you are drinking? 0  3. How often do you have six or more drinks on one occasion? 0  AUDIT-C Score 3   A score of 3  or more in women, and 4 or more in men indicates increased risk for alcohol abuse, EXCEPT if all of the points are from question 1   No results found.  No results found for any visits on 11/24/23.  Assessment & Plan     Annual wellness visit done today including the all of the following: Reviewed patient's Family Medical History Reviewed and updated list of patient's medical providers Assessment of cognitive impairment was done Assessed patient's functional ability Established a written schedule for health screening services Health Risk Assessent Completed and Reviewed  Exercise Activities and Dietary recommendations  Goals      DIET - INCREASE WATER INTAKE     Recommend increasing water intake to 3 glasses a day.      Prevent falls     Recommend to remove any items from the home that may cause slips or trips.        Immunization History  Administered Date(s) Administered   Fluad Quad(high Dose 65+) 11/08/2018, 11/17/2019, 11/15/2020, 12/19/2021   Fluad Trivalent(High Dose 65+) 12/23/2022   INFLUENZA, HIGH DOSE SEASONAL PF 10/31/2016, 11/11/2017   PFIZER(Purple Top)SARS-COV-2 Vaccination 03/25/2019, 04/15/2019, 02/29/2020   Pneumococcal Conjugate-13 11/22/2014   Pneumococcal Polysaccharide-23 01/19/2017   Respiratory Syncytial Virus Vaccine,Recomb Aduvanted(Arexvy) 01/14/2022   Td 01/19/2017   Zoster Recombinant(Shingrix) 12/10/2018, 05/13/2019    Health Maintenance  Topic Date Due   Influenza Vaccine  10/09/2023   COVID-19 Vaccine (4 - 2025-26 season) 11/09/2023   Medicare Annual Wellness (AWV)  11/23/2024   DTaP/Tdap/Td (2 - Tdap) 01/20/2027   Pneumococcal Vaccine: 50+ Years  Completed   DEXA SCAN  Completed   Zoster Vaccines- Shingrix  Completed   HPV  VACCINES  Aged Out   Meningococcal B Vaccine  Aged Out     Discussed health benefits of physical activity, and encouraged her to engage in regular exercise appropriate for her age and condition.    Problem List Items Addressed This Visit       Cardiovascular and Mediastinum   Essential hypertension   Blood pressure was elevated, likely due to stress from a recent interaction with her bank. She is on hydrochlorothiazide  and losartan  and does not regularly monitor her blood pressure at home unless symptomatic. - Recheck blood pressure during the visit. - Continue hydrochlorothiazide  25 mg and losartan  50 mg daily. - Encourage home blood pressure monitoring if symptomatic.      Senile purpura (HCC)   Stable Continue to monitor      Hot flashes     Nervous and Auditory   Sensory motor neuropathy   Reports numbness and occasional discoloration in the lower extremities, particularly the legs. Neuropathy is not currently painful but could progress to pain. Aware of potential treatments like gabapentin if pain develops. Compression stockings are not indicated as there is no swelling. - Monitor for progression to painful neuropathy. - Consider gabapentin if neuropathy becomes painful.        Other   History of breast cancer   Continue annual mammos Would not recommend HRT - discussed in depth with patient      Avitaminosis D   Relevant Orders   VITAMIN D  25 Hydroxy (Vit-D Deficiency, Fractures)   Hyperlipidemia   Relevant Orders   Comprehensive metabolic panel with GFR   Lipid panel   History of recurrent UTIs   Other Visit Diagnoses       Encounter for annual wellness visit (AWV) in Medicare patient    -  Primary  Immunization due       Relevant Orders   Flu vaccine HIGH DOSE PF(Fluzone Trivalent)     Hyponatremia       Relevant Orders   Comprehensive metabolic panel with GFR           Postmenopausal atrophic vaginitis and recurrent urinary tract  infections Vaginal estradiol  cream has improved sleep and reduced hot flashes, effectively addressing local symptoms such as dryness and recurrent UTIs. Oral estrogen was considered for systemic benefits, but the risks, including increased risk of breast cancer, stroke, and blood clots, outweigh the benefits at her age. She prioritizes quality of life over potential risks. Currently on nitrofurantoin  for UTI prevention, with plans to discontinue after three months of vaginal estrogen use if UTIs do not recur. - Continue vaginal estradiol  cream with increased dosage as per prescription. - Consider discontinuing nitrofurantoin  after three months of vaginal estrogen use if UTIs do not recur. - Monitor for UTI symptoms and resume nitrofurantoin  if symptoms return.  Insomnia Reports improved sleep with vaginal estrogen but continues to use sleep medication. Not ready to discontinue sleep medication until more certain of the estrogen's effects. - Continue current sleep medication. - Reassess sleep quality and medication use at follow-up.       Return in about 6 months (around 05/23/2024) for chronic disease f/u.     Jon Eva, MD  [facility] [phone] (phone) [phone] (fax)  [facility]

## 2023-11-24 NOTE — Assessment & Plan Note (Signed)
 Blood pressure was elevated, likely due to stress from a recent interaction with her bank. She is on hydrochlorothiazide  and losartan  and does not regularly monitor her blood pressure at home unless symptomatic. - Recheck blood pressure during the visit. - Continue hydrochlorothiazide  25 mg and losartan  50 mg daily. - Encourage home blood pressure monitoring if symptomatic.

## 2023-11-24 NOTE — Assessment & Plan Note (Signed)
 Reports numbness and occasional discoloration in the lower extremities, particularly the legs. Neuropathy is not currently painful but could progress to pain. Aware of potential treatments like gabapentin if pain develops. Compression stockings are not indicated as there is no swelling. - Monitor for progression to painful neuropathy. - Consider gabapentin if neuropathy becomes painful.

## 2023-11-24 NOTE — Assessment & Plan Note (Signed)
Continue annual mammos Would not recommend HRT - discussed in depth with patient

## 2023-11-24 NOTE — Addendum Note (Signed)
 Addended by: LILIAN SEVERO RAMAN on: 11/24/2023 05:27 PM   Modules accepted: Orders

## 2023-11-24 NOTE — Assessment & Plan Note (Signed)
 Stable.       - Continue to monitor

## 2023-11-25 ENCOUNTER — Ambulatory Visit: Payer: Self-pay | Admitting: Family Medicine

## 2023-11-25 DIAGNOSIS — I10 Essential (primary) hypertension: Secondary | ICD-10-CM

## 2023-11-25 LAB — VITAMIN D 25 HYDROXY (VIT D DEFICIENCY, FRACTURES): Vit D, 25-Hydroxy: 58.9 ng/mL (ref 30.0–100.0)

## 2023-11-25 LAB — LIPID PANEL
Chol/HDL Ratio: 2.7 ratio (ref 0.0–4.4)
Cholesterol, Total: 291 mg/dL — ABNORMAL HIGH (ref 100–199)
HDL: 109 mg/dL (ref >39)
LDL Chol Calc (NIH): 168 mg/dL — ABNORMAL HIGH (ref 0–99)
Triglycerides: 87 mg/dL (ref 0–149)
VLDL Cholesterol Cal: 14 mg/dL (ref 5–40)

## 2023-11-25 LAB — COMPREHENSIVE METABOLIC PANEL WITH GFR
ALT: 15 IU/L (ref 0–32)
AST: 27 IU/L (ref 0–40)
Albumin: 4.5 g/dL (ref 3.7–4.7)
Alkaline Phosphatase: 93 IU/L (ref 48–129)
BUN/Creatinine Ratio: 16 (ref 12–28)
BUN: 10 mg/dL (ref 8–27)
Bilirubin Total: 0.4 mg/dL (ref 0.0–1.2)
CO2: 22 mmol/L (ref 20–29)
Calcium: 10 mg/dL (ref 8.7–10.3)
Chloride: 90 mmol/L — ABNORMAL LOW (ref 96–106)
Creatinine, Ser: 0.63 mg/dL (ref 0.57–1.00)
Globulin, Total: 2.7 g/dL (ref 1.5–4.5)
Glucose: 91 mg/dL (ref 70–99)
Potassium: 4.1 mmol/L (ref 3.5–5.2)
Sodium: 129 mmol/L — ABNORMAL LOW (ref 134–144)
Total Protein: 7.2 g/dL (ref 6.0–8.5)
eGFR: 86 mL/min/1.73 (ref >59)

## 2023-11-26 ENCOUNTER — Ambulatory Visit: Admitting: Physical Therapy

## 2023-11-26 DIAGNOSIS — R2681 Unsteadiness on feet: Secondary | ICD-10-CM | POA: Diagnosis not present

## 2023-11-26 DIAGNOSIS — R269 Unspecified abnormalities of gait and mobility: Secondary | ICD-10-CM

## 2023-11-26 DIAGNOSIS — R531 Weakness: Secondary | ICD-10-CM | POA: Diagnosis not present

## 2023-11-26 NOTE — Therapy (Signed)
 OUTPATIENT PHYSICAL THERAPY NEURO TREATMENT   Patient Name: Brandy Wong MRN: [id number] DOB:1934/12/08, 89 y.o., female Today's Date: 11/26/2023   PCP: Myrla Jon HERO, MD REFERRING PROVIDER: Maree Jannett POUR, MD   END OF SESSION:      PT End of Session - 11/26/23 1107     Visit Number 17    Number of Visits 24    Date for Recertification  12/08/23    PT Start Time 1107    PT Stop Time 1145    PT Time Calculation (min) 38 min    Equipment Utilized During Treatment Gait belt    Activity Tolerance Patient tolerated treatment well    Behavior During Therapy WFL for tasks assessed/performed           Past Medical History:  Diagnosis Date   Arthritis    Cancer (HCC) 2013   R breast, s/p radiation and lumpectomy   Cataract    R eye   Chronic cystitis    Female bladder prolapse    Frequent urination    GERD (gastroesophageal reflux disease)    Hypertension    Insomnia    Melanoma (HCC)    left upper limb   Squamous cell carcinoma in situ    Past Surgical History:  Procedure Laterality Date   APPENDECTOMY     BREAST EXCISIONAL BIOPSY Left    benign   BREAST EXCISIONAL BIOPSY Left    benign   BREAST SURGERY Right 2013   lumpectomy post positive breast ca, radiation   CATARACT EXTRACTION     LAPAROSCOPIC VAGINAL HYSTERECTOMY WITH SALPINGO OOPHORECTOMY     TUBAL LIGATION     Patient Active Problem List   Diagnosis Date Noted   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   Hot flashes 08/04/2023   Hyperlipidemia 06/23/2023   History of recurrent UTIs 06/23/2023   Chronic cystitis 06/23/2023   Melanoma of skin (HCC) 06/23/2023   Osteoporosis 06/23/2022   Syncope 06/23/2022   Senile purpura (HCC) 12/19/2021   Vasomotor symptoms due to menopause 07/29/2021   Chronic cough 11/15/2020   Non-healing wound of lower extremity 11/15/2020   Synovial cyst of hand 11/15/2020   Insomnia 11/08/2018   Balance problem 11/08/2018   GERD (gastroesophageal reflux disease)  10/31/2016   Essential hypertension 10/31/2016   Avitaminosis D 10/31/2016   History of uterine prolapse 05/19/2013   History of breast cancer 11/05/2012   SUI (stress urinary incontinence, female) 03/18/2012   Pelvic relaxation due to enterocele, vaginal 12/16/2011    ONSET DATE: ~1-2 years ago  REFERRING DIAG: G62.9 (ICD-10-CM) - Polyneuropathy, unspecified   THERAPY DIAG:     Unsteadiness on feet  Abnormality of gait  Generalized weakness  Rationale for Evaluation and Treatment: Rehabilitation  SUBJECTIVE:  SUBJECTIVE STATEMENT:    Pt states that she is doing okay, has a little bit of back soreness that she thinks may be related to how she slept. Pt reports that she has been doing all of her exercises & feels ready for discharge at the end of the month.     FROM EVAL: Pt states MD believes her symptoms are a result of post-polio. Pt states when she had polio at 14 or 89y.o., she went to rehab for 4 months, and came home and was completely independent without aids/braces. Pt states she golfed for ~50years to stay active, reports she stopped playing ~3 years ago due to concern of falling on uneven/compliant surface of grass. Pt states now she walks her dog 2x/day for exercise. Pt states when she starts the walk, she is doing good, but then she quickly notices fatigue in her B LE muscles and worsening of her balance. Pt states she has recently been diagnosed with polyneuropathy.   Pt states balance and B LE lower leg weakness (below her knees) are her greatest deficits.   Reports she also does yoga and tried to perform tree pose, standing on 1 foot, which made her realize her balance deficits.   Pt states she performs the below exercises at least every other day:  Single leg stance Yoga stretches  in standing and sitting  Sun Pose Childs pose Forward and lateral foot taps Stopped doing deep knee bends due to pain, and MD recommended she discontinue this exercise Supine bicycle ab exercises Standing heel raises Seated piriformis/trunk rotation stretch Quadruped bird dogs Prone Superman  Pt states she gets on/off the floor to do her exercises and is able to do this successfully without support from a chair/counter.  Pt reports she has noticed she is weak when going to step-up onto the 1 step to enter/exit her back porch.  Pt reports increased postural sway during gait and will tend to lean towards the side her purse is on her shoulder.   Pt states she also doesn't sleep well, which impacts what she feels up to doing each day.   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 Severe sensory motor polyneuropathy confirmed by nerve conduction study. Sensory neuropathy contributes to balance issues due to impaired pressure sensation in feet, affecting her ability to make micro adjustments for balance. Lack of vibration sensation in feet noted, affecting balance.  Chronic denervation in limbs confirmed by EMG (05/2023). Polio diagnosed in the 1950s, likely contributing to current weakness and fatigue. Symptoms include leg weakness, fatigue, and balance issues. Post-polio syndrome contributes to muscle weakness, affecting her ability to prevent falls. Emphasized the need for progressive muscle loading and avoiding overexertion.   PAIN:  Are you having pain? No, but pt does states she will sometimes have back pain if she stands for too long (especially when working in her kitchen)  PRECAUTIONS: Fall  RED FLAGS: None   WEIGHT BEARING  RESTRICTIONS: No  FALLS: Has patient fallen in last 6 months? No, but pt states sometimes she stumbles when getting out of the bed too quickly to get to the bathroom  LIVING ENVIRONMENT: Lives with: lives alone Lives in: Other 1 story townhouse Stairs: enters through garage mostly, which has ramped entrance; but does have 1 step to enter/exit front and  back doors of home Has following equipment at home: Single point cane, Grab bars, and built in shower seat  PLOF: Independent, but has recently hired support to help with household tasks  PATIENT GOALS: Improve balance and strength legs  OBJECTIVE:  Note: Objective measures were completed at Evaluation unless otherwise noted.  DIAGNOSTIC FINDINGS:  Per MD note on 07/14/2023 NCS/EMG lower extremity (05/12/23) Impression: This is an abnormal electrodiagnostic study consistent with 1) generalized severe sensorimotor polyneuropathy. 2) EMG changes in both lower extremities shows signs of chronic denervation.  COGNITION: Overall cognitive status: Within functional limits for tasks assessed   SENSATION: Able to sense touch on screen, but pt reports LEs feel numb in some places and had NCS/EMG nerve conduction showing decreased sensation MD note stating absent vibratory sense  COORDINATION: WFL, abnormal movement patterns due to weakness, but not cerebellar incoordination  EDEMA:  Not formally assessed, none observed  MUSCLE TONE: Not formally assessed  MUSCLE LENGTH: Not formally assessed  DTRs:  Not formally assessed  POSTURE: rounded shoulders, forward head, and posterior pelvic tilt  LOWER EXTREMITY ROM:     Active   WFL in B LEs for functional mobility tasks  Right Eval Left Eval  Hip flexion    Hip extension    Hip abduction    Hip adduction    Hip internal rotation    Hip external rotation    Knee flexion    Knee extension    Ankle dorsiflexion    Ankle plantarflexion    Ankle inversion    Ankle  eversion     (Blank rows = not tested)  LOWER EXTREMITY MMT:    MMT Right Eval Left Eval  Hip flexion 3+ 3+  Hip extension    Hip abduction    Hip adduction    Hip internal rotation    Hip external rotation    Knee flexion 4- 4  Knee extension 4 4  Ankle dorsiflexion 3+ 3+  Ankle plantarflexion 4- 4-  Ankle inversion    Ankle eversion    (Blank rows = not tested)  Manual Muscle Test Scale 0/5 = No muscle contraction can be seen or felt 1/5 = Contraction can be felt, but there is no motion 2-/5 = Part moves through incomplete ROM w/ gravity decreased 2/5 = Part moves through complete ROM w/ gravity decreased 2+/5 = Part moves through incomplete ROM (<50%) against gravity or through complete ROM w/ gravity 3-/5 = Part moves through incomplete ROM (>50%) against gravity 3/5 = Part moves through complete ROM against gravity 3+/5 = Part moves through complete ROM against gravity/slight resistance 4-/5= Holds test position against slight to moderate pressure 4/5 = Part moves through complete ROM against gravity/moderate resistance 4+/5= Holds test position against moderate to strong pressure 5/5 = Part moves through complete ROM against gravity/full resistance  BED MOBILITY:  Not tested  TRANSFERS: Sit to stand: Complete Independence and Modified independence  Assistive device utilized: None     Stand to sit: Complete Independence and Modified independence  Assistive device utilized: None     Chair to chair: Complete Independence and Modified independence  Assistive device utilized: None       RAMP:  Not tested  CURB:  Not tested  STAIRS: Findings: Level of Assistance: CGA, Stair Negotiation Technique: Alternating Pattern  with Bilateral Rails, Number of Stairs: 4, Height of Stairs: 6in   , and Comments: decreased ability to power up during ascent and decreased eccentric control on descent with  pt relying on B UE support on the railings GAIT: Findings: Gait  Characteristics: step through pattern, decreased step length- Right, decreased step length- Left, decreased hip/knee flexion- Right, decreased hip/knee flexion- Left, decreased trunk rotation, and wide BOS, Distance walked: ~147ft, Assistive device utilized:None, Level of assistance: SBA, and Comments: increased arm swing bilaterally, lack of trunk rotation, lack of full hip/knee flexion during swing with partial stiff legged gait pattern., slightly wider BOS    FUNCTIONAL TESTS:  5 times sit to stand: 9.77 seconds from green chair, no UE support 6 minute walk test: 915 ft on 7/10 with no AD 10 meter walk test:  0.86 m/s, no AD Berg Balance Scale: 45/56 on 7/10 Functional gait assessment: 16/30 on 7/10   PATIENT SURVEYS:  ABC scale: The Activities-Specific Balance Confidence (ABC) Scale 0% 10 20 30  40 50 60 70 80 90 100% No confidence<->completely confident  "How confident are you that you will not lose your balance or become unsteady when you . . .   Date tested 09/15/2023- corrected on 7/10 per pt request 10/21/23:  Walk around the house 80% 90%  2. Walk up or down stairs 30% 20%  3. Bend over and pick up a slipper from in front of a closet floor 90% 100%  4. Reach for a small can off a shelf at eye level 90% 100%  5. Stand on tip toes and reach for something above your head 80% 50%  6. Stand on a chair and reach for something 0% 20%  7. Sweep the floor 90% 90%  8. Walk outside the house to a car parked in the driveway 50% 100%  9. Get into or out of a car 90% 100%  10. Walk across a parking lot to the mall 90% 50%  11. Walk up or down a ramp 100% 60%  12. Walk in a crowded mall where people rapidly walk past you 60% 90%  13. Are bumped into by people as you walk through the mall 50% 70%  14. Step onto or off of an escalator while you are holding onto the railing 20% 60%  15. Step onto or off an escalator while holding onto parcels such that you cannot hold onto the railing 0% 20%   16. Walk outside on icy sidewalks 0% 10%  Total: #/16 57.5% 64.4%                                                                                                                                 TREATMENT DATE: 11/26/2023  Unless otherwise noted, gait belt donned & CGA provided.   Gait w/ 5# AWs ~500'   Resisted stepping against green theraband with foot taps at hedgehogs, 20x Pt with decreased eccentric control when stepping back, increased difficulty with stepping against resistance that improved with increased repetitions  Educated pt on purpose of exercise  STS from green chair 10x with body weight, hands crossed at  chest  Verbal cueing for improved eccentric control Progressed to adding holding 5# dumbbell at chest, 10x; no report of increased knee pain  Airex pad; all activities done with hands hovering at support bar 2x 30 sec feet together eyes open  Minor lateral sway noted; not requiring UE support for balance recovery 2x 30 sec feet together eyes closed Increased lateral sway noted; intermittently requiring UE support for balance recovery, not requiring therapist assist for recovery 3/4 tandem stance on level ground with hands hovering at support bar, x30 sec Minor lateral sway noted; not requiring UE support for balance recovery Trial of full tandem stance, 30 sec.  Increased sway noted; intermittently requiring UE support for balance recovery, not requiring therapist assist for recovery Pt educated on when to progress to full tandem w/ HEP   Pt educated to continue w/ HEP as usual w/ no changes this date.      PATIENT EDUCATION: Education details: Pt educated throughout session about proper posture and technique with exercises. Improved exercise technique, movement at target joints, use of target muscles after min to mod verbal, visual, tactile cues.   Person educated: Patient Education method: Explanation and Handouts Education comprehension: verbalized  understanding and needs further education  HOME EXERCISE PROGRAM: Access Code: [email or web address] URL: [email or web address] Date: 11/04/2023 Prepared by: Chiquita Silvan  Exercises - Seated Ankle Dorsiflexion with Anchored Resistance  - 1 x daily - 7 x weekly - 2 sets - 10 reps - Standing March with Counter Support  - 1 x daily - 7 x weekly - 2 sets - 10 reps - Standing Hip Abduction with Resistance at Ankles and Counter Support  - 1 x daily - 3 x weekly - 2 sets - 10 reps - Lateral Foot taps with Counter Support  - 1 x daily - 7 x weekly - 2 sets - 10 reps - Standing Romberg to 3/4 Tandem Stance  - 1 x daily - 7 x weekly - 2 sets - 30 seconds hold - Sit to Stand with Arms Crossed  - 2 x weekly - 2 sets - 10 reps   GOALS: Goals reviewed with patient? Yes  SHORT TERM GOALS: Target date: 10/27/2023  Patient will be independent with home exercise program to improve strength/mobility for better functional independence with ADLs and community level activities.  Baseline: initiated on 09/15/2023 Goal status: INITIAL  LONG TERM GOALS: Target date: 12/08/2023   Patient will increase Berg Balance score to > 51/56 to demonstrate improved balance and decreased fall risk during functional activities and ADLs.  Baseline: 45/56 on 7/10 10/21/23: 47/56 Goal status: INITIAL  2.  Patient will increase six minute walk test distance to >10100ft for progression to community level ambulation, demonstrating improved gait endurance.  Baseline: 915 ft on 7/10 with no AD 10/21/23: 1074' with no AD Goal status: INITIAL  3.  Patient will increase 10 meter walk test to >1.74m/s as to improve gait speed for better community ambulation and to reduce fall risk. Baseline: 0.86 m/s 10/21/23: 0.95 m/s normal speed; 1.2 m/s at fast speed Goal status: INITIAL  4.  Patient will improve ABC scale score >80% to demonstrate increased confidence with functional mobility and ADLs. Baseline: 57.5% corrected on  7/10 10/21/23: 64.4%  Goal status: INITIAL  5.  Patient will increase Functional Gait Assessment (FGA) score to >20/30 as to reduce fall risk and improve dynamic gait safety with community ambulation.  Baseline: 16/30 on 7/10 10/21/23: 17/30 Goal status: INITIAL   ASSESSMENT:  CLINICAL  IMPRESSION:     Pt performed well with the tasks given and balance was challenged with the use of resisted stepping with foot taps, as well as standing activities on airex pad. Pt continues to demonstrate increased difficulty with standing with narrow BOS and eyes closed compared to narrow BOS with eyes open while on airex pad. Pt demonstrating decreased eccentric control with both resisted stepping with foot taps, as well as with sit to stands. Pt anticipating d/c at the end of the month. Pt will continue to benefit from skilled therapy to address remaining deficits in order to improve overall QoL and return to PLOF.       OBJECTIVE IMPAIRMENTS: Abnormal gait, decreased activity tolerance, decreased balance, decreased endurance, decreased mobility, difficulty walking, decreased strength, impaired sensation, and pain.   ACTIVITY LIMITATIONS: carrying, lifting, bending, standing, squatting, stairs, transfers, bathing, dressing, reach over head, and locomotion level  PARTICIPATION LIMITATIONS: meal prep, cleaning, laundry, shopping, community activity, and yard work  PERSONAL FACTORS: Age, Fitness, Time since onset of injury/illness/exacerbation, and 3+ comorbidities: Post-Polio Syndrome, Osteoporosis, Arthritis, GERD, HTN, Insomnia, and Melanoma are also affecting patient's functional outcome.   REHAB POTENTIAL: Good  CLINICAL DECISION MAKING: Evolving/moderate complexity  EVALUATION COMPLEXITY: Moderate  PLAN:  PT FREQUENCY: 1-2x/week   PT DURATION: 12 weeks  PLANNED INTERVENTIONS: 97164- PT Re-evaluation, 97750- Physical Performance Testing, 97110-Therapeutic exercises, 97530- Therapeutic  activity, 97112- Neuromuscular re-education, 97535- Self Care, [id number]- Manual therapy, [phone]- Gait training, [phone]- Orthotic Initial, [phone]- Orthotic/Prosthetic subsequent, [phone]- Canalith repositioning, [phone]- Electrical stimulation (manual), Patient/Family education, Balance training, Stair training, Joint mobilization, Vestibular training, DME instructions, Cryotherapy, Moist heat, and Biofeedback  PLAN FOR NEXT SESSION:   D/C: reassess LTG's   Chiquita Silvan, SPT Physical Therapy Student - [place]  [facility]  11/26/23, 1:04 PM

## 2023-12-01 ENCOUNTER — Ambulatory Visit: Admitting: Physical Therapy

## 2023-12-02 DIAGNOSIS — D485 Neoplasm of uncertain behavior of skin: Secondary | ICD-10-CM | POA: Diagnosis not present

## 2023-12-02 DIAGNOSIS — L570 Actinic keratosis: Secondary | ICD-10-CM | POA: Diagnosis not present

## 2023-12-02 DIAGNOSIS — L820 Inflamed seborrheic keratosis: Secondary | ICD-10-CM | POA: Diagnosis not present

## 2023-12-02 DIAGNOSIS — D044 Carcinoma in situ of skin of scalp and neck: Secondary | ICD-10-CM | POA: Diagnosis not present

## 2023-12-03 ENCOUNTER — Ambulatory Visit: Admitting: Physical Therapy

## 2023-12-04 ENCOUNTER — Ambulatory Visit: Admitting: Physical Therapy

## 2023-12-08 ENCOUNTER — Ambulatory Visit: Admitting: Physical Therapy

## 2023-12-08 MED ORDER — LOSARTAN POTASSIUM 100 MG PO TABS: 100.0000 mg | ORAL_TABLET | Freq: Every day | ORAL | 0 refills | Status: DC

## 2023-12-09 ENCOUNTER — Ambulatory Visit: Admitting: Physical Therapy

## 2023-12-11 ENCOUNTER — Encounter: Payer: Self-pay | Admitting: Nurse Practitioner

## 2023-12-11 ENCOUNTER — Ambulatory Visit: Admitting: Nurse Practitioner

## 2023-12-11 ENCOUNTER — Ambulatory Visit: Payer: Self-pay

## 2023-12-11 ENCOUNTER — Ambulatory Visit: Admitting: Physical Therapy

## 2023-12-11 ENCOUNTER — Telehealth: Payer: Self-pay

## 2023-12-11 VITALS — BP 130/82 | HR 84 | Temp 98.0°F | Ht 61.0 in | Wt 118.8 lb

## 2023-12-11 DIAGNOSIS — R350 Frequency of micturition: Secondary | ICD-10-CM

## 2023-12-11 DIAGNOSIS — R3915 Urgency of urination: Secondary | ICD-10-CM | POA: Diagnosis not present

## 2023-12-11 DIAGNOSIS — N3001 Acute cystitis with hematuria: Secondary | ICD-10-CM

## 2023-12-11 LAB — POC URINALSYSI DIPSTICK (AUTOMATED)
Bilirubin, UA: NEGATIVE
Glucose, UA: NEGATIVE
Nitrite, UA: POSITIVE
Protein, UA: NEGATIVE
Spec Grav, UA: 1.015 (ref 1.010–1.025)
Urobilinogen, UA: 0.2 U/dL
pH, UA: 7 (ref 5.0–8.0)

## 2023-12-11 MED ORDER — CEPHALEXIN 500 MG PO CAPS: 500.0000 mg | ORAL_CAPSULE | Freq: Two times a day (BID) | ORAL | 0 refills | Status: AC

## 2023-12-11 NOTE — Progress Notes (Signed)
 Established Patient Office Visit  Subjective:  Patient ID: Brandy Wong, female    DOB: Dec 23, 1934  Age: 89 y.o. MRN: [id number]  CC:  Chief Complaint  Patient presents with   Acute Visit    Urine frequency & urgency   Discussed the use of a AI scribe software for clinical note transcription with the patient, who gave verbal consent to proceed.  HPI  Brandy Wong is an 89 year old female with a history of recurrent urinary tract infections who presents with symptoms suggestive of a urinary tract infection.  She experiences urinary urgency and frequency since Tuesday, with a sensation of pressure. She sometimes has hesitancy, feeling the urge but unable to urinate. There is no pain or hematuria.  HPI   Past Medical History:  Diagnosis Date   Arthritis    Cancer [facility]) 2013   R breast, s/p radiation and lumpectomy   Cataract    R eye   Chronic cystitis    Female bladder prolapse    Frequent urination    GERD (gastroesophageal reflux disease)    Hypertension    Insomnia    Melanoma (HCC)    left upper limb   Squamous cell carcinoma in situ     Past Surgical History:  Procedure Laterality Date   APPENDECTOMY     BREAST EXCISIONAL BIOPSY Left    benign   BREAST EXCISIONAL BIOPSY Left    benign   BREAST SURGERY Right 2013   lumpectomy post positive breast ca, radiation   CATARACT EXTRACTION     LAPAROSCOPIC VAGINAL HYSTERECTOMY WITH SALPINGO OOPHORECTOMY     TUBAL LIGATION      Family History  Problem Relation Age of Onset   GER disease Mother    Dementia Mother 15   Stroke Father    Heart attack Father    Colon cancer Neg Hx    Rectal cancer Neg Hx    Stomach cancer Neg Hx    Esophageal cancer Neg Hx     Social History   Socioeconomic History   Marital status: Widowed    Spouse name: Not on file   Number of children: 2   Years of education: some college   Highest education level: Some college, no degree  Occupational History    Employer: RETIRED   Tobacco Use   Smoking status: Former    Current packs/day: 0.00    Average packs/day: 0.2 packs/day for 10.0 years (1.5 ttl pk-yrs)    Types: Cigarettes    Start date: 03/09/1977    Quit date: 03/10/1987    Years since quitting: 36.7    Passive exposure: Past   Smokeless tobacco: Never  Vaping Use   Vaping status: Never Used  Substance and Sexual Activity   Alcohol use: Yes    Alcohol/week: 7.0 standard drinks of alcohol    Types: 7 Glasses of wine per week    Comment: 1 glass a day   Drug use: No   Sexual activity: Never  Other Topics Concern   Not on file  Social History Narrative   Not on file   Social Drivers of Health   Financial Resource Strain: Low Risk  (04/13/2023)   Received from [facility]   Overall Financial Resource Strain (CARDIA)    Difficulty of Paying Living Expenses: Not hard at all  Food Insecurity: No Food Insecurity (04/13/2023)   Received from [facility]   Hunger Vital Sign    Within  the past 12 months, you worried that your food would run out before you got the money to buy more.: Never true    Within the past 12 months, the food you bought just didn't last and you didn't have money to get more.: Never true  Transportation Needs: No Transportation Needs (04/13/2023)   Received from [facility] - Transportation    In the past 12 months, has lack of transportation kept you from medical appointments or from getting medications?: No    Lack of Transportation (Non-Medical): No  Physical Activity: Insufficiently Active (11/24/2023)   Exercise Vital Sign    Days of Exercise per Week: 3 days    Minutes of Exercise per Session: 30 min  Stress: No Stress Concern Present (11/24/2023)   [employer] of Occupational Health - Occupational Stress Questionnaire    Feeling of Stress: Not at all  Social Connections: Moderately Isolated (11/24/2023)   Social Connection and Isolation Panel    Frequency  of Communication with Friends and Family: More than three times a week    Frequency of Social Gatherings with Friends and Family: Three times a week    Attends Religious Services: Never    Active Member of Clubs or Organizations: Yes    Attends Banker Meetings: Never    Marital Status: Widowed  Intimate Partner Violence: Not At Risk (11/18/2022)   Humiliation, Afraid, Rape, and Kick questionnaire    Fear of Current or Ex-Partner: No    Emotionally Abused: No    Physically Abused: No    Sexually Abused: No     Outpatient Medications Prior to Visit  Medication Sig Dispense Refill   BIOTIN PO Take by mouth.     Cholecalciferol 25 MCG (1000 UT) tablet Take 1 tablet by mouth daily.     estradiol  (ESTRACE  VAGINAL) 0.1 MG/GM vaginal cream Place 1 Applicatorful vaginally 3 (three) times a week. 42.5 g 12   hydrochlorothiazide  (HYDRODIURIL ) 25 MG tablet Take 1 tablet (25 mg total) by mouth daily. 90 tablet 3   losartan  (COZAAR ) 100 MG tablet Take 1 tablet (100 mg total) by mouth daily. 30 tablet 0   MAGNESIUM PO Take 1 capsule by mouth daily. 500mg      nitrofurantoin  (MACRODANTIN ) 100 MG capsule Take 1 capsule (100 mg total) by mouth daily. 90 capsule 3   omeprazole  (PRILOSEC) 40 MG capsule TAKE 1 CAPSULE (40 MG TOTAL) BY MOUTH DAILY. 90 capsule 4   Polyethyl Glycol-Propyl Glycol (SYSTANE OP) Apply 1 drop to eye 4 (four) times daily as needed.     TURMERIC PO Take 1 tablet by mouth daily. 500mg      No facility-administered medications prior to visit.    Allergies  Allergen Reactions   Amoxicillin Rash    ROS Review of Systems  Genitourinary:  Positive for frequency and urgency. Negative for hematuria.   Negative unless indicated in HPI.    Objective:    Physical Exam Constitutional:      Appearance: Normal appearance.  Cardiovascular:     Rate and Rhythm: Normal rate and regular rhythm.     Pulses: Normal pulses.     Heart sounds: Normal heart sounds.   Abdominal:     General: Bowel sounds are normal.     Palpations: Abdomen is soft.     Tenderness: There is no abdominal tenderness. There is no right CVA tenderness or left CVA tenderness.  Musculoskeletal:     Cervical back: Normal range of  motion.  Neurological:     General: No focal deficit present.     Mental Status: She is alert. Mental status is at baseline.  Psychiatric:        Mood and Affect: Mood normal.        Behavior: Behavior normal.        Thought Content: Thought content normal.        Judgment: Judgment normal.     BP 130/82   Pulse 84   Temp 98 F (36.7 C)   Ht 5' 1 (1.549 m)   Wt 118 lb 12.8 oz (53.9 kg)   SpO2 91%   BMI 22.45 kg/m  Wt Readings from Last 3 Encounters:  12/11/23 118 lb 12.8 oz (53.9 kg)  11/24/23 118 lb (53.5 kg)  09/10/23 120 lb 6.4 oz (54.6 kg)     Health Maintenance  Topic Date Due   Mammogram  01/01/2024   COVID-19 Vaccine (4 - 2025-26 season) 12/27/2023 (Originally 11/09/2023)   Medicare Annual Wellness (AWV)  11/23/2024   DTaP/Tdap/Td (2 - Tdap) 01/20/2027   Pneumococcal Vaccine: 50+ Years  Completed   Influenza Vaccine  Completed   DEXA SCAN  Completed   Zoster Vaccines- Shingrix  Completed   Meningococcal B Vaccine  Aged Out    There are no preventive care reminders to display for this patient.  No results found for: TSH Lab Results  Component Value Date   WBC 6.2 11/08/2019   HGB 14.5 11/08/2019   HCT 41.8 11/08/2019   MCV 94 11/08/2019   PLT 388 11/08/2019   Lab Results  Component Value Date   NA 129 (L) 11/24/2023   K 4.1 11/24/2023   CO2 22 11/24/2023   GLUCOSE 91 11/24/2023   BUN 10 11/24/2023   CREATININE 0.63 11/24/2023   BILITOT 0.4 11/24/2023   ALKPHOS 93 11/24/2023   AST 27 11/24/2023   ALT 15 11/24/2023   PROT 7.2 11/24/2023   ALBUMIN 4.5 11/24/2023   CALCIUM 10.0 11/24/2023   EGFR 86 11/24/2023   Lab Results  Component Value Date   CHOL 291 (H) 11/24/2023   Lab Results  Component  Value Date   HDL 109 11/24/2023   Lab Results  Component Value Date   LDLCALC 168 (H) 11/24/2023   Lab Results  Component Value Date   TRIG 87 11/24/2023   Lab Results  Component Value Date   CHOLHDL 2.7 11/24/2023   Lab Results  Component Value Date   HGBA1C 5.6 06/23/2023      Assessment & Plan:  Acute cystitis with hematuria Assessment & Plan: Dipstick positive for leukocytes, blood and nitrite. -Urine culture pending. -Given patient's history of UTI and dipstick we will treat with cephalexin  500 mg twice a day for 7 days. -If needed which she will antibiotic depending on culture result. -Advised patient to increase fluid intake.     Urine frequency -     POCT Urinalysis Dipstick (Automated) -     Urinalysis, Routine w reflex microscopic -     Urine Culture  Urgency of urination -     POCT Urinalysis Dipstick (Automated) -     Urinalysis, Routine w reflex microscopic -     Urine Culture  Other orders -     Cephalexin ; Take 1 capsule (500 mg total) by mouth 2 (two) times daily.  Dispense: 14 capsule; Refill: 0 -     MICROSCOPIC MESSAGE    Follow-up: Return if symptoms worsen or fail to improve.  Starlyn Droge, NP

## 2023-12-11 NOTE — Telephone Encounter (Signed)
 FYI Only or Action Required?: FYI only for provider.  Patient was last seen in primary care on 11/24/2023 by Myrla Jon HERO, MD.  Called Nurse Triage reporting urgency, frequency  Symptoms began 3 days ago.  Interventions attempted: Prescription medications: AS prescribed. Macrodantin  and estradiol   Symptoms are: unchanged.  Triage Disposition: See Physician Within 24 Hours  Patient/caregiver understands and will follow disposition?: Yes                  Copied from CRM #[id number]. Topic: Clinical - Red Word Triage >> Dec 11, 2023  8:51 AM Treva T wrote: [facility] that prompted transfer to Nurse Triage: Patient calling, states she thinks she has a UTI, symptoms are frequency, urgency, increased bladder pressure.   Patient reports she called yesterday, and no one called back. Patient states she does not want to prolong treatment and wants advise as soon as possible.   Reports has been taking medication, but not helping. Reason for Disposition  Urinating more frequently than usual (i.e., frequency) OR new-onset of the feeling of an urgent need to urinate (i.e., urgency)  Answer Assessment - Initial Assessment Questions 1. SYMPTOM: What's the main symptom you're concerned about? (e.g., frequency, incontinence)     Frequency, urgency, bladder 2. ONSET: When did the  s/s  start?     3 days 3. PAIN: Is there any pain? If Yes, ask: How bad is it? (Scale: 1-10; mild, moderate, severe)     Yes - mild moderate 4. CAUSE: What do you think is causing the symptoms?     UTI 5. OTHER SYMPTOMS: Do you have any other symptoms? (e.g., blood in urine, fever, flank pain, pain with urination)     Back pain  Protocols used: Urinary Symptoms-A-AH

## 2023-12-11 NOTE — Telephone Encounter (Signed)
 Copied from CRM [id number]. Topic: Clinical - Medical Advice >> Dec 10, 2023  2:41 PM Delon T wrote: Reason for CRM: Possible UTI- frequency and pressure- would like antibiotic- [phone]

## 2023-12-11 NOTE — Telephone Encounter (Signed)
 Pt called in and was advised by E2C2 agent that an appointment is needed for this.

## 2023-12-13 ENCOUNTER — Ambulatory Visit: Payer: Self-pay | Admitting: Nurse Practitioner

## 2023-12-13 DIAGNOSIS — N3001 Acute cystitis with hematuria: Secondary | ICD-10-CM | POA: Insufficient documentation

## 2023-12-13 LAB — URINALYSIS, ROUTINE W REFLEX MICROSCOPIC
Bilirubin Urine: NEGATIVE
Glucose, UA: NEGATIVE
Hyaline Cast: NONE SEEN /LPF
Ketones, ur: NEGATIVE
Nitrite: NEGATIVE
Protein, ur: NEGATIVE
Specific Gravity, Urine: 1.012 (ref 1.001–1.035)
WBC, UA: >=60 /HPF — AB (ref 0–5)
pH: 6.5 (ref 5.0–8.0)

## 2023-12-13 LAB — URINE CULTURE
MICRO NUMBER:: 17053974
SPECIMEN QUALITY:: ADEQUATE

## 2023-12-13 LAB — MICROSCOPIC MESSAGE

## 2023-12-13 NOTE — Assessment & Plan Note (Signed)
 Dipstick positive for leukocytes, blood and nitrite. -Urine culture pending. -Given patient's history of UTI and dipstick we will treat with cephalexin  500 mg twice a day for 7 days. -If needed which she will antibiotic depending on culture result. -Advised patient to increase fluid intake.

## 2023-12-13 NOTE — Progress Notes (Signed)
 Please inform patient culture is positive UTI.  The medication that was started has intermediate sensitivity.  How are her symptoms now?

## 2023-12-15 ENCOUNTER — Ambulatory Visit: Admitting: Physical Therapy

## 2023-12-16 ENCOUNTER — Ambulatory Visit

## 2023-12-18 ENCOUNTER — Ambulatory Visit: Admitting: Physical Therapy

## 2023-12-22 ENCOUNTER — Ambulatory Visit: Admitting: Physical Therapy

## 2023-12-23 ENCOUNTER — Ambulatory Visit

## 2023-12-25 ENCOUNTER — Ambulatory Visit: Admitting: Physical Therapy

## 2023-12-28 ENCOUNTER — Encounter: Payer: Self-pay | Admitting: Family Medicine

## 2023-12-28 ENCOUNTER — Encounter: Admitting: Family Medicine

## 2023-12-28 ENCOUNTER — Ambulatory Visit: Admitting: Family Medicine

## 2023-12-28 ENCOUNTER — Ambulatory Visit: Admitting: Physical Therapy

## 2023-12-28 VITALS — BP 124/83 | HR 88 | Ht 61.0 in | Wt 120.9 lb

## 2023-12-28 DIAGNOSIS — G629 Polyneuropathy, unspecified: Secondary | ICD-10-CM | POA: Diagnosis not present

## 2023-12-28 DIAGNOSIS — N390 Urinary tract infection, site not specified: Secondary | ICD-10-CM

## 2023-12-28 DIAGNOSIS — M940 Chondrocostal junction syndrome [Tietze]: Secondary | ICD-10-CM | POA: Diagnosis not present

## 2023-12-28 DIAGNOSIS — I10 Essential (primary) hypertension: Secondary | ICD-10-CM

## 2023-12-28 DIAGNOSIS — E871 Hypo-osmolality and hyponatremia: Secondary | ICD-10-CM

## 2023-12-28 DIAGNOSIS — K219 Gastro-esophageal reflux disease without esophagitis: Secondary | ICD-10-CM | POA: Diagnosis not present

## 2023-12-28 NOTE — Assessment & Plan Note (Signed)
 Hypertension managed with losartan  50 mg and hydrochlorothiazide  25 mg daily. Recent lab results showed hyponatremia, likely due to hydrochlorothiazide . Hydrochlorothiazide  was discontinued, and losartan  was increased to 100 mg daily. Blood pressure readings at home are satisfactory, with a recent reading of 124/83 mmHg. Current sodium levels are being re-evaluated to assess improvement after medication adjustment. She consumes a high-salt diet, which may affect sodium levels. - Recheck sodium levels today. - Continue losartan  100 mg daily. - Monitor blood pressure at home. - Evaluate sodium levels and adjust treatment if necessary.

## 2023-12-28 NOTE — Assessment & Plan Note (Signed)
 Chronic cough likely due to GERD, which exacerbates costochondritis. Current management includes daily medication for reflux, but symptoms persist intermittently. - Continue current GERD management. - Monitor for changes in symptoms.

## 2023-12-28 NOTE — Progress Notes (Signed)
 Established patient visit   Patient: Brandy Wong   DOB: 10-18-1934   89 y.o. Female  MRN: [id number] Visit Date: 12/28/2023  Today's healthcare provider: Jon Eva, MD   Chief Complaint  Patient presents with   Medical Management of Chronic Issues   Hypertension    Patient was last seen 11/24/23. Advised to d/c hydrochlorothiazide  and increase losartan  to 100 mg due to low sodium. She reports taking taking medication as prescribed and tolerating well. She reports monitoring at home. She reports no symptoms   Subjective    Hypertension   HPI     Hypertension    Additional comments: Patient was last seen 11/24/23. Advised to d/c hydrochlorothiazide  and increase losartan  to 100 mg due to low sodium. She reports taking taking medication as prescribed and tolerating well. She reports monitoring at home. She reports no symptoms      Last edited by Eva Jon HERO, MD on 12/28/2023  1:34 PM.       Discussed the use of AI scribe software for clinical note transcription with the patient, who gave verbal consent to proceed.  History of Present Illness   Brandy Wong is an 89 year old female with hypertension who presents for follow-up of blood pressure management and recent UTI.  Her blood pressure was elevated during her last visit, attributed to stress. She was on losartan  50 mg and hydrochlorothiazide  25 mg daily. Due to hyponatremia, hydrochlorothiazide  was discontinued, and losartan  was increased to 100 mg daily. Her home blood pressure readings have improved, with a recent reading of 124/83 mmHg.  She experienced a UTI two weeks ago and was treated at the [facility]. She is on daily Macrobid  and estradiol  cream for UTI prevention. The estradiol  cream has improved her sleep and reduced urinary frequency. A urine culture showed Klebsiella, treated with Keflex , resolving the infection.  She reports a sensation of a pulled muscle in her chest, sore to touch and  worsened by coughing, sneezing, or certain movements. The pain is primarily on the side and worsens when lying on her side. A similar episode occurred several months ago and resolved spontaneously.  She takes a mixture of turmeric and ginger as an anti-inflammatory supplement. She monitors for changes in joint pain or neuropathy symptoms, which include numbness but not pain.  She experiences a dry cough attributed to reflux, managed with daily medication. The cough is persistent and sometimes mistaken for sneezing by others.        Medications: Outpatient Medications Prior to Visit  Medication Sig   BIOTIN PO Take by mouth.   cephALEXin  (KEFLEX ) 500 MG capsule Take 1 capsule (500 mg total) by mouth 2 (two) times daily.   Cholecalciferol 25 MCG (1000 UT) tablet Take 1 tablet by mouth daily.   estradiol  (ESTRACE  VAGINAL) 0.1 MG/GM vaginal cream Place 1 Applicatorful vaginally 3 (three) times a week.   Ginger, Zingiber officinalis, (GINGER PO) Take by mouth.   losartan  (COZAAR ) 100 MG tablet Take 1 tablet (100 mg total) by mouth daily.   MAGNESIUM PO Take 1 capsule by mouth daily. 500mg    nitrofurantoin  (MACRODANTIN ) 100 MG capsule Take 1 capsule (100 mg total) by mouth daily.   omeprazole  (PRILOSEC) 40 MG capsule TAKE 1 CAPSULE (40 MG TOTAL) BY MOUTH DAILY.   Polyethyl Glycol-Propyl Glycol (SYSTANE OP) Apply 1 drop to eye 4 (four) times daily as needed.   TURMERIC PO Take 1 tablet by mouth daily. 500mg    [DISCONTINUED]  hydrochlorothiazide  (HYDRODIURIL ) 25 MG tablet Take 1 tablet (25 mg total) by mouth daily. (Patient not taking: Reported on 12/28/2023)   No facility-administered medications prior to visit.    Review of Systems     Objective    BP 124/83 Comment: home reading  Pulse 88   Ht 5' 1 (1.549 m)   Wt 120 lb 14.4 oz (54.8 kg)   SpO2 100%   BMI 22.84 kg/m    Physical Exam Vitals reviewed.  Constitutional:      General: She is not in acute distress.    Appearance:  Normal appearance. She is well-developed. She is not diaphoretic.  HENT:     Head: Normocephalic and atraumatic.  Eyes:     General: No scleral icterus.    Conjunctiva/sclera: Conjunctivae normal.  Neck:     Thyroid: No thyromegaly.  Cardiovascular:     Rate and Rhythm: Normal rate and regular rhythm.     Heart sounds: Normal heart sounds. No murmur heard. Pulmonary:     Effort: Pulmonary effort is normal. No respiratory distress.     Breath sounds: Normal breath sounds. No wheezing, rhonchi or rales.  Chest:     Comments: TTP over R lower costochondral junctures Musculoskeletal:     Cervical back: Neck supple.     Right lower leg: No edema.     Left lower leg: No edema.  Lymphadenopathy:     Cervical: No cervical adenopathy.  Skin:    General: Skin is warm and dry.     Findings: No rash.  Neurological:     Mental Status: She is alert and oriented to person, place, and time. Mental status is at baseline.  Psychiatric:        Mood and Affect: Mood normal.        Behavior: Behavior normal.      No results found for any visits on 12/28/23.  Assessment & Plan     Problem List Items Addressed This Visit       Cardiovascular and Mediastinum   Essential hypertension - Primary   Hypertension managed with losartan  50 mg and hydrochlorothiazide  25 mg daily. Recent lab results showed hyponatremia, likely due to hydrochlorothiazide . Hydrochlorothiazide  was discontinued, and losartan  was increased to 100 mg daily. Blood pressure readings at home are satisfactory, with a recent reading of 124/83 mmHg. Current sodium levels are being re-evaluated to assess improvement after medication adjustment. She consumes a high-salt diet, which may affect sodium levels. - Recheck sodium levels today. - Continue losartan  100 mg daily. - Monitor blood pressure at home. - Evaluate sodium levels and adjust treatment if necessary.      Relevant Orders   Basic metabolic panel with GFR      Digestive   GERD (gastroesophageal reflux disease)   Chronic cough likely due to GERD, which exacerbates costochondritis. Current management includes daily medication for reflux, but symptoms persist intermittently. - Continue current GERD management. - Monitor for changes in symptoms.        Nervous and Auditory   Sensory motor neuropathy   Other Visit Diagnoses       Hyponatremia       Relevant Orders   Basic metabolic panel with GFR     Costochondritis         Recurrent UTI               Recurrent urinary tract infections Recent UTI treated successfully with Keflex , which showed intermediate sensitivity but was effective. Estradiol  cream is  being used for UTI prevention, and she is on daily Macrobid . Discussion about applying estradiol  cream externally to aid in UTI prevention. Klebsiella was identified as the causative organism in the recent UTI. Considering discontinuation of daily Macrobid  to monitor for recurrence. - Continue estradiol  cream and apply externally as discussed. - Consider discontinuing daily Macrobid  and monitor for recurrence of UTI. - Ensure urine culture is obtained with future UTIs to guide antibiotic choice.  Costochondritis Pain localized to the area where the rib meets the breastbone, consistent with costochondritis. Symptoms include pain with deep breaths, coughing, and certain movements. Likely exacerbated by chronic cough from GERD. Previous episodes resolved spontaneously over time. - Apply ice to the affected area. - Take Aleve 1-2 tablets twice daily for one week. - Avoid activities that exacerbate symptoms, such as coughing and sneezing.  Peripheral neuropathy Peripheral neuropathy with numbness in the feet, not currently painful. She is taking turmeric and ginger as anti-inflammatory supplements, though no significant change noted yet. - Continue turmeric and ginger supplements.        Return for as scheduled.       Jon Eva, MD  [facility] [phone] (phone) [phone] (fax)  [facility]

## 2023-12-29 ENCOUNTER — Ambulatory Visit: Payer: Self-pay | Admitting: Family Medicine

## 2023-12-29 LAB — BASIC METABOLIC PANEL WITH GFR
BUN/Creatinine Ratio: 14 (ref 12–28)
BUN: 12 mg/dL (ref 8–27)
CO2: 24 mmol/L (ref 20–29)
Calcium: 9.7 mg/dL (ref 8.7–10.3)
Chloride: 95 mmol/L — ABNORMAL LOW (ref 96–106)
Creatinine, Ser: 0.86 mg/dL (ref 0.57–1.00)
Glucose: 110 mg/dL — ABNORMAL HIGH (ref 70–99)
Potassium: 4 mmol/L (ref 3.5–5.2)
Sodium: 133 mmol/L — ABNORMAL LOW (ref 134–144)
eGFR: 66 mL/min/1.73 (ref >59)

## 2023-12-30 ENCOUNTER — Ambulatory Visit

## 2024-01-05 ENCOUNTER — Other Ambulatory Visit: Payer: Self-pay | Admitting: Family Medicine

## 2024-01-05 DIAGNOSIS — I10 Essential (primary) hypertension: Secondary | ICD-10-CM

## 2024-01-06 ENCOUNTER — Ambulatory Visit: Admitting: Physical Therapy

## 2024-01-14 ENCOUNTER — Ambulatory Visit

## 2024-01-14 DIAGNOSIS — Z822 Family history of deafness and hearing loss: Secondary | ICD-10-CM | POA: Diagnosis not present

## 2024-01-14 DIAGNOSIS — Z1371 Encounter for nonprocreative screening for genetic disease carrier status: Secondary | ICD-10-CM | POA: Diagnosis not present

## 2024-01-14 DIAGNOSIS — Z8481 Family history of carrier of genetic disease: Secondary | ICD-10-CM | POA: Diagnosis not present

## 2024-01-14 DIAGNOSIS — H93012 Transient ischemic deafness, left ear: Secondary | ICD-10-CM | POA: Diagnosis not present

## 2024-01-14 DIAGNOSIS — Z1589 Genetic susceptibility to other disease: Secondary | ICD-10-CM | POA: Diagnosis not present

## 2024-01-15 ENCOUNTER — Other Ambulatory Visit: Payer: Self-pay | Admitting: Family Medicine

## 2024-01-20 ENCOUNTER — Ambulatory Visit: Admitting: Physical Therapy

## 2024-01-27 ENCOUNTER — Ambulatory Visit: Admitting: Physical Therapy

## 2024-03-14 ENCOUNTER — Telehealth: Payer: Self-pay

## 2024-03-14 NOTE — Telephone Encounter (Signed)
 Copied from CRM [id number]. Topic: Clinical - Prescription Issue >> Mar 14, 2024 12:20 PM Aleatha C wrote: Reason for CRM: Patient Would like a call to discuss estradiol  (ESTRACE  VAGINAL) 0.1 MG/GM vaginal cream from Dr Myrla

## 2024-03-15 MED ORDER — ESTRADIOL 10 MCG VA INST: 10.0000 ug | VAGINAL_INSERT | VAGINAL | 11 refills | Status: AC

## 2024-03-15 NOTE — Addendum Note (Signed)
 Addended by: MYRLA JON HERO on: 03/15/2024 04:42 PM   Modules accepted: Orders

## 2024-03-16 NOTE — Telephone Encounter (Signed)
 noted

## 2024-04-13 ENCOUNTER — Telehealth: Payer: Self-pay

## 2024-04-13 DIAGNOSIS — Z780 Asymptomatic menopausal state: Secondary | ICD-10-CM

## 2024-04-13 NOTE — Telephone Encounter (Signed)
 Copied from CRM [id number]. Topic: Clinical - Prescription Issue >> Apr 13, 2024  9:32 AM Zy'onna H wrote: Reason for CRM: Patient originally called in to schedule appt with PCP regarding her Estrogen pills. Medication: Estradiol  10 MCG INST  Upon her trying 2 different prescriptions she stated neither has worked.  In the DT the earliest opening for [facility] is 05/12/2024 -- the pt already has an appt in March.   Is there a way we can give her a call today to see what her options are for getting this resolved? She prefers calls after 1p.   Please Advise

## 2024-04-14 NOTE — Telephone Encounter (Signed)
 Pt okay with referral. Advised she should be contacted within 7 business day if no form of contact has been made to please make us  aware.

## 2024-04-14 NOTE — Addendum Note (Signed)
 Addended by: LILIAN SEVERO RAMAN on: 04/14/2024 10:05 AM   Modules accepted: Orders

## 2024-04-14 NOTE — Telephone Encounter (Signed)
 Please offer referral to GYN for further options - ok to send referral if she agrees

## 2024-05-19 ENCOUNTER — Ambulatory Visit: Admitting: Family Medicine

## 2024-05-23 ENCOUNTER — Ambulatory Visit: Admitting: Family Medicine
